# Patient Record
Sex: Female | Born: 1975 | Race: White | Hispanic: No | Marital: Married | Smoking: Never smoker
Health system: Southern US, Community
[De-identification: ages and names within clinical notes are randomized; demographics above are authoritative.]

## PROBLEM LIST (undated history)

## (undated) ENCOUNTER — Inpatient Hospital Stay (HOSPITAL_COMMUNITY): Payer: Self-pay

## (undated) DIAGNOSIS — N189 Chronic kidney disease, unspecified: Secondary | ICD-10-CM

## (undated) DIAGNOSIS — D649 Anemia, unspecified: Secondary | ICD-10-CM

## (undated) DIAGNOSIS — R87619 Unspecified abnormal cytological findings in specimens from cervix uteri: Secondary | ICD-10-CM

## (undated) DIAGNOSIS — T7840XA Allergy, unspecified, initial encounter: Secondary | ICD-10-CM

## (undated) DIAGNOSIS — N390 Urinary tract infection, site not specified: Secondary | ICD-10-CM

## (undated) DIAGNOSIS — R51 Headache: Secondary | ICD-10-CM

## (undated) DIAGNOSIS — M1990 Unspecified osteoarthritis, unspecified site: Secondary | ICD-10-CM

## (undated) DIAGNOSIS — R0902 Hypoxemia: Secondary | ICD-10-CM

## (undated) DIAGNOSIS — R569 Unspecified convulsions: Secondary | ICD-10-CM

## (undated) DIAGNOSIS — J45909 Unspecified asthma, uncomplicated: Secondary | ICD-10-CM

## (undated) DIAGNOSIS — R112 Nausea with vomiting, unspecified: Secondary | ICD-10-CM

## (undated) DIAGNOSIS — IMO0002 Reserved for concepts with insufficient information to code with codable children: Secondary | ICD-10-CM

## (undated) DIAGNOSIS — F419 Anxiety disorder, unspecified: Secondary | ICD-10-CM

## (undated) DIAGNOSIS — F32A Depression, unspecified: Secondary | ICD-10-CM

## (undated) DIAGNOSIS — K219 Gastro-esophageal reflux disease without esophagitis: Secondary | ICD-10-CM

## (undated) DIAGNOSIS — B2790 Infectious mononucleosis, unspecified without complication: Secondary | ICD-10-CM

## (undated) DIAGNOSIS — F329 Major depressive disorder, single episode, unspecified: Secondary | ICD-10-CM

## (undated) DIAGNOSIS — Z9889 Other specified postprocedural states: Secondary | ICD-10-CM

## (undated) HISTORY — DX: Anxiety disorder, unspecified: F41.9

## (undated) HISTORY — DX: Urinary tract infection, site not specified: N39.0

## (undated) HISTORY — DX: Unspecified convulsions: R56.9

## (undated) HISTORY — DX: Anemia, unspecified: D64.9

## (undated) HISTORY — DX: Allergy, unspecified, initial encounter: T78.40XA

## (undated) HISTORY — DX: Chronic kidney disease, unspecified: N18.9

## (undated) HISTORY — DX: Infectious mononucleosis, unspecified without complication: B27.90

## (undated) HISTORY — PX: COLPOSCOPY W/ BIOPSY / CURETTAGE: SUR283

## (undated) HISTORY — PX: DILATION AND CURETTAGE OF UTERUS: SHX78

## (undated) HISTORY — DX: Depression, unspecified: F32.A

## (undated) HISTORY — PX: FOOT FRACTURE SURGERY: SHX645

## (undated) HISTORY — PX: HAND SURGERY: SHX662

## (undated) HISTORY — DX: Hypoxemia: R09.02

## (undated) HISTORY — DX: Major depressive disorder, single episode, unspecified: F32.9

## (undated) HISTORY — PX: OTHER SURGICAL HISTORY: SHX169

---

## 1996-11-11 DIAGNOSIS — B2790 Infectious mononucleosis, unspecified without complication: Secondary | ICD-10-CM

## 1996-11-11 HISTORY — DX: Infectious mononucleosis, unspecified without complication: B27.90

## 2005-11-12 ENCOUNTER — Ambulatory Visit: Payer: Self-pay | Admitting: Orthopedic Surgery

## 2008-09-14 ENCOUNTER — Ambulatory Visit: Payer: Self-pay | Admitting: Family Medicine

## 2009-05-31 ENCOUNTER — Ambulatory Visit (HOSPITAL_COMMUNITY): Admission: RE | Admit: 2009-05-31 | Discharge: 2009-05-31 | Payer: Self-pay | Admitting: Obstetrics and Gynecology

## 2009-11-02 IMAGING — RF DG HYSTEROGRAM
4 series · 6 of 6 positions shown · IV contrast (omnipaque)
Comparison: none

CLINICAL DATA: Evaluate tubal patency

HYSTEROSALPINGOGRAM
TECHNIQUE: Following cleansing of the cervix and vagina with
Betadine solution, a hysterosalpingogram was performed using a 5-
French hysterosalpingogram catheter and Omnipaque 300 contrast. The
patient tolerated the examination without difficulty. The procedure
and imaging were performed by Dr. Romer without a radiologist
present.
Fluoroscopy time: 0.6 minutes.

[Series 1: run · 3 of 3 slices shown (1 of 4)]
[im 1/3]
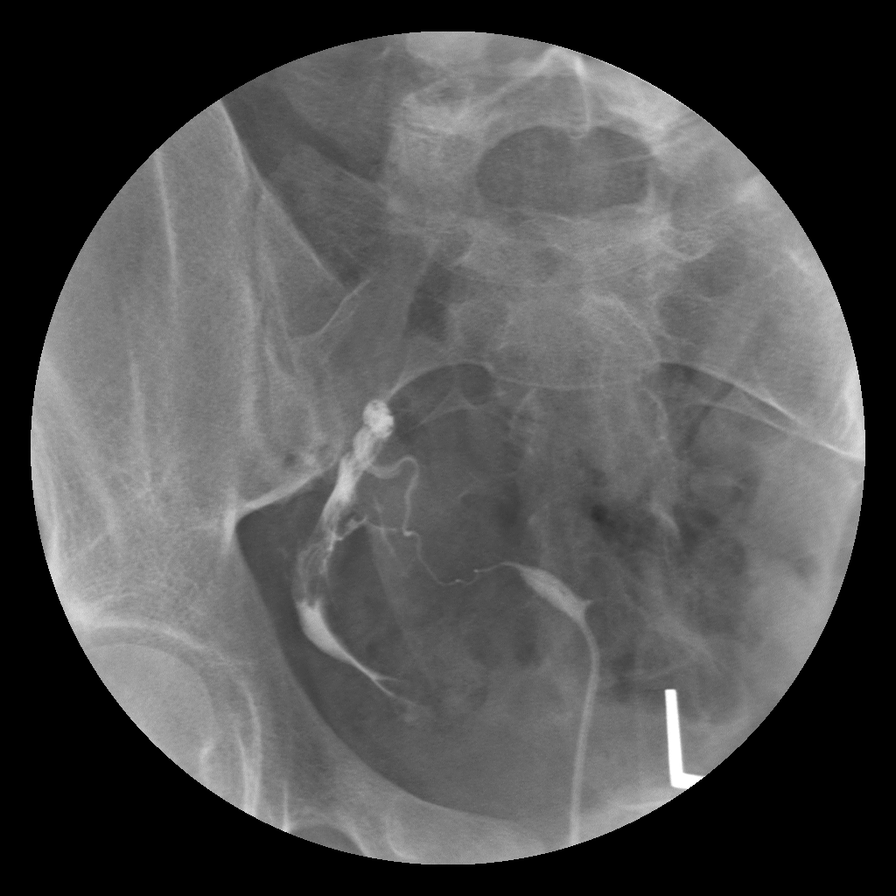
[im 2/3]
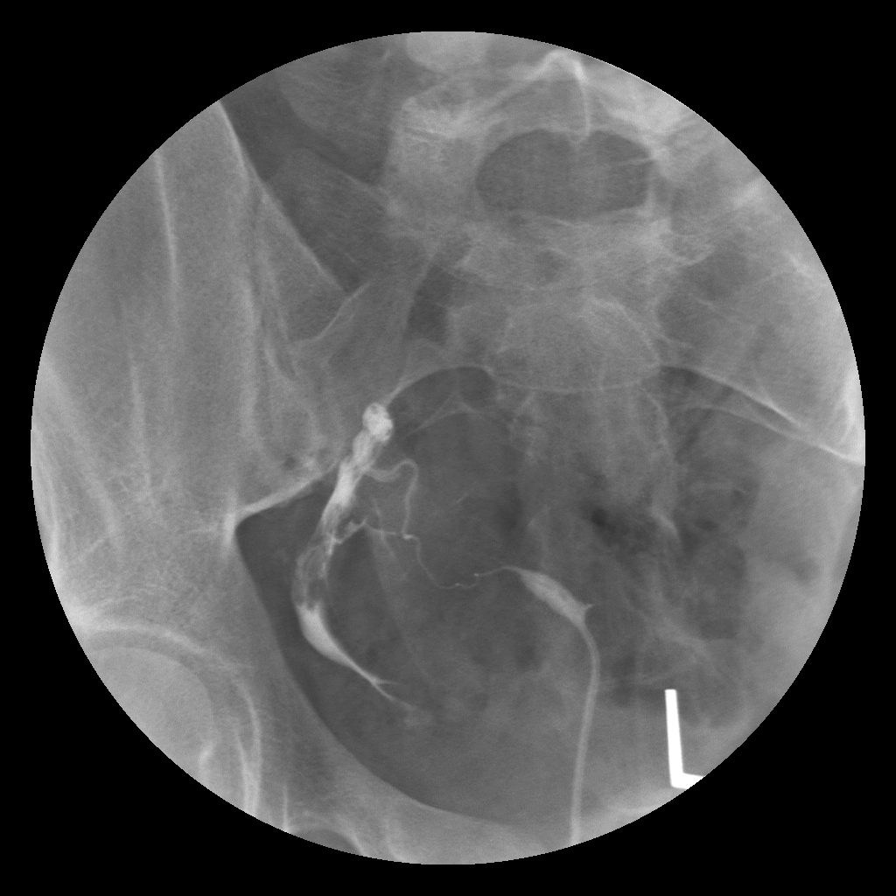
[im 3/3]
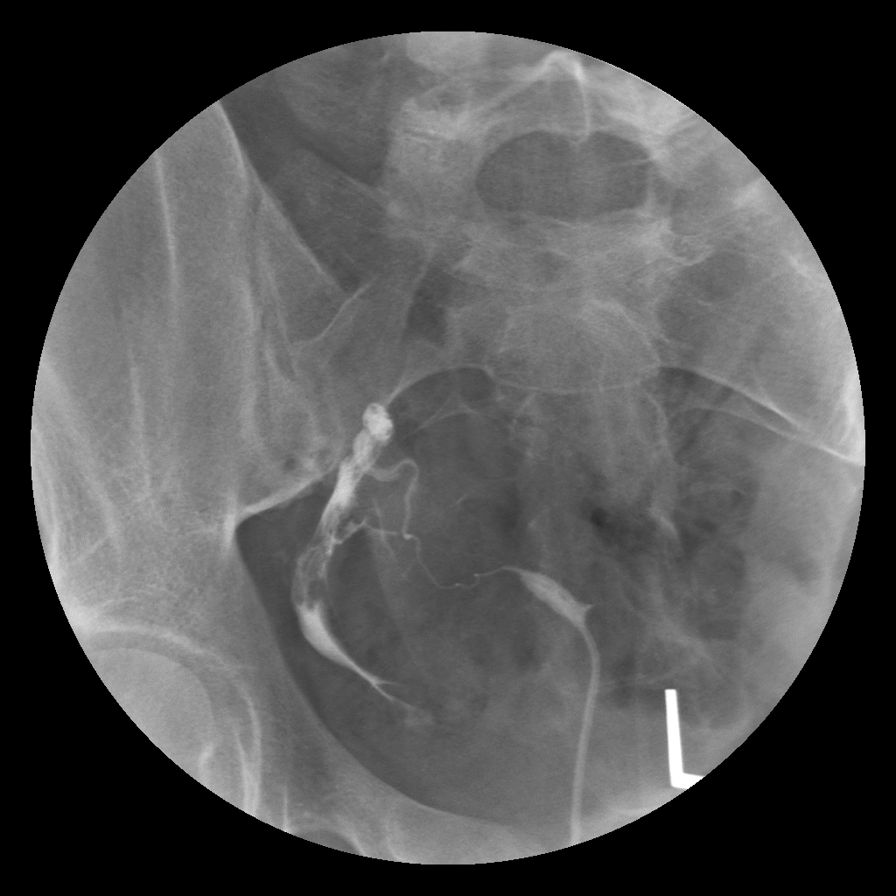

[Series 2: run · 1 of 1 slices shown (2 of 4)]
[im 1/1]
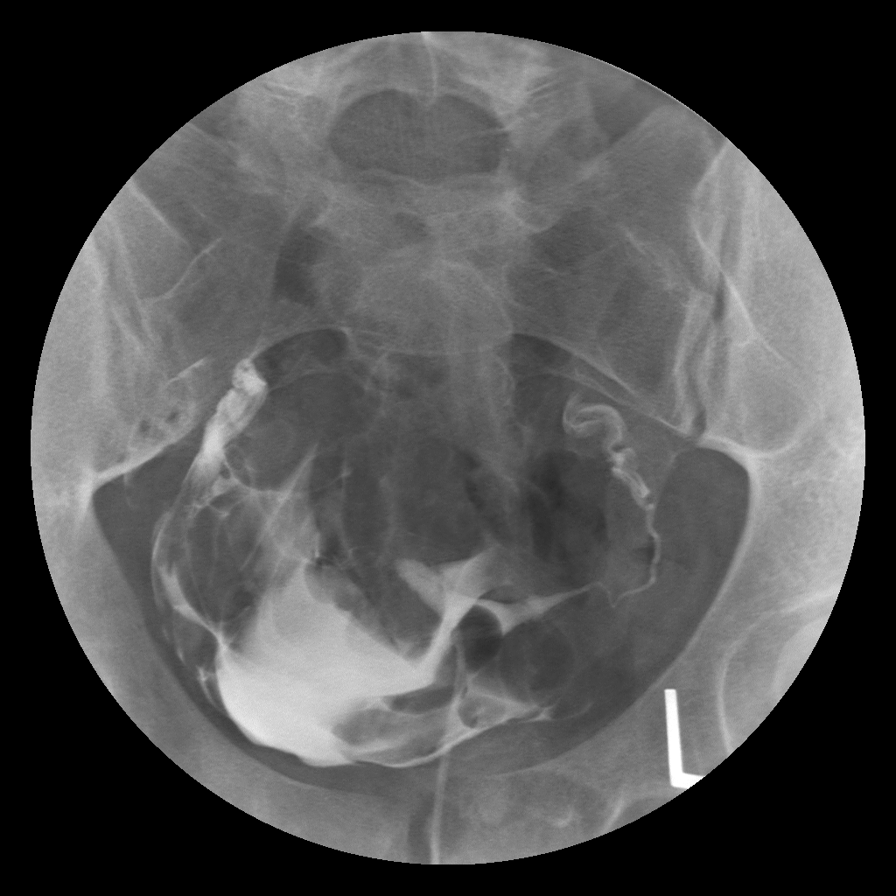

[Series 3: run · 1 of 1 slices shown (3 of 4)]
[im 1/1]
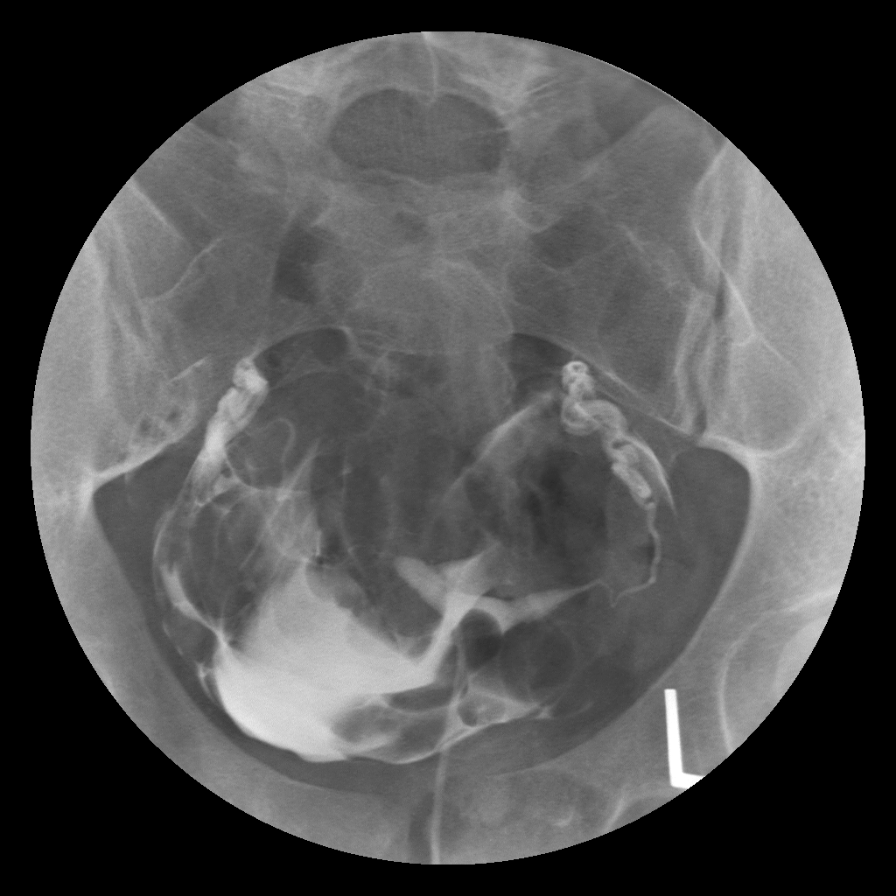

[Series 4: run · 1 of 1 slices shown (4 of 4)]
[im 1/1]
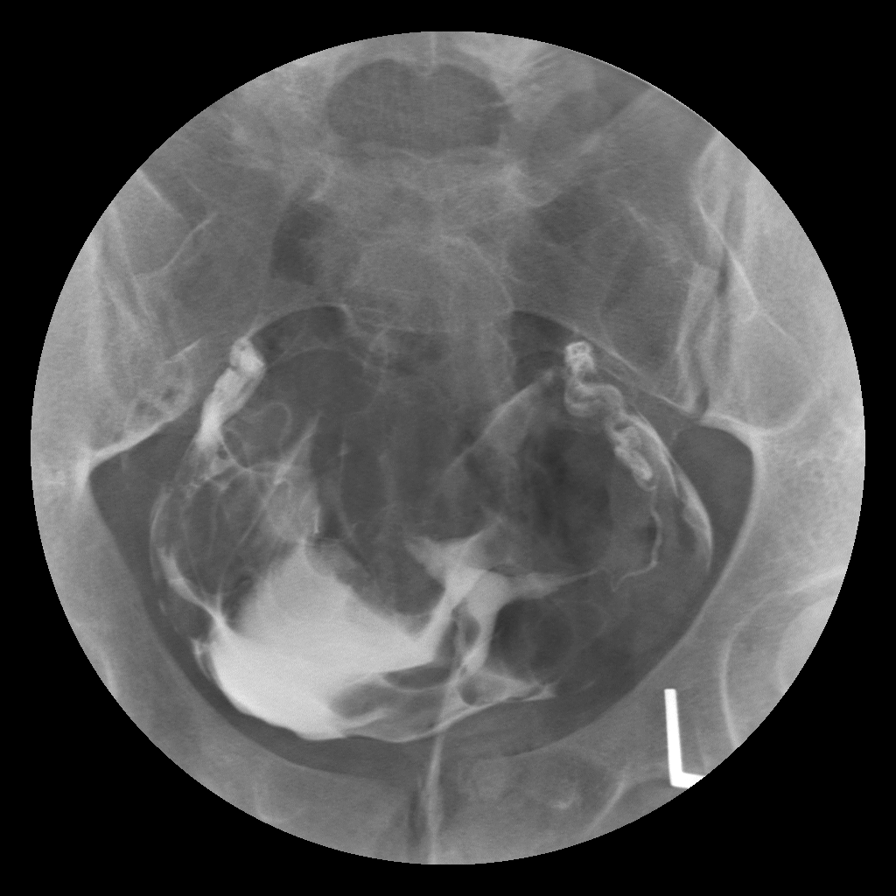

[6 of 6 positions shown; findings below may reference images not displayed]

FINDINGS: The right fallopian tube is normal in course and caliber
with free intraperitoneal spill noted on the right.  On subsequent
images, there is apparent free intraperitoneal spill of contrast
adjacent to the normal caliber left fallopian tube.  The
endometrial contour is normal.
IMPRESSION: Apparent bilateral tubal patency.

## 2009-12-05 ENCOUNTER — Ambulatory Visit: Payer: Self-pay | Admitting: Family Medicine

## 2009-12-30 ENCOUNTER — Ambulatory Visit: Payer: Self-pay | Admitting: Unknown Physician Specialty

## 2010-09-20 ENCOUNTER — Ambulatory Visit: Payer: Self-pay | Admitting: Family Medicine

## 2013-03-14 ENCOUNTER — Emergency Department (INDEPENDENT_AMBULATORY_CARE_PROVIDER_SITE_OTHER): Payer: BC Managed Care – PPO

## 2013-03-14 ENCOUNTER — Emergency Department (HOSPITAL_COMMUNITY)
Admission: EM | Admit: 2013-03-14 | Discharge: 2013-03-14 | Disposition: A | Discharge status: Home / Self Care | Payer: BC Managed Care – PPO | Source: Home / Self Care | Attending: Family Medicine | Admitting: Family Medicine

## 2013-03-14 ENCOUNTER — Encounter (HOSPITAL_COMMUNITY): Payer: Self-pay | Admitting: *Deleted

## 2013-03-14 DIAGNOSIS — S60222A Contusion of left hand, initial encounter: Secondary | ICD-10-CM

## 2013-03-14 DIAGNOSIS — S60229A Contusion of unspecified hand, initial encounter: Secondary | ICD-10-CM

## 2013-03-14 MED ORDER — DICLOFENAC POTASSIUM 50 MG PO TABS: 50.0000 mg | ORAL_TABLET | Freq: Three times a day (TID) | ORAL | Status: DC

## 2013-03-14 NOTE — ED Notes (Signed)
Patient states she hurt her left hand at [employer] last night while putting on a sweater.

## 2013-03-14 NOTE — ED Provider Notes (Signed)
History     CSN: [id number]  Arrival date & time 03/14/13  1407   First MD Initiated Contact with Patient 03/14/13 1446      Chief Complaint  Patient presents with  . Hand Injury    (Consider location/radiation/quality/duration/timing/severity/associated sxs/prior treatment) Patient is a 37 y.o. female presenting with hand injury. The history is provided by the patient.  Hand Injury Location:  Hand Time since incident:  1 day Injury: yes   Mechanism of injury comment:  Struck hand against bench while putting on sweater  Hand location:  L hand Pain details:    Quality:  Burning   Severity:  Mild   Progression:  Unchanged Dislocation: no   Relieved by:  Nothing   History reviewed. No pertinent past medical history.  History reviewed. No pertinent past surgical history.  No family history on file.  History  Substance Use Topics  . Smoking status: Never Smoker   . Smokeless tobacco: Not on file  . Alcohol Use: No    OB History   Grav Para Term Preterm Abortions TAB SAB Ect Mult Living   1               Review of Systems  Constitutional: Negative.   Musculoskeletal: Negative.  Negative for joint swelling.  Skin: Negative.     Allergies  Review of patient's allergies indicates no known allergies.  Home Medications   Current Outpatient Rx  Name  Route  Sig  Dispense  Refill  . cephALEXin (KEFLEX) 250 MG capsule   Oral   Take 250 mg by mouth 4 (four) times daily.         Marland Kitchen estradiol (ESTRACE) 2 MG tablet   Oral   Take 2 mg by mouth daily.         Marland Kitchen estradiol (VIVELLE-DOT) 0.1 MG/24HR   Transdermal   Place 1 patch onto the skin 2 (two) times a week.         . lansoprazole (PREVACID) 15 MG capsule   Oral   Take 15 mg by mouth daily.         . metFORMIN (GLUCOPHAGE) 500 MG tablet   Oral   Take 500 mg by mouth 3 (three) times daily.         . diclofenac (CATAFLAM) 50 MG tablet   Oral   Take 1 tablet (50 mg total) by mouth 3 (three)  times daily. Prn pain   21 tablet   0     BP 134/83  Pulse 94  Temp(Src) 98.5 F (36.9 C) (Oral)  Resp 19  SpO2 100%  Physical Exam  Nursing note and vitals reviewed. Constitutional: She is oriented to person, place, and time. She appears well-developed and well-nourished.  Musculoskeletal: She exhibits tenderness.       Hands: Neurological: She is alert and oriented to person, place, and time.  Skin: Skin is warm and dry.    ED Course  Procedures (including critical care time)  Labs Reviewed - No data to display Dg Hand Complete Left  03/14/2013  *RADIOLOGY REPORT*  Clinical Data: Left hand pain after trauma  LEFT HAND - COMPLETE 3+ VIEW  Comparison: None.  Findings: No fracture or dislocation is noted.  Joint spaces are intact. No soft tissue abnormality is noted.  IMPRESSION: Normal left hand.   Original Report Authenticated By: Lupita Raider.,  M.D.      1. Hand contusion, left, initial encounter       MDM  Linna Hoff, MD 03/14/13 [phone]

## 2013-04-19 LAB — OB RESULTS CONSOLE ABO/RH: RH Type: POSITIVE

## 2013-04-19 LAB — OB RESULTS CONSOLE HIV ANTIBODY (ROUTINE TESTING): HIV: NONREACTIVE

## 2013-06-01 ENCOUNTER — Encounter (HOSPITAL_COMMUNITY): Payer: Self-pay | Admitting: Obstetrics and Gynecology

## 2013-06-07 ENCOUNTER — Other Ambulatory Visit (HOSPITAL_COMMUNITY): Payer: Self-pay | Admitting: Obstetrics and Gynecology

## 2013-06-07 DIAGNOSIS — O289 Unspecified abnormal findings on antenatal screening of mother: Secondary | ICD-10-CM

## 2013-06-09 ENCOUNTER — Ambulatory Visit (HOSPITAL_COMMUNITY)
Admission: RE | Admit: 2013-06-09 | Discharge: 2013-06-09 | Disposition: A | Discharge status: Home / Self Care | Payer: BC Managed Care – PPO | Source: Ambulatory Visit | Attending: Obstetrics and Gynecology | Admitting: Obstetrics and Gynecology

## 2013-06-09 ENCOUNTER — Ambulatory Visit (HOSPITAL_COMMUNITY): Admission: RE | Admit: 2013-06-09 | Payer: BC Managed Care – PPO | Source: Ambulatory Visit

## 2013-06-09 ENCOUNTER — Other Ambulatory Visit: Payer: Self-pay

## 2013-06-09 ENCOUNTER — Encounter (HOSPITAL_COMMUNITY): Payer: Self-pay

## 2013-06-09 ENCOUNTER — Other Ambulatory Visit (HOSPITAL_COMMUNITY): Payer: Self-pay | Admitting: Obstetrics and Gynecology

## 2013-06-09 DIAGNOSIS — O280 Abnormal hematological finding on antenatal screening of mother: Secondary | ICD-10-CM

## 2013-06-09 DIAGNOSIS — O09529 Supervision of elderly multigravida, unspecified trimester: Secondary | ICD-10-CM | POA: Insufficient documentation

## 2013-06-09 DIAGNOSIS — IMO0002 Reserved for concepts with insufficient information to code with codable children: Secondary | ICD-10-CM | POA: Insufficient documentation

## 2013-06-09 DIAGNOSIS — Z1389 Encounter for screening for other disorder: Secondary | ICD-10-CM | POA: Insufficient documentation

## 2013-06-09 DIAGNOSIS — O3500X0 Maternal care for (suspected) central nervous system malformation or damage in fetus, unspecified, not applicable or unspecified: Secondary | ICD-10-CM | POA: Insufficient documentation

## 2013-06-09 DIAGNOSIS — O289 Unspecified abnormal findings on antenatal screening of mother: Secondary | ICD-10-CM

## 2013-06-09 DIAGNOSIS — O99210 Obesity complicating pregnancy, unspecified trimester: Secondary | ICD-10-CM | POA: Insufficient documentation

## 2013-06-09 DIAGNOSIS — E669 Obesity, unspecified: Secondary | ICD-10-CM | POA: Insufficient documentation

## 2013-06-09 DIAGNOSIS — O358XX0 Maternal care for other (suspected) fetal abnormality and damage, not applicable or unspecified: Secondary | ICD-10-CM | POA: Insufficient documentation

## 2013-06-09 DIAGNOSIS — O350XX0 Maternal care for (suspected) central nervous system malformation in fetus, not applicable or unspecified: Secondary | ICD-10-CM | POA: Insufficient documentation

## 2013-06-09 DIAGNOSIS — Z363 Encounter for antenatal screening for malformations: Secondary | ICD-10-CM | POA: Insufficient documentation

## 2013-06-10 ENCOUNTER — Other Ambulatory Visit: Payer: Self-pay

## 2013-06-18 ENCOUNTER — Telehealth (HOSPITAL_COMMUNITY): Payer: Self-pay | Admitting: MS"

## 2013-06-18 NOTE — Telephone Encounter (Signed)
Called Carrie [name] to discuss her cell free fetal DNA test results.  Mrs. Carrie [name] had Panorama testing through [facility].  Testing was offered because of advanced maternal age and previous discrepant NIPS result (from a different laboratory) with ultrasound findings.   The patient was identified by name and DOB.  We reviewed that these are within normal limits, showing a less than 1 in 10,000 risk for trisomies 21, 18 and 13, and monosomy X (Turner syndrome).  In addition, the risk for triploidy/vanishing twin and sex chromosome trisomies (47,XXX and 47,XXY) was also low risk.  We reviewed that this testing identifies > 99% of pregnancies with trisomy 86, trisomy 74, trisomy 25, sex chromosome trisomies (47,XXX and 47,XXY), and triploidy.  The detection rate for monosomy X is ~92%.  The false positive rate is <0.1% for all conditions. Testing was also consistent with female gender, which was consistent with the patient's ultrasound findings at the time of her appointment last week.  The patient did wish to know gender.  She understands that this testing does not identify all genetic conditions.  All questions were answered to her satisfaction, she was encouraged to call with additional questions or concerns.  Quinn Plowman, MS Certified Genetic Counselor 06/18/2013 4:42 PM

## 2013-06-21 ENCOUNTER — Other Ambulatory Visit (HOSPITAL_COMMUNITY): Payer: Self-pay | Admitting: Obstetrics and Gynecology

## 2013-06-21 DIAGNOSIS — O09529 Supervision of elderly multigravida, unspecified trimester: Secondary | ICD-10-CM

## 2013-06-21 DIAGNOSIS — O280 Abnormal hematological finding on antenatal screening of mother: Secondary | ICD-10-CM

## 2013-06-22 ENCOUNTER — Ambulatory Visit (HOSPITAL_COMMUNITY)
Admission: RE | Admit: 2013-06-22 | Discharge: 2013-06-22 | Disposition: A | Discharge status: Home / Self Care | Payer: BC Managed Care – PPO | Source: Ambulatory Visit | Attending: Obstetrics and Gynecology | Admitting: Obstetrics and Gynecology

## 2013-06-22 ENCOUNTER — Encounter (HOSPITAL_COMMUNITY): Payer: Self-pay

## 2013-06-22 ENCOUNTER — Ambulatory Visit (HOSPITAL_COMMUNITY): Payer: BC Managed Care – PPO

## 2013-06-22 DIAGNOSIS — IMO0002 Reserved for concepts with insufficient information to code with codable children: Secondary | ICD-10-CM | POA: Insufficient documentation

## 2013-06-22 DIAGNOSIS — O280 Abnormal hematological finding on antenatal screening of mother: Secondary | ICD-10-CM

## 2013-06-22 DIAGNOSIS — O99210 Obesity complicating pregnancy, unspecified trimester: Secondary | ICD-10-CM | POA: Insufficient documentation

## 2013-06-22 DIAGNOSIS — Z3689 Encounter for other specified antenatal screening: Secondary | ICD-10-CM | POA: Insufficient documentation

## 2013-06-22 DIAGNOSIS — O350XX0 Maternal care for (suspected) central nervous system malformation in fetus, not applicable or unspecified: Secondary | ICD-10-CM | POA: Insufficient documentation

## 2013-06-22 DIAGNOSIS — E669 Obesity, unspecified: Secondary | ICD-10-CM | POA: Insufficient documentation

## 2013-06-22 DIAGNOSIS — O09529 Supervision of elderly multigravida, unspecified trimester: Secondary | ICD-10-CM | POA: Insufficient documentation

## 2013-06-22 DIAGNOSIS — O3500X0 Maternal care for (suspected) central nervous system malformation or damage in fetus, unspecified, not applicable or unspecified: Secondary | ICD-10-CM | POA: Insufficient documentation

## 2013-09-07 ENCOUNTER — Inpatient Hospital Stay (HOSPITAL_COMMUNITY)
Admission: AD | Admit: 2013-09-07 | Discharge: 2013-09-07 | Disposition: A | Discharge status: Home / Self Care | Payer: BC Managed Care – PPO | Source: Ambulatory Visit | Attending: Obstetrics and Gynecology | Admitting: Obstetrics and Gynecology

## 2013-09-07 DIAGNOSIS — O4700 False labor before 37 completed weeks of gestation, unspecified trimester: Secondary | ICD-10-CM | POA: Insufficient documentation

## 2013-09-07 MED ORDER — BETAMETHASONE SOD PHOS & ACET 6 (3-3) MG/ML IJ SUSP
12.0000 mg | Freq: Once | INTRAMUSCULAR | Status: AC
  Administered 2013-09-07: 12 mg via INTRAMUSCULAR
  Filled 2013-09-07: qty 2

## 2013-09-08 ENCOUNTER — Inpatient Hospital Stay (HOSPITAL_COMMUNITY)
Admission: AD | Admit: 2013-09-08 | Discharge: 2013-09-08 | Disposition: A | Discharge status: Home / Self Care | Payer: BC Managed Care – PPO | Source: Ambulatory Visit | Attending: Obstetrics and Gynecology | Admitting: Obstetrics and Gynecology

## 2013-09-08 ENCOUNTER — Encounter (HOSPITAL_COMMUNITY): Payer: Self-pay | Admitting: *Deleted

## 2013-09-08 DIAGNOSIS — O4700 False labor before 37 completed weeks of gestation, unspecified trimester: Secondary | ICD-10-CM | POA: Insufficient documentation

## 2013-09-08 MED ORDER — BETAMETHASONE SOD PHOS & ACET 6 (3-3) MG/ML IJ SUSP
12.0000 mg | Freq: Once | INTRAMUSCULAR | Status: AC
  Administered 2013-09-08: 12 mg via INTRAMUSCULAR
  Filled 2013-09-08: qty 2

## 2013-09-13 ENCOUNTER — Encounter (HOSPITAL_COMMUNITY): Payer: Self-pay | Admitting: *Deleted

## 2013-09-13 ENCOUNTER — Other Ambulatory Visit (HOSPITAL_COMMUNITY): Payer: Self-pay | Admitting: Obstetrics and Gynecology

## 2013-09-13 ENCOUNTER — Inpatient Hospital Stay (HOSPITAL_COMMUNITY)
Admission: AD | Admit: 2013-09-13 | Discharge: 2013-09-13 | Disposition: A | Discharge status: Home / Self Care | Payer: BC Managed Care – PPO | Source: Ambulatory Visit | Attending: Obstetrics and Gynecology | Admitting: Obstetrics and Gynecology

## 2013-09-13 DIAGNOSIS — N949 Unspecified condition associated with female genital organs and menstrual cycle: Secondary | ICD-10-CM | POA: Insufficient documentation

## 2013-09-13 DIAGNOSIS — R102 Pelvic and perineal pain: Secondary | ICD-10-CM

## 2013-09-13 DIAGNOSIS — R109 Unspecified abdominal pain: Secondary | ICD-10-CM | POA: Insufficient documentation

## 2013-09-13 DIAGNOSIS — O09529 Supervision of elderly multigravida, unspecified trimester: Secondary | ICD-10-CM

## 2013-09-13 DIAGNOSIS — O26879 Cervical shortening, unspecified trimester: Secondary | ICD-10-CM | POA: Insufficient documentation

## 2013-09-13 DIAGNOSIS — O99891 Other specified diseases and conditions complicating pregnancy: Secondary | ICD-10-CM | POA: Insufficient documentation

## 2013-09-13 HISTORY — DX: Unspecified asthma, uncomplicated: J45.909

## 2013-09-13 HISTORY — DX: Unspecified osteoarthritis, unspecified site: M19.90

## 2013-09-13 HISTORY — DX: Reserved for concepts with insufficient information to code with codable children: IMO0002

## 2013-09-13 HISTORY — DX: Headache: R51

## 2013-09-13 HISTORY — DX: Unspecified abnormal cytological findings in specimens from cervix uteri: R87.619

## 2013-09-13 LAB — URINALYSIS, ROUTINE W REFLEX MICROSCOPIC
Bilirubin Urine: NEGATIVE
Glucose, UA: NEGATIVE mg/dL
Ketones, ur: 15 mg/dL — AB
Leukocytes, UA: NEGATIVE
Nitrite: NEGATIVE
Protein, ur: NEGATIVE mg/dL
Specific Gravity, Urine: 1.015 (ref 1.005–1.030)
Urobilinogen, UA: 0.2 mg/dL (ref 0.0–1.0)
pH: 6 (ref 5.0–8.0)

## 2013-09-13 LAB — URINE MICROSCOPIC-ADD ON

## 2013-09-13 LAB — WET PREP, GENITAL
Trich, Wet Prep: NONE SEEN
Yeast Wet Prep HPF POC: NONE SEEN

## 2013-09-13 NOTE — MAU Provider Note (Signed)
History     CSN: [id number]  Arrival date and time: 09/13/13 1733   First Provider Initiated Contact with Patient 09/13/13 1915      Chief Complaint  Patient presents with  . Abdominal Pain  . Vaginal Pain   HPI  Carrie [name] is 37 y.o. G2P0010 [redacted]w[redacted]d weeks presenting with sharp shooting vaginal pain on and off since 2 pm today.  Yesterday had upper right abdominal pain and continues today.  Denies nausea, vomiting, vaginal discharge or bleeding.  Last intercourse a while ago.   IVF cycle.  Patient of Dr. Marcelle Overlie, last Tuesday.  Saw Dr. Langston Masker last week and had FFN last week that was positive and negative AFFIRM.  Had U/S that showed shortened cervix.  Has appt in office tomorrow and one at MFM to evaluate head circumference.  U/S in office showed abdominal lag compared to head growth, normal AFI per patient report.    Past Medical History  Diagnosis Date  . Medical history non-contributory   . Asthma   . Headache(784.0)   . Abnormal Pap smear   . Arthritis     t-12    Past Surgical History  Procedure Laterality Date  . Colposcopy w/ biopsy / curettage    . Ivf      Family History  Problem Relation Age of Onset  . Arthritis Mother   . Diverticulitis Mother   . Arthritis Father   . Hypertension Father   . Diabetes Maternal Grandmother   . Heart disease Paternal Grandfather     History  Substance Use Topics  . Smoking status: Never Smoker   . Smokeless tobacco: Not on file  . Alcohol Use: No     Comment: not while pregnant    Allergies: No Known Allergies  Prescriptions prior to admission  Medication Sig Dispense Refill  . acetaminophen (TYLENOL) 500 MG tablet Take 1,000 mg by mouth every 6 (six) hours as needed for pain.      Marland Kitchen lansoprazole (PREVACID) 15 MG capsule Take 15 mg by mouth daily.      Marland Kitchen loratadine (CLARITIN) 10 MG tablet Take 10 mg by mouth daily.      . Prenatal Vit-Fe Fumarate-FA (PRENATAL MULTIVITAMIN) TABS tablet Take 1 tablet by mouth at  bedtime.         Review of Systems  Constitutional: Negative for fever and chills.  Gastrointestinal: Positive for abdominal pain (upper right abdominal pain). Negative for nausea and vomiting.  Genitourinary:       Vaginal pain  Neurological: Positive for headaches.   Physical Exam   Blood pressure 133/74, pulse 118, temperature 98.7 F (37.1 C), temperature source Oral, resp. rate 18, last menstrual period 02/07/2013.  Physical Exam  Constitutional: She is oriented to person, place, and time. She appears well-developed and well-nourished. No distress.  Genitourinary:  Cervical exam by Baxter Hire, RN  External os is 1 cm and the internal os is closed.    Neurological: She is alert and oriented to person, place, and time.  Psychiatric: She has a normal mood and affect. Her behavior is normal.   Results for orders placed during the hospital encounter of 09/13/13 (from the past 24 hour(s))  URINALYSIS, ROUTINE W REFLEX MICROSCOPIC     Status: Abnormal   Collection Time    09/13/13  5:55 PM      Result Value Range   Color, Urine YELLOW  YELLOW   APPearance CLEAR  CLEAR   Specific Gravity, Urine 1.015  1.005 -  1.030   pH 6.0  5.0 - 8.0   Glucose, UA NEGATIVE  NEGATIVE mg/dL   Hgb urine dipstick SMALL (*) NEGATIVE   Bilirubin Urine NEGATIVE  NEGATIVE   Ketones, ur 15 (*) NEGATIVE mg/dL   Protein, ur NEGATIVE  NEGATIVE mg/dL   Urobilinogen, UA 0.2  0.0 - 1.0 mg/dL   Nitrite NEGATIVE  NEGATIVE   Leukocytes, UA NEGATIVE  NEGATIVE  URINE MICROSCOPIC-ADD ON     Status: Abnormal   Collection Time    09/13/13  5:55 PM      Result Value Range   Squamous Epithelial / LPF FEW (*) RARE   WBC, UA 0-2  <3 WBC/hpf   RBC / HPF 3-6  <3 RBC/hpf   Bacteria, UA FEW (*) RARE  WET PREP, GENITAL     Status: Abnormal   Collection Time    09/13/13  7:46 PM      Result Value Range   Yeast Wet Prep HPF POC NONE SEEN  NONE SEEN   Trich, Wet Prep NONE SEEN  NONE SEEN   Clue Cells Wet Prep HPF POC  NONE SEEN  NONE SEEN   WBC, Wet Prep HPF POC FEW (*) NONE SEEN   MAU Course  Procedures  FMS-baseline 140, moderate variability, reactive  Neg for contractions  MDM Reported MSE, FMS to Dr. Rana Snare.  Order given to check her cervix and if needed a wet prep.   20:00 Care turned over to J. Ethier, PA Discussed cervical exam and wet prep results with Dr. Rana Snare. Ok for discharge. Increase PO hydration and follow-up as scheduled.  Assessment and Plan  A:  Vaginal pain at [redacted]w[redacted]d gestation       P: Discharge home Patient advised to increase PO hydration Patient encouraged to follow-up with [facility] as scheduled tomorrow Patient may return to MAU as needed or if her condition were to change or worsen    ETHIER, JULIE N. 09/13/2013, 8:15 PM   Freddi Starr, PA-C 09/13/2013 8:15 PM

## 2013-09-13 NOTE — MAU Note (Signed)
Pressure in RUQ, started yesterday. Today had sharp shooting pain in the vagina, last about , go away and then come back.

## 2013-09-14 ENCOUNTER — Other Ambulatory Visit (HOSPITAL_COMMUNITY): Payer: Self-pay | Admitting: Obstetrics and Gynecology

## 2013-09-14 ENCOUNTER — Ambulatory Visit (HOSPITAL_COMMUNITY)
Admission: RE | Admit: 2013-09-14 | Discharge: 2013-09-14 | Disposition: A | Discharge status: Home / Self Care | Payer: BC Managed Care – PPO | Source: Ambulatory Visit | Attending: Obstetrics and Gynecology | Admitting: Obstetrics and Gynecology

## 2013-09-14 VITALS — BP 146/89 | HR 119 | Wt 225.0 lb

## 2013-09-14 DIAGNOSIS — O3500X0 Maternal care for (suspected) central nervous system malformation or damage in fetus, unspecified, not applicable or unspecified: Secondary | ICD-10-CM | POA: Insufficient documentation

## 2013-09-14 DIAGNOSIS — O350XX0 Maternal care for (suspected) central nervous system malformation in fetus, not applicable or unspecified: Secondary | ICD-10-CM | POA: Insufficient documentation

## 2013-09-14 DIAGNOSIS — IMO0002 Reserved for concepts with insufficient information to code with codable children: Secondary | ICD-10-CM | POA: Insufficient documentation

## 2013-09-14 DIAGNOSIS — E669 Obesity, unspecified: Secondary | ICD-10-CM | POA: Insufficient documentation

## 2013-09-14 DIAGNOSIS — O09529 Supervision of elderly multigravida, unspecified trimester: Secondary | ICD-10-CM | POA: Insufficient documentation

## 2013-09-14 DIAGNOSIS — Z3689 Encounter for other specified antenatal screening: Secondary | ICD-10-CM | POA: Insufficient documentation

## 2013-09-24 ENCOUNTER — Ambulatory Visit (HOSPITAL_COMMUNITY)
Admission: RE | Admit: 2013-09-24 | Discharge: 2013-09-24 | Disposition: A | Discharge status: Home / Self Care | Payer: BC Managed Care – PPO | Source: Ambulatory Visit | Attending: Obstetrics and Gynecology | Admitting: Obstetrics and Gynecology

## 2013-09-24 ENCOUNTER — Other Ambulatory Visit (HOSPITAL_COMMUNITY): Payer: Self-pay | Admitting: Maternal and Fetal Medicine

## 2013-09-24 DIAGNOSIS — O3500X0 Maternal care for (suspected) central nervous system malformation or damage in fetus, unspecified, not applicable or unspecified: Secondary | ICD-10-CM | POA: Insufficient documentation

## 2013-09-24 DIAGNOSIS — O350XX0 Maternal care for (suspected) central nervous system malformation in fetus, not applicable or unspecified: Secondary | ICD-10-CM | POA: Insufficient documentation

## 2013-09-24 DIAGNOSIS — E669 Obesity, unspecified: Secondary | ICD-10-CM | POA: Insufficient documentation

## 2013-09-24 DIAGNOSIS — O09529 Supervision of elderly multigravida, unspecified trimester: Secondary | ICD-10-CM

## 2013-09-24 DIAGNOSIS — O365990 Maternal care for other known or suspected poor fetal growth, unspecified trimester, not applicable or unspecified: Secondary | ICD-10-CM | POA: Insufficient documentation

## 2013-09-24 DIAGNOSIS — IMO0002 Reserved for concepts with insufficient information to code with codable children: Secondary | ICD-10-CM | POA: Insufficient documentation

## 2013-09-24 NOTE — Progress Notes (Signed)
Toco/US applied for NST. 

## 2013-10-01 ENCOUNTER — Ambulatory Visit (HOSPITAL_COMMUNITY)
Admission: RE | Admit: 2013-10-01 | Discharge: 2013-10-01 | Disposition: A | Discharge status: Home / Self Care | Payer: BC Managed Care – PPO | Source: Ambulatory Visit | Attending: Obstetrics and Gynecology | Admitting: Obstetrics and Gynecology

## 2013-10-01 ENCOUNTER — Other Ambulatory Visit (HOSPITAL_COMMUNITY): Payer: Self-pay | Admitting: Maternal and Fetal Medicine

## 2013-10-01 DIAGNOSIS — O09529 Supervision of elderly multigravida, unspecified trimester: Secondary | ICD-10-CM | POA: Insufficient documentation

## 2013-10-01 DIAGNOSIS — O3500X0 Maternal care for (suspected) central nervous system malformation or damage in fetus, unspecified, not applicable or unspecified: Secondary | ICD-10-CM | POA: Insufficient documentation

## 2013-10-01 DIAGNOSIS — IMO0002 Reserved for concepts with insufficient information to code with codable children: Secondary | ICD-10-CM | POA: Insufficient documentation

## 2013-10-01 DIAGNOSIS — O365990 Maternal care for other known or suspected poor fetal growth, unspecified trimester, not applicable or unspecified: Secondary | ICD-10-CM | POA: Insufficient documentation

## 2013-10-01 DIAGNOSIS — E669 Obesity, unspecified: Secondary | ICD-10-CM | POA: Insufficient documentation

## 2013-10-01 DIAGNOSIS — O350XX0 Maternal care for (suspected) central nervous system malformation in fetus, not applicable or unspecified: Secondary | ICD-10-CM | POA: Insufficient documentation

## 2013-10-04 ENCOUNTER — Other Ambulatory Visit (HOSPITAL_COMMUNITY): Payer: Self-pay | Admitting: Maternal and Fetal Medicine

## 2013-10-04 DIAGNOSIS — O09529 Supervision of elderly multigravida, unspecified trimester: Secondary | ICD-10-CM

## 2013-10-05 ENCOUNTER — Ambulatory Visit (HOSPITAL_COMMUNITY)
Admission: RE | Admit: 2013-10-05 | Discharge: 2013-10-05 | Disposition: A | Discharge status: Home / Self Care | Payer: BC Managed Care – PPO | Source: Ambulatory Visit | Attending: Obstetrics & Gynecology | Admitting: Obstetrics & Gynecology

## 2013-10-05 DIAGNOSIS — O09529 Supervision of elderly multigravida, unspecified trimester: Secondary | ICD-10-CM | POA: Insufficient documentation

## 2013-10-05 DIAGNOSIS — O350XX0 Maternal care for (suspected) central nervous system malformation in fetus, not applicable or unspecified: Secondary | ICD-10-CM | POA: Insufficient documentation

## 2013-10-05 DIAGNOSIS — O3500X0 Maternal care for (suspected) central nervous system malformation or damage in fetus, unspecified, not applicable or unspecified: Secondary | ICD-10-CM | POA: Insufficient documentation

## 2013-10-05 DIAGNOSIS — IMO0002 Reserved for concepts with insufficient information to code with codable children: Secondary | ICD-10-CM | POA: Insufficient documentation

## 2013-10-05 DIAGNOSIS — E669 Obesity, unspecified: Secondary | ICD-10-CM | POA: Insufficient documentation

## 2013-10-05 DIAGNOSIS — O365990 Maternal care for other known or suspected poor fetal growth, unspecified trimester, not applicable or unspecified: Secondary | ICD-10-CM | POA: Insufficient documentation

## 2013-10-15 ENCOUNTER — Ambulatory Visit (HOSPITAL_COMMUNITY): Payer: BC Managed Care – PPO

## 2013-10-15 ENCOUNTER — Ambulatory Visit (HOSPITAL_COMMUNITY)
Admission: RE | Admit: 2013-10-15 | Discharge: 2013-10-15 | Disposition: A | Discharge status: Home / Self Care | Payer: BC Managed Care – PPO | Source: Ambulatory Visit | Attending: Maternal and Fetal Medicine | Admitting: Maternal and Fetal Medicine

## 2013-10-15 ENCOUNTER — Other Ambulatory Visit (HOSPITAL_COMMUNITY): Payer: Self-pay | Admitting: Maternal and Fetal Medicine

## 2013-10-15 DIAGNOSIS — O09529 Supervision of elderly multigravida, unspecified trimester: Secondary | ICD-10-CM | POA: Insufficient documentation

## 2013-10-15 DIAGNOSIS — IMO0002 Reserved for concepts with insufficient information to code with codable children: Secondary | ICD-10-CM | POA: Insufficient documentation

## 2013-10-15 DIAGNOSIS — O365990 Maternal care for other known or suspected poor fetal growth, unspecified trimester, not applicable or unspecified: Secondary | ICD-10-CM | POA: Insufficient documentation

## 2013-10-15 DIAGNOSIS — E669 Obesity, unspecified: Secondary | ICD-10-CM | POA: Insufficient documentation

## 2013-10-15 DIAGNOSIS — O3500X0 Maternal care for (suspected) central nervous system malformation or damage in fetus, unspecified, not applicable or unspecified: Secondary | ICD-10-CM | POA: Insufficient documentation

## 2013-10-15 DIAGNOSIS — O350XX0 Maternal care for (suspected) central nervous system malformation in fetus, not applicable or unspecified: Secondary | ICD-10-CM | POA: Insufficient documentation

## 2013-10-21 ENCOUNTER — Other Ambulatory Visit (HOSPITAL_COMMUNITY): Payer: Self-pay | Admitting: Maternal and Fetal Medicine

## 2013-10-21 DIAGNOSIS — O09529 Supervision of elderly multigravida, unspecified trimester: Secondary | ICD-10-CM

## 2013-10-22 ENCOUNTER — Ambulatory Visit (HOSPITAL_COMMUNITY)
Admission: RE | Admit: 2013-10-22 | Discharge: 2013-10-22 | Disposition: A | Discharge status: Home / Self Care | Payer: BC Managed Care – PPO | Source: Ambulatory Visit | Attending: Obstetrics and Gynecology | Admitting: Obstetrics and Gynecology

## 2013-10-22 VITALS — BP 129/88 | HR 123 | Wt 241.0 lb

## 2013-10-22 DIAGNOSIS — O350XX0 Maternal care for (suspected) central nervous system malformation in fetus, not applicable or unspecified: Secondary | ICD-10-CM | POA: Insufficient documentation

## 2013-10-22 DIAGNOSIS — IMO0002 Reserved for concepts with insufficient information to code with codable children: Secondary | ICD-10-CM | POA: Insufficient documentation

## 2013-10-22 DIAGNOSIS — O09819 Supervision of pregnancy resulting from assisted reproductive technology, unspecified trimester: Secondary | ICD-10-CM | POA: Insufficient documentation

## 2013-10-22 DIAGNOSIS — E669 Obesity, unspecified: Secondary | ICD-10-CM | POA: Insufficient documentation

## 2013-10-22 DIAGNOSIS — O09529 Supervision of elderly multigravida, unspecified trimester: Secondary | ICD-10-CM | POA: Insufficient documentation

## 2013-10-22 DIAGNOSIS — O365990 Maternal care for other known or suspected poor fetal growth, unspecified trimester, not applicable or unspecified: Secondary | ICD-10-CM | POA: Insufficient documentation

## 2013-10-22 DIAGNOSIS — O3500X0 Maternal care for (suspected) central nervous system malformation or damage in fetus, unspecified, not applicable or unspecified: Secondary | ICD-10-CM | POA: Insufficient documentation

## 2013-10-22 NOTE — Progress Notes (Signed)
Carrie [name] was seen for ultrasound appointment today.  Please see [facility] report for details.

## 2013-10-26 ENCOUNTER — Ambulatory Visit (HOSPITAL_COMMUNITY)
Admission: RE | Admit: 2013-10-26 | Discharge: 2013-10-26 | Disposition: A | Discharge status: Home / Self Care | Payer: BC Managed Care – PPO | Source: Ambulatory Visit | Attending: Maternal and Fetal Medicine | Admitting: Maternal and Fetal Medicine

## 2013-10-26 DIAGNOSIS — O09529 Supervision of elderly multigravida, unspecified trimester: Secondary | ICD-10-CM | POA: Insufficient documentation

## 2013-10-26 DIAGNOSIS — O365990 Maternal care for other known or suspected poor fetal growth, unspecified trimester, not applicable or unspecified: Secondary | ICD-10-CM | POA: Insufficient documentation

## 2013-10-26 DIAGNOSIS — O09819 Supervision of pregnancy resulting from assisted reproductive technology, unspecified trimester: Secondary | ICD-10-CM | POA: Insufficient documentation

## 2013-10-26 DIAGNOSIS — E669 Obesity, unspecified: Secondary | ICD-10-CM | POA: Insufficient documentation

## 2013-10-26 DIAGNOSIS — O3500X0 Maternal care for (suspected) central nervous system malformation or damage in fetus, unspecified, not applicable or unspecified: Secondary | ICD-10-CM | POA: Insufficient documentation

## 2013-10-26 DIAGNOSIS — O350XX0 Maternal care for (suspected) central nervous system malformation in fetus, not applicable or unspecified: Secondary | ICD-10-CM | POA: Insufficient documentation

## 2013-10-26 DIAGNOSIS — IMO0002 Reserved for concepts with insufficient information to code with codable children: Secondary | ICD-10-CM | POA: Insufficient documentation

## 2013-10-26 NOTE — Progress Notes (Signed)
Carrie [name]  was seen today for an ultrasound appointment.  See full report in [facility].  Impression: Single IUP at 37 2/7 weeks Follow up due to lagging growth, gestational hypertension Interval growth is appropriate (22nd %tile).  [facility] measures at the 29th %tile. Normal amniotic fluid volume.  BP: 149/97  Recommendations: Findings discussed with Dr. Marcelle Overlie.  He will see her in clinic this afternoon for further evaluation. If the patient does indeed meet criteria for gestational hypertension, would recommend induction of labor as soon as feasible (ACOG Task Force guidelines make little distinction between gestational hypertension and mild preeclampsia)  Alpha Gula, MD

## 2013-10-27 ENCOUNTER — Encounter (HOSPITAL_COMMUNITY): Payer: Self-pay | Admitting: Pharmacist

## 2013-10-27 ENCOUNTER — Other Ambulatory Visit: Payer: Self-pay | Admitting: Obstetrics and Gynecology

## 2013-10-27 NOTE — H&P (Signed)
Carrie [name]  DICTATION # [id number] CSN# [id number]   Meriel Pica, MD 10/27/2013 9:23 AM

## 2013-10-28 ENCOUNTER — Encounter (HOSPITAL_COMMUNITY)
Admission: RE | Admit: 2013-10-28 | Discharge: 2013-10-28 | Disposition: A | Discharge status: Home / Self Care | Payer: BC Managed Care – PPO | Source: Ambulatory Visit | Attending: Obstetrics and Gynecology | Admitting: Obstetrics and Gynecology

## 2013-10-28 ENCOUNTER — Encounter (HOSPITAL_COMMUNITY): Payer: Self-pay

## 2013-10-28 ENCOUNTER — Other Ambulatory Visit (HOSPITAL_COMMUNITY): Payer: Self-pay | Admitting: Obstetrics and Gynecology

## 2013-10-28 DIAGNOSIS — Z01812 Encounter for preprocedural laboratory examination: Secondary | ICD-10-CM | POA: Insufficient documentation

## 2013-10-28 DIAGNOSIS — O09529 Supervision of elderly multigravida, unspecified trimester: Secondary | ICD-10-CM

## 2013-10-28 DIAGNOSIS — Z01818 Encounter for other preprocedural examination: Secondary | ICD-10-CM | POA: Insufficient documentation

## 2013-10-28 HISTORY — DX: Nausea with vomiting, unspecified: R11.2

## 2013-10-28 HISTORY — DX: Other specified postprocedural states: Z98.890

## 2013-10-28 LAB — COMPREHENSIVE METABOLIC PANEL
AST: 22 U/L (ref 0–37)
Albumin: 2.4 g/dL — ABNORMAL LOW (ref 3.5–5.2)
Calcium: 9.3 mg/dL (ref 8.4–10.5)
Creatinine, Ser: 0.56 mg/dL (ref 0.50–1.10)
GFR calc non Af Amer: >90 mL/min (ref >90)
Total Protein: 5.8 g/dL — ABNORMAL LOW (ref 6.0–8.3)

## 2013-10-28 LAB — TYPE AND SCREEN
ABO/RH(D): A POS
Antibody Screen: NEGATIVE

## 2013-10-28 LAB — CBC
MCH: 29.2 pg (ref 26.0–34.0)
MCHC: 33.7 g/dL (ref 30.0–36.0)
MCV: 86.6 fL (ref 78.0–100.0)
Platelets: 285 10*3/uL (ref 150–400)
RDW: 14.4 % (ref 11.5–15.5)

## 2013-10-28 LAB — ABO/RH: ABO/RH(D): A POS

## 2013-10-28 NOTE — Patient Instructions (Signed)
20 Carrie [name]  10/28/2013   Your procedure is scheduled on:  10/30/13  Enter through the Main Entrance of [facility] at 730 AM.  Pick up the phone at the desk and dial [phone].   Call this number if you have problems the morning of surgery: [phone]   Remember:   Do not eat food:After Midnight.  Do not drink clear liquids: After Midnight.  Take these medicines the morning of surgery with A SIP OF WATER: Previcid   Do not wear jewelry, make-up or nail polish.  Do not wear lotions, powders, or perfumes. You may wear deodorant.  Do not shave 48 hours prior to surgery.  Do not bring valuables to the hospital.  [facility] is not   responsible for any belongings or valuables brought to the hospital.  Contacts, dentures or bridgework may not be worn into surgery.  Leave suitcase in the car. After surgery it may be brought to your room.  For patients admitted to the hospital, checkout time is 11:00 AM the day of              discharge.   Patients discharged the day of surgery will not be allowed to drive             home.  Name and phone number of your driver: NA  Special Instructions:   Shower using CHG 2 nights before surgery and the night before surgery.  If you shower the day of surgery use CHG.  Use special wash - you have one bottle of CHG for all showers.  You should use approximately 1/3 of the bottle for each shower.   Please read over the following fact sheets that you were given:   Surgical Site Infection Prevention

## 2013-10-29 ENCOUNTER — Ambulatory Visit (HOSPITAL_COMMUNITY): Payer: BC Managed Care – PPO

## 2013-10-29 MED ORDER — DEXTROSE 5 % IV SOLN
2.0000 g | INTRAVENOUS | Status: DC
  Filled 2013-10-29: qty 2

## 2013-10-29 NOTE — H&P (Signed)
[name], [name] NO.:  [id number]  MEDICAL RECORD NO.:  [id number]  LOCATION:                               FACILITY:  [facility]  PHYSICIAN:  Duke Salvia. Marcelle [name], ScottD.DATE OF BIRTH:  11-17-1975  DATE OF ADMISSION:  10/28/2013 DATE OF DISCHARGE:  10/28/2013                             HISTORY & PHYSICAL   CHIEF COMPLAINT:  Symptomatic preeclampsia at term, declines trial of labor, the patient preference for primary cesarean section, intrauterine growth restriction.  HISTORY OF PRESENT ILLNESS:  A 37 year old, G2, P0-0-1-0, IVF pregnancy with a disappearing twin, EDD is November 14, 2013, EGA is 38 weeks.  This patient, early in pregnancy, had a normal maternity 21 test with booking BP 110/70.  On her AFP only, she did have elevated AFP and had followup serial ultrasound by MFM.  There was a discrepancy in maternity 21 test, such that followup Panorama test was done that showed a 46 XX, negative screen for trisomy 34, 2, 45 and Turner syndrome.  One-hour GTT was 141, her 3-hour GTT was done that was normal.  She was evaluated at 30 weeks with a positive FFN.  She did receive outpatient betamethasone series when AFI was normal, but there was lagging growth by abdominal circumference.  Ultrasound early November showed asymmetrical IUGR with AC at the third percentile, normal AFI, BPP was normal.  She was being followed with twice weekly nonstress test, also being followed for serial growth ultrasound by MFM.  Late November or early December, she began to experience significant lower extremity edema.  PIH labs remained normal.  On October 26, 2013, followup MFM ultrasound showed improvement in the growth measurements, but she continued to experience headache, some visual disturbance, and 4+ lower extremity edema with elevations of her blood pressure, consistent with mild PIH.  Discussion with Dr. Harlon Flor recommended delivery after 37 weeks for mild preeclampsia  given her other symptoms. This patient was presented information on induction and stated she had a preference for delivery by cesarean section, understanding risks from both.  She now presents for primary cesarean section.  This procedure including specific risks related to bleeding, infection, transfusion, adjacent organ injury, wound infection, phlebitis along with her expected recovery time reviewed which she understands and accepts.  PAST MEDICAL HISTORY:  Please see the [facility] form for details.  PHYSICAL EXAMINATION:  VITAL SIGNS:  Temp 98.6, blood pressure 140/88. HEENT:  Unremarkable. NECK:  Supple without masses. LUNGS:  Clear. CARDIOVASCULAR:  Regular rate and rhythm without murmurs, rubs, or gallops. BREASTS:  Not examined. ABDOMEN:  Term fundal height.  Fetal heart rate 140, cervix was a fingertip, 50%, vertex, membranes intact. EXTREMITIES:  4+ pitting lower extremity edema, reflexes were 2+.  No clonus.  IMPRESSION: 1. A 38-week in vitro fertilization pregnancy. 2. Intrauterine growth restriction followed during this pregnancy by     MFM. 3. Mild preeclampsia. 4. The patient declines trial of labor.  Prefers primary cesarean     section for delivery.  PLAN:  Primary cesarean section, she understands she will need to be on magnesium sulfate for 24 hours after delivery.  Other risks discussed as above.  Carrie [name], ScottD.   ______________________________ Duke Salvia. Marcelle [name], ScottD.    Carrie  D:  10/27/2013  T:  10/28/2013  Job:  [name]

## 2013-10-30 ENCOUNTER — Encounter (HOSPITAL_COMMUNITY)
Admission: RE | Disposition: A | Discharge status: Home / Self Care | Payer: Self-pay | Source: Ambulatory Visit | Attending: Obstetrics and Gynecology

## 2013-10-30 ENCOUNTER — Encounter (HOSPITAL_COMMUNITY): Payer: BC Managed Care – PPO | Admitting: Anesthesiology

## 2013-10-30 ENCOUNTER — Inpatient Hospital Stay (HOSPITAL_COMMUNITY): Payer: BC Managed Care – PPO | Admitting: Anesthesiology

## 2013-10-30 ENCOUNTER — Encounter (HOSPITAL_COMMUNITY): Payer: Self-pay | Admitting: *Deleted

## 2013-10-30 ENCOUNTER — Inpatient Hospital Stay (HOSPITAL_COMMUNITY)
Admission: RE | Admit: 2013-10-30 | Discharge: 2013-11-02 | DRG: 765 | Disposition: A | Discharge status: Home / Self Care | Payer: BC Managed Care – PPO | Source: Ambulatory Visit | Attending: Obstetrics and Gynecology | Admitting: Obstetrics and Gynecology

## 2013-10-30 DIAGNOSIS — O365990 Maternal care for other known or suspected poor fetal growth, unspecified trimester, not applicable or unspecified: Secondary | ICD-10-CM | POA: Diagnosis present

## 2013-10-30 DIAGNOSIS — Z3490 Encounter for supervision of normal pregnancy, unspecified, unspecified trimester: Secondary | ICD-10-CM

## 2013-10-30 DIAGNOSIS — IMO0002 Reserved for concepts with insufficient information to code with codable children: Principal | ICD-10-CM | POA: Diagnosis present

## 2013-10-30 LAB — MRSA PCR SCREENING: MRSA by PCR: NEGATIVE

## 2013-10-30 SURGERY — Surgical Case
Anesthesia: Spinal | Site: Abdomen

## 2013-10-30 MED ORDER — ONDANSETRON HCL 4 MG/2ML IJ SOLN: 4.0000 mg | INTRAMUSCULAR | Status: DC | PRN

## 2013-10-30 MED ORDER — OXYTOCIN 40 UNITS IN LACTATED RINGERS INFUSION - SIMPLE MED: 62.5000 mL/h | INTRAVENOUS | Status: DC

## 2013-10-30 MED ORDER — PHENYLEPHRINE 8 MG IN D5W 100 ML (0.08MG/ML) PREMIX OPTIME
INJECTION | INTRAVENOUS | Status: DC | PRN
  Administered 2013-10-30: 60 ug/min via INTRAVENOUS

## 2013-10-30 MED ORDER — MORPHINE SULFATE 0.5 MG/ML IJ SOLN
INTRAMUSCULAR | Status: AC
  Filled 2013-10-30: qty 10

## 2013-10-30 MED ORDER — DEXTROSE 5 % IV SOLN
2.0000 g | INTRAVENOUS | Status: DC | PRN
  Administered 2013-10-30: 2 g via INTRAVENOUS

## 2013-10-30 MED ORDER — SODIUM CHLORIDE 0.9 % IJ SOLN: 3.0000 mL | Freq: Two times a day (BID) | INTRAMUSCULAR | Status: DC

## 2013-10-30 MED ORDER — DIPHENHYDRAMINE HCL 25 MG PO CAPS: 25.0000 mg | ORAL_CAPSULE | Freq: Four times a day (QID) | ORAL | Status: DC | PRN

## 2013-10-30 MED ORDER — LACTATED RINGERS IV SOLN: INTRAVENOUS | Status: DC

## 2013-10-30 MED ORDER — PROMETHAZINE HCL 25 MG/ML IJ SOLN
INTRAMUSCULAR | Status: AC
  Administered 2013-10-30: 12.5 mg via INTRAVENOUS
  Filled 2013-10-30: qty 1

## 2013-10-30 MED ORDER — KETOROLAC TROMETHAMINE 60 MG/2ML IM SOLN
INTRAMUSCULAR | Status: AC
  Administered 2013-10-30: 60 mg via INTRAMUSCULAR
  Filled 2013-10-30: qty 2

## 2013-10-30 MED ORDER — SCOPOLAMINE 1 MG/3DAYS TD PT72
1.0000 | MEDICATED_PATCH | Freq: Once | TRANSDERMAL | Status: DC
  Administered 2013-10-30: 1.5 mg via TRANSDERMAL

## 2013-10-30 MED ORDER — DIPHENHYDRAMINE HCL 50 MG/ML IJ SOLN: 12.5000 mg | INTRAMUSCULAR | Status: DC | PRN

## 2013-10-30 MED ORDER — OXYTOCIN 10 UNIT/ML IJ SOLN
40.0000 [IU] | INTRAVENOUS | Status: DC | PRN
  Administered 2013-10-30: 40 [IU] via INTRAVENOUS

## 2013-10-30 MED ORDER — ONDANSETRON HCL 4 MG/2ML IJ SOLN
INTRAMUSCULAR | Status: DC | PRN
  Administered 2013-10-30: 4 mg via INTRAVENOUS

## 2013-10-30 MED ORDER — SODIUM CHLORIDE 0.9 % IJ SOLN: 3.0000 mL | INTRAMUSCULAR | Status: DC | PRN

## 2013-10-30 MED ORDER — DEXTROSE 5 % IV SOLN
1.0000 ug/kg/h | INTRAVENOUS | Status: DC | PRN
  Filled 2013-10-30: qty 2

## 2013-10-30 MED ORDER — FLEET ENEMA 7-19 GM/118ML RE ENEM: 1.0000 | ENEMA | Freq: Every day | RECTAL | Status: DC | PRN

## 2013-10-30 MED ORDER — DEXAMETHASONE SODIUM PHOSPHATE 4 MG/ML IJ SOLN
8.0000 mg | Freq: Once | INTRAMUSCULAR | Status: AC | PRN
  Administered 2013-10-30: 8 mg via INTRAVENOUS

## 2013-10-30 MED ORDER — DIBUCAINE 1 % RE OINT: 1.0000 "application " | TOPICAL_OINTMENT | RECTAL | Status: DC | PRN

## 2013-10-30 MED ORDER — ONDANSETRON HCL 4 MG PO TABS: 4.0000 mg | ORAL_TABLET | ORAL | Status: DC | PRN

## 2013-10-30 MED ORDER — OXYCODONE-ACETAMINOPHEN 5-325 MG PO TABS
1.0000 | ORAL_TABLET | Freq: Four times a day (QID) | ORAL | Status: DC | PRN
Start: 2013-10-30 — End: 2013-10-31
  Administered 2013-10-31: 1 via ORAL
  Filled 2013-10-30: qty 1

## 2013-10-30 MED ORDER — IBUPROFEN 800 MG PO TABS
800.0000 mg | ORAL_TABLET | Freq: Three times a day (TID) | ORAL | Status: DC | PRN
  Administered 2013-10-31: 800 mg via ORAL
  Filled 2013-10-30: qty 1

## 2013-10-30 MED ORDER — LACTATED RINGERS IV SOLN
INTRAVENOUS | Status: DC
  Administered 2013-10-30 (×2): via INTRAVENOUS

## 2013-10-30 MED ORDER — SODIUM CHLORIDE 0.9 % IV SOLN: 250.0000 mL | INTRAVENOUS | Status: DC

## 2013-10-30 MED ORDER — PHENYLEPHRINE HCL 10 MG/ML IJ SOLN
INTRAMUSCULAR | Status: DC | PRN
  Administered 2013-10-30 (×2): 40 ug via INTRAVENOUS

## 2013-10-30 MED ORDER — SIMETHICONE 80 MG PO CHEW: 80.0000 mg | CHEWABLE_TABLET | ORAL | Status: DC | PRN

## 2013-10-30 MED ORDER — BISACODYL 10 MG RE SUPP: 10.0000 mg | Freq: Every day | RECTAL | Status: DC | PRN

## 2013-10-30 MED ORDER — NALBUPHINE HCL 10 MG/ML IJ SOLN
5.0000 mg | INTRAMUSCULAR | Status: DC | PRN
  Filled 2013-10-30: qty 1

## 2013-10-30 MED ORDER — MIDAZOLAM HCL 2 MG/2ML IJ SOLN
INTRAMUSCULAR | Status: AC
  Filled 2013-10-30: qty 2

## 2013-10-30 MED ORDER — ONDANSETRON HCL 4 MG/2ML IJ SOLN
INTRAMUSCULAR | Status: AC
  Filled 2013-10-30: qty 2

## 2013-10-30 MED ORDER — MAGNESIUM SULFATE 40 G IN LACTATED RINGERS - SIMPLE
2.0000 g/h | INTRAVENOUS | Status: DC
  Administered 2013-10-30: 2 g/h via INTRAVENOUS
  Filled 2013-10-30: qty 500

## 2013-10-30 MED ORDER — FENTANYL CITRATE 0.05 MG/ML IJ SOLN: 25.0000 ug | INTRAMUSCULAR | Status: DC | PRN

## 2013-10-30 MED ORDER — WITCH HAZEL-GLYCERIN EX PADS: 1.0000 "application " | MEDICATED_PAD | CUTANEOUS | Status: DC | PRN

## 2013-10-30 MED ORDER — DIPHENHYDRAMINE HCL 25 MG PO CAPS: 25.0000 mg | ORAL_CAPSULE | ORAL | Status: DC | PRN

## 2013-10-30 MED ORDER — PRENATAL MULTIVITAMIN CH
1.0000 | ORAL_TABLET | Freq: Every day | ORAL | Status: DC
  Administered 2013-10-30: 1 via ORAL
  Filled 2013-10-30: qty 1

## 2013-10-30 MED ORDER — DEXAMETHASONE SODIUM PHOSPHATE 4 MG/ML IJ SOLN
INTRAMUSCULAR | Status: AC
  Filled 2013-10-30: qty 2

## 2013-10-30 MED ORDER — KETOROLAC TROMETHAMINE 60 MG/2ML IM SOLN
60.0000 mg | Freq: Once | INTRAMUSCULAR | Status: AC | PRN
  Administered 2013-10-30: 60 mg via INTRAMUSCULAR

## 2013-10-30 MED ORDER — DIPHENHYDRAMINE HCL 50 MG/ML IJ SOLN: 25.0000 mg | INTRAMUSCULAR | Status: DC | PRN

## 2013-10-30 MED ORDER — METOCLOPRAMIDE HCL 5 MG/ML IJ SOLN
10.0000 mg | Freq: Three times a day (TID) | INTRAMUSCULAR | Status: DC | PRN
  Administered 2013-10-30: 10 mg via INTRAVENOUS

## 2013-10-30 MED ORDER — SCOPOLAMINE 1 MG/3DAYS TD PT72
1.0000 | MEDICATED_PATCH | Freq: Once | TRANSDERMAL | Status: DC
  Filled 2013-10-30: qty 1

## 2013-10-30 MED ORDER — MAGNESIUM SULFATE BOLUS VIA INFUSION
4.0000 g | Freq: Once | INTRAVENOUS | Status: AC
  Administered 2013-10-30: 4 g via INTRAVENOUS
  Filled 2013-10-30: qty 500

## 2013-10-30 MED ORDER — SIMETHICONE 80 MG PO CHEW: 80.0000 mg | CHEWABLE_TABLET | ORAL | Status: DC

## 2013-10-30 MED ORDER — LACTATED RINGERS IV SOLN: Freq: Once | INTRAVENOUS | Status: DC

## 2013-10-30 MED ORDER — KETOROLAC TROMETHAMINE 30 MG/ML IJ SOLN: 30.0000 mg | Freq: Four times a day (QID) | INTRAMUSCULAR | Status: DC | PRN

## 2013-10-30 MED ORDER — ZOLPIDEM TARTRATE 5 MG PO TABS: 5.0000 mg | ORAL_TABLET | Freq: Every evening | ORAL | Status: DC | PRN

## 2013-10-30 MED ORDER — METOCLOPRAMIDE HCL 5 MG/ML IJ SOLN
INTRAMUSCULAR | Status: AC
  Administered 2013-10-30: 10 mg via INTRAVENOUS
  Filled 2013-10-30: qty 2

## 2013-10-30 MED ORDER — FENTANYL CITRATE 0.05 MG/ML IJ SOLN
INTRAMUSCULAR | Status: DC | PRN
  Administered 2013-10-30: 50 ug via INTRAVENOUS
  Administered 2013-10-30: 12.5 ug via INTRATHECAL

## 2013-10-30 MED ORDER — SCOPOLAMINE 1 MG/3DAYS TD PT72
MEDICATED_PATCH | TRANSDERMAL | Status: AC
  Filled 2013-10-30: qty 1

## 2013-10-30 MED ORDER — OXYTOCIN 10 UNIT/ML IJ SOLN
INTRAMUSCULAR | Status: AC
  Filled 2013-10-30: qty 4

## 2013-10-30 MED ORDER — BUPIVACAINE IN DEXTROSE 0.75-8.25 % IT SOLN
INTRATHECAL | Status: DC | PRN
  Administered 2013-10-30: 1.5 mL via INTRATHECAL

## 2013-10-30 MED ORDER — FENTANYL CITRATE 0.05 MG/ML IJ SOLN
INTRAMUSCULAR | Status: AC
  Filled 2013-10-30: qty 2

## 2013-10-30 MED ORDER — NALOXONE HCL 0.4 MG/ML IJ SOLN: 0.4000 mg | INTRAMUSCULAR | Status: DC | PRN

## 2013-10-30 MED ORDER — MORPHINE SULFATE (PF) 0.5 MG/ML IJ SOLN
INTRAMUSCULAR | Status: DC | PRN
  Administered 2013-10-30: .1 mg via INTRATHECAL

## 2013-10-30 MED ORDER — PROMETHAZINE HCL 25 MG/ML IJ SOLN
6.2500 mg | INTRAMUSCULAR | Status: DC | PRN
  Administered 2013-10-30: 12.5 mg via INTRAVENOUS

## 2013-10-30 MED ORDER — LANOLIN HYDROUS EX OINT: 1.0000 "application " | TOPICAL_OINTMENT | CUTANEOUS | Status: DC | PRN

## 2013-10-30 MED ORDER — TETANUS-DIPHTH-ACELL PERTUSSIS 5-2.5-18.5 LF-MCG/0.5 IM SUSP
0.5000 mL | Freq: Once | INTRAMUSCULAR | Status: DC
  Filled 2013-10-30: qty 0.5

## 2013-10-30 MED ORDER — DEXTROSE IN LACTATED RINGERS 5 % IV SOLN
INTRAVENOUS | Status: DC
  Administered 2013-10-30: 23:00:00 via INTRAVENOUS

## 2013-10-30 MED ORDER — SIMETHICONE 80 MG PO CHEW
80.0000 mg | CHEWABLE_TABLET | Freq: Three times a day (TID) | ORAL | Status: DC
  Administered 2013-10-30 – 2013-10-31 (×2): 80 mg via ORAL
  Filled 2013-10-30 (×2): qty 1

## 2013-10-30 MED ORDER — ONDANSETRON HCL 4 MG/2ML IJ SOLN
4.0000 mg | Freq: Three times a day (TID) | INTRAMUSCULAR | Status: DC | PRN
  Administered 2013-10-30: 4 mg via INTRAVENOUS
  Filled 2013-10-30: qty 2

## 2013-10-30 MED ORDER — MEASLES, MUMPS & RUBELLA VAC ~~LOC~~ INJ: 0.5000 mL | INJECTION | Freq: Once | SUBCUTANEOUS | Status: DC

## 2013-10-30 MED ORDER — MEPERIDINE HCL 25 MG/ML IJ SOLN: 6.2500 mg | INTRAMUSCULAR | Status: DC | PRN

## 2013-10-30 MED ORDER — MENTHOL 3 MG MT LOZG: 1.0000 | LOZENGE | OROMUCOSAL | Status: DC | PRN

## 2013-10-30 MED ORDER — SENNOSIDES-DOCUSATE SODIUM 8.6-50 MG PO TABS
2.0000 | ORAL_TABLET | ORAL | Status: DC
  Administered 2013-10-30: 2 via ORAL
  Filled 2013-10-30: qty 2

## 2013-10-30 MED ORDER — PHENYLEPHRINE 8 MG IN D5W 100 ML (0.08MG/ML) PREMIX OPTIME
INJECTION | INTRAVENOUS | Status: AC
  Filled 2013-10-30: qty 100

## 2013-10-30 SURGICAL SUPPLY — 27 items
BENZOIN TINCTURE PRP APPL 2/3 (GAUZE/BANDAGES/DRESSINGS) ×2 IMPLANT
CLAMP CORD UMBIL (MISCELLANEOUS) IMPLANT
CLOTH BEACON ORANGE TIMEOUT ST (SAFETY) ×2 IMPLANT
DRAPE LG THREE QUARTER DISP (DRAPES) IMPLANT
DRSG OPSITE POSTOP 4X10 (GAUZE/BANDAGES/DRESSINGS) ×2 IMPLANT
DURAPREP 26ML APPLICATOR (WOUND CARE) ×2 IMPLANT
ELECT REM PT RETURN 9FT ADLT (ELECTROSURGICAL) ×2
ELECTRODE REM PT RTRN 9FT ADLT (ELECTROSURGICAL) ×1 IMPLANT
EXTRACTOR VACUUM M CUP 4 TUBE (SUCTIONS) IMPLANT
GLOVE BIO SURGEON STRL SZ7 (GLOVE) ×2 IMPLANT
GOWN PREVENTION PLUS XLARGE (GOWN DISPOSABLE) ×4 IMPLANT
GOWN STRL REIN XL XLG (GOWN DISPOSABLE) ×4 IMPLANT
KIT ABG SYR 3ML LUER SLIP (SYRINGE) IMPLANT
NEEDLE HYPO 25X5/8 SAFETYGLIDE (NEEDLE) ×2 IMPLANT
NS IRRIG 1000ML POUR BTL (IV SOLUTION) ×2 IMPLANT
PACK C SECTION WH (CUSTOM PROCEDURE TRAY) ×2 IMPLANT
PAD OB MATERNITY 4.3X12.25 (PERSONAL CARE ITEMS) ×2 IMPLANT
STRIP CLOSURE SKIN 1/2X4 (GAUZE/BANDAGES/DRESSINGS) ×2 IMPLANT
STRIP CLOSURE SKIN 1/4X4 (GAUZE/BANDAGES/DRESSINGS) ×2 IMPLANT
SUT CHROMIC 0 CTX 36 (SUTURE) ×6 IMPLANT
SUT MON AB 4-0 PS1 27 (SUTURE) ×2 IMPLANT
SUT PDS AB 0 CT1 27 (SUTURE) ×4 IMPLANT
SUT VIC AB 3-0 CT1 27 (SUTURE) ×2
SUT VIC AB 3-0 CT1 TAPERPNT 27 (SUTURE) ×2 IMPLANT
TOWEL OR 17X24 6PK STRL BLUE (TOWEL DISPOSABLE) ×2 IMPLANT
TRAY FOLEY CATH 14FR (SET/KITS/TRAYS/PACK) ×2 IMPLANT
WATER STERILE IRR 1000ML POUR (IV SOLUTION) ×2 IMPLANT

## 2013-10-30 NOTE — Progress Notes (Signed)
The patient was re-examined with no change in status 

## 2013-10-30 NOTE — Anesthesia Preprocedure Evaluation (Addendum)
Anesthesia Evaluation  Patient identified by MRN, date of birth, ID band Patient awake    Reviewed: Allergy & Precautions, H&P , NPO status , Patient's Chart, lab work & pertinent test results  History of Anesthesia Complications (+) PONV and history of anesthetic complications  Airway Mallampati: II TM Distance: >3 FB Neck ROM: Full    Dental no notable dental hx.    Pulmonary asthma ,  breath sounds clear to auscultation  Pulmonary exam normal       Cardiovascular negative cardio ROS  Rhythm:Regular Rate:Normal     Neuro/Psych negative neurological ROS  negative psych ROS   GI/Hepatic negative GI ROS, Neg liver ROS,   Endo/Other  Morbid obesity  Renal/GU negative Renal ROS  negative genitourinary   Musculoskeletal negative musculoskeletal ROS (+)   Abdominal   Peds negative pediatric ROS (+)  Hematology negative hematology ROS (+)   Anesthesia Other Findings   Reproductive/Obstetrics (+) Pregnancy Preeclampsia                           Anesthesia Physical Anesthesia Plan  ASA: III  Anesthesia Plan: Spinal   Post-op Pain Management:    Induction:   Airway Management Planned: Natural Airway  Additional Equipment:   Intra-op Plan:   Post-operative Plan:   Informed Consent: I have reviewed the patients History and Physical, chart, labs and discussed the procedure including the risks, benefits and alternatives for the proposed anesthesia with the patient or authorized representative who has indicated his/her understanding and acceptance.     Plan Discussed with:   Anesthesia Plan Comments:         Anesthesia Quick Evaluation

## 2013-10-30 NOTE — Anesthesia Postprocedure Evaluation (Signed)
  Anesthesia Post-op Note  Patient: Carrie [name]  Procedure(s) Performed: Procedure(s) (LRB): CESAREAN SECTION (N/A)  Patient Location: PACU  Anesthesia Type: Spinal  Level of Consciousness: awake and alert   Airway and Oxygen Therapy: Patient Spontanous Breathing  Post-op Pain: mild  Post-op Assessment: Post-op Vital signs reviewed, Patient's Cardiovascular Status Stable, Respiratory Function Stable, Patent Airway and No signs of Nausea or vomiting  Last Vitals:  Filed Vitals:   10/30/13 1011  BP: 143/84  Pulse: 101  Temp: 36.8 C  Resp: 20    Post-op Vital Signs: stable   Complications: No apparent anesthesia complications

## 2013-10-30 NOTE — Transfer of Care (Signed)
Immediate Anesthesia Transfer of Care Note  Patient: Carrie [name]  Procedure(s) Performed: Procedure(s) with comments: CESAREAN SECTION (N/A) - PRIMARY  Patient Location: PACU  Anesthesia Type:Spinal  Level of Consciousness: awake and alert   Airway & Oxygen Therapy: Patient Spontanous Breathing and Patient connected to nasal cannula oxygen  Post-op Assessment: Report given to PACU RN and Post -op Vital signs reviewed and stable  Post vital signs: Reviewed and stable  Complications: No apparent anesthesia complications

## 2013-10-30 NOTE — Op Note (Signed)
Preoperative diagnosis:[redacted]w[redacted]d  IUP, preeclampsia, IVF pregnancy, history of IUGR, patient preference for delivered by cesarean section  Postoperative diagnosis: Same  Procedure: Primary low transverse cesarean section  Surgeon: Marcelle Overlie  Anesthesia: Spinal  EBL: 700 cc  Complications: None  Drains: Foley catheter  Procedure and findings:  The patient was taken the operating room after an adequate level of spinal anesthetic was obtained with the patient in left tilt position the abdomen prepped and draped in the usual fashion, Foley catheter positioned. Appropriate timeout taken at that point. After prepping and draping, transverse incision made 2 finger breaths above the symphysis carried down to the fascia which was incised and extended transversely. Rectus muscles divided in the midline, peritoneum entered superiorly without incident and extended in a vertical fashion. The vesicouterine serosa was incised and bluntly and sharply dissected below, bladder blade repositioned at that point. Transverse incision made in the lower segment extended with bandage scissors clear fluid noted the patient then delivered of a healthy infant Apgars 9 and 9 the in the was suctioned cord clamped and passed the pediatric team for further care. Placenta was removed manually intact, sent to pathology  Uterus exteriorized cavity wiped clean with laparotomy pack closure transversely of 0 chromic in a locked fashion followed by an imbricating layer of chromic. This is hemostatic the bladder flap area was intact and hemostatic. Bilateral tubes and ovaries were normal. Prior to closure sponge needle instrument counts reported as correct x2. Peritoneum was enclose with 3-0 Vicryl suture 3-0 Vicryl interrupted sutures used to reapproximate the rectus muscles in the midline. A doubly looped 0 PDS suture was then used to close the fascia transversely. Subcutaneous tissue was irrigated noted be hemostatic subcutaneous dead  space was closed with 3-0 Vicryl running suture. 4-0 Monocryl subcuticular closure with honeycomb dressing she tolerated this well went to recovery room in good condition.  Dictated with [facility]  Dann Ventress M. Milana Obey.D.

## 2013-10-30 NOTE — Anesthesia Procedure Notes (Signed)
Spinal  Patient location during procedure: OR Staffing Anesthesiologist: Germani Gavilanes Performed by: anesthesiologist  Preanesthetic Checklist Completed: patient identified, site marked, surgical consent, pre-op evaluation, timeout performed, IV checked, risks and benefits discussed and monitors and equipment checked Spinal Block Patient position: sitting Prep: ChloraPrep Patient monitoring: heart rate, continuous pulse ox and blood pressure Approach: right paramedian Location: L3-4 Injection technique: single-shot Needle Needle type: Pencan  Needle gauge: 24 G Needle length: 9 cm Additional Notes Expiration date of kit checked and confirmed. Patient tolerated procedure well, without complications.     

## 2013-10-31 LAB — CBC
HCT: 31.4 % — ABNORMAL LOW (ref 36.0–46.0)
Hemoglobin: 10.7 g/dL — ABNORMAL LOW (ref 12.0–15.0)
MCH: 29.3 pg (ref 26.0–34.0)
Platelets: 341 10*3/uL (ref 150–400)
RBC: 3.65 MIL/uL — ABNORMAL LOW (ref 3.87–5.11)
RDW: 14.4 % (ref 11.5–15.5)
WBC: 13.6 10*3/uL — ABNORMAL HIGH (ref 4.0–10.5)

## 2013-10-31 MED ORDER — TETANUS-DIPHTH-ACELL PERTUSSIS 5-2.5-18.5 LF-MCG/0.5 IM SUSP: 0.5000 mL | Freq: Once | INTRAMUSCULAR | Status: DC

## 2013-10-31 MED ORDER — MEASLES, MUMPS & RUBELLA VAC ~~LOC~~ INJ
0.5000 mL | INJECTION | Freq: Once | SUBCUTANEOUS | Status: DC
  Filled 2013-10-31: qty 0.5

## 2013-10-31 MED ORDER — ONDANSETRON HCL 4 MG PO TABS: 4.0000 mg | ORAL_TABLET | ORAL | Status: DC | PRN

## 2013-10-31 MED ORDER — SODIUM CHLORIDE 0.9 % IJ SOLN: 3.0000 mL | INTRAMUSCULAR | Status: DC | PRN

## 2013-10-31 MED ORDER — SODIUM CHLORIDE 0.9 % IV SOLN: 250.0000 mL | INTRAVENOUS | Status: DC

## 2013-10-31 MED ORDER — PSEUDOEPHEDRINE HCL 30 MG PO TABS
30.0000 mg | ORAL_TABLET | Freq: Four times a day (QID) | ORAL | Status: DC | PRN
  Administered 2013-10-31 (×2): 30 mg via ORAL
  Filled 2013-10-31 (×2): qty 1

## 2013-10-31 MED ORDER — SIMETHICONE 80 MG PO CHEW
80.0000 mg | CHEWABLE_TABLET | ORAL | Status: DC | PRN
  Administered 2013-11-02: 80 mg via ORAL

## 2013-10-31 MED ORDER — OXYCODONE-ACETAMINOPHEN 5-325 MG PO TABS
1.0000 | ORAL_TABLET | Freq: Four times a day (QID) | ORAL | Status: DC | PRN
Start: 2013-10-31 — End: 2013-11-02

## 2013-10-31 MED ORDER — WITCH HAZEL-GLYCERIN EX PADS: 1.0000 "application " | MEDICATED_PAD | CUTANEOUS | Status: DC | PRN

## 2013-10-31 MED ORDER — PRENATAL MULTIVITAMIN CH
1.0000 | ORAL_TABLET | Freq: Every day | ORAL | Status: DC
  Administered 2013-11-01: 1 via ORAL
  Filled 2013-10-31: qty 1

## 2013-10-31 MED ORDER — SODIUM CHLORIDE 0.9 % IJ SOLN: 3.0000 mL | Freq: Two times a day (BID) | INTRAMUSCULAR | Status: DC

## 2013-10-31 MED ORDER — DIPHENHYDRAMINE HCL 25 MG PO CAPS: 25.0000 mg | ORAL_CAPSULE | Freq: Four times a day (QID) | ORAL | Status: DC | PRN

## 2013-10-31 MED ORDER — SIMETHICONE 80 MG PO CHEW
80.0000 mg | CHEWABLE_TABLET | ORAL | Status: DC
  Administered 2013-11-01: 80 mg via ORAL
  Filled 2013-10-31 (×3): qty 1

## 2013-10-31 MED ORDER — MENTHOL 3 MG MT LOZG: 1.0000 | LOZENGE | OROMUCOSAL | Status: DC | PRN

## 2013-10-31 MED ORDER — IBUPROFEN 800 MG PO TABS
800.0000 mg | ORAL_TABLET | Freq: Three times a day (TID) | ORAL | Status: DC | PRN
  Administered 2013-10-31 – 2013-11-02 (×5): 800 mg via ORAL
  Filled 2013-10-31 (×5): qty 1

## 2013-10-31 MED ORDER — DIBUCAINE 1 % RE OINT: 1.0000 "application " | TOPICAL_OINTMENT | RECTAL | Status: DC | PRN

## 2013-10-31 MED ORDER — LANOLIN HYDROUS EX OINT: 1.0000 "application " | TOPICAL_OINTMENT | CUTANEOUS | Status: DC | PRN

## 2013-10-31 MED ORDER — OXYTOCIN 40 UNITS IN LACTATED RINGERS INFUSION - SIMPLE MED: 62.5000 mL/h | INTRAVENOUS | Status: AC

## 2013-10-31 MED ORDER — SENNOSIDES-DOCUSATE SODIUM 8.6-50 MG PO TABS
2.0000 | ORAL_TABLET | ORAL | Status: DC
  Administered 2013-11-01: 2 via ORAL
  Filled 2013-10-31 (×3): qty 2

## 2013-10-31 MED ORDER — SIMETHICONE 80 MG PO CHEW
80.0000 mg | CHEWABLE_TABLET | Freq: Three times a day (TID) | ORAL | Status: DC
  Administered 2013-10-31 – 2013-11-02 (×4): 80 mg via ORAL
  Filled 2013-10-31 (×3): qty 1

## 2013-10-31 MED ORDER — ONDANSETRON HCL 4 MG/2ML IJ SOLN: 4.0000 mg | INTRAMUSCULAR | Status: DC | PRN

## 2013-10-31 MED ORDER — BISACODYL 10 MG RE SUPP: 10.0000 mg | Freq: Every day | RECTAL | Status: DC | PRN

## 2013-10-31 MED ORDER — ZOLPIDEM TARTRATE 5 MG PO TABS: 5.0000 mg | ORAL_TABLET | Freq: Every evening | ORAL | Status: DC | PRN

## 2013-10-31 MED ORDER — FLEET ENEMA 7-19 GM/118ML RE ENEM: 1.0000 | ENEMA | Freq: Every day | RECTAL | Status: DC | PRN

## 2013-10-31 NOTE — Anesthesia Postprocedure Evaluation (Signed)
  Anesthesia Post-op Note  Patient: Carrie [name]  Procedure(s) Performed: Procedure(s) with comments: CESAREAN SECTION (N/A) - PRIMARY  Patient Location: A-ICU  Anesthesia Type:Spinal  Level of Consciousness: awake and alert   Airway and Oxygen Therapy: Patient Spontanous Breathing  Post-op Pain: mild  Post-op Assessment: Patient's Cardiovascular Status Stable, Respiratory Function Stable, No signs of Nausea or vomiting, Pain level controlled, No headache, No residual numbness and No residual motor weakness  Post-op Vital Signs: stable  Complications: No apparent anesthesia complications

## 2013-10-31 NOTE — Progress Notes (Signed)

## 2013-10-31 NOTE — Progress Notes (Signed)
Subjective: Postpartum Day 1: Cesarean Delivery Patient reports tolerating PO.    Objective: Vital signs in last 24 hours: Temp:  [95.8 F (35.4 C)-98.4 F (36.9 C)] 97.4 F (36.3 C) (12/21 0928) Pulse Rate:  [76-110] 102 (12/21 0928) Resp:  [13-22] 18 (12/21 0521) BP: (119-159)/(55-109) 151/94 mmHg (12/21 0928) SpO2:  [93 %-100 %] 99 % (12/21 0928)  Physical Exam:  General: alert Lochia: appropriate Uterine Fundus: firm Incision: healing well DVT Evaluation: No evidence of DVT seen on physical exam.   Recent Labs  10/31/13 0559  HGB 10.7*  HCT 31.4*    Assessment/Plan: Status post Cesarean section. Doing well postoperatively.  Continue current care, has had 24 hrs Mag, will transfer to floor.  Meriel Pica 10/31/2013, 10:13 AM

## 2013-10-31 NOTE — Lactation Note (Signed)
This note was copied from the chart of Girl Shaquela Weichert. Lactation Consultation Note  Patient Name: Girl Sofya Moustafa [id number] Date: 10/31/2013 Reason for consult: Follow-up assessment;Infant < 6lbs  Baby is now 32 hours of age and mom is stable and moved to regular pp unit.  Baby has breastfed exclusively since birth and for 11-32 minutes per feeding with copious voids and stools.  Mom states baby is latching well.  Parents are going to call their insurance to inquire about breast pump coverage.  LC encouraged continued cue feedings ad lib and reviewed other comfort measures if baby fussy between feedings.   Maternal Data    Feeding Feeding Type: Breast Fed Length of feed: 32 min  LATCH Score/Interventions Latch: Grasps breast easily, tongue down, lips flanged, rhythmical sucking.  Audible Swallowing: Spontaneous and intermittent Intervention(s): Skin to skin Intervention(s): Hand expression  Type of Nipple: Everted at rest and after stimulation  Comfort (Breast/Nipple): Soft / non-tender     Hold (Positioning): No assistance needed to correctly position infant at breast.  LATCH Score: 10  Lactation Tools Discussed/Used   Cue feedings comfort measures  Consult Status Consult Status: Follow-up Date: 11/01/13 Follow-up type: In-patient    Warrick Parisian [facility] 10/31/2013, 5:54 PM

## 2013-11-01 ENCOUNTER — Encounter (HOSPITAL_COMMUNITY): Payer: Self-pay | Admitting: Obstetrics and Gynecology

## 2013-11-01 NOTE — Lactation Note (Signed)
This note was copied from the chart of Carrie Jennaya Pogue. Lactation Consultation Note MBU RN requests comfort gels for patient. Mom reports pain with pumping, RN increased flange size to 27 with improved comfort.  RN reports good latch at this time.   Patient Name: Carrie [name] [id number] Date: 11/01/2013     Maternal Data    Feeding Feeding Type: Breast Fed Length of feed: 20 min  LATCH Score/Interventions                      Lactation Tools Discussed/Used     Consult Status      Shoptaw, Arvella Merles 11/01/2013, 6:41 PM

## 2013-11-01 NOTE — Progress Notes (Signed)
Subjective: Postpartum Day 2: Cesarean Delivery Patient reports tolerating PO, + flatus and no problems voiding.  Denies HA, blurred vision  Objective: Vital signs in last 24 hours: Temp:  [97.4 F (36.3 C)-98.9 F (37.2 C)] 98.8 F (37.1 C) (12/22 0611) Pulse Rate:  [98-107] 103 (12/22 0611) Resp:  [18] 18 (12/22 0611) BP: (127-151)/(75-94) 136/75 mmHg (12/22 0611) SpO2:  [96 %-99 %] 96 % (12/22 8657)  Physical Exam:  General: alert and cooperative Lochia: appropriate Uterine Fundus: firm Incision: honeycomb dressing CDI DVT Evaluation: No evidence of DVT seen on physical exam. Negative Homan's sign. No cords or calf tenderness. Calf/Ankle edema is present.   Recent Labs  10/31/13 0559  HGB 10.7*  HCT 31.4*    Assessment/Plan: Status post Cesarean section. Pregnancy complicated by preeclampsia and IUGR   Continue current care.  CURTIS,CAROL G 11/01/2013, 7:47 AM

## 2013-11-02 LAB — COMPREHENSIVE METABOLIC PANEL
BUN: 8 mg/dL (ref 6–23)
CO2: 22 mEq/L (ref 19–32)
Calcium: 8.9 mg/dL (ref 8.4–10.5)
Creatinine, Ser: 0.61 mg/dL (ref 0.50–1.10)
GFR calc Af Amer: >90 mL/min (ref >90)
GFR calc non Af Amer: >90 mL/min (ref >90)
Glucose, Bld: 105 mg/dL — ABNORMAL HIGH (ref 70–99)

## 2013-11-02 LAB — CBC
MCH: 29.9 pg (ref 26.0–34.0)
MCV: 87.9 fL (ref 78.0–100.0)
Platelets: 354 10*3/uL (ref 150–400)
RBC: 3.54 MIL/uL — ABNORMAL LOW (ref 3.87–5.11)
RDW: 14.6 % (ref 11.5–15.5)
WBC: 11.4 10*3/uL — ABNORMAL HIGH (ref 4.0–10.5)

## 2013-11-02 MED ORDER — IBUPROFEN 800 MG PO TABS: 800.0000 mg | ORAL_TABLET | Freq: Three times a day (TID) | ORAL | Status: DC | PRN

## 2013-11-02 NOTE — Discharge Summary (Signed)
Obstetric Discharge Summary Reason for Admission: cesarean section Prenatal Procedures: ultrasound Intrapartum Procedures: cesarean: low cervical, transverse Postpartum Procedures: none Complications-Operative and Postpartum: none Hemoglobin  Date Value Range Status  10/31/2013 10.7* 12.0 - 15.0 g/dL Final     HCT  Date Value Range Status  10/31/2013 31.4* 36.0 - 46.0 % Final    Physical Exam:  General: alert and cooperative Lochia: appropriate Uterine Fundus: firm Incision: honeycomb dressing CDI DVT Evaluation: No evidence of DVT seen on physical exam. Negative Homan's sign. No significant calf/ankle edema. Calf/Ankle edema is present.  Discharge Diagnoses: Term Pregnancy-delivered  Discharge Information: Date: 11/02/2013 Activity: pelvic rest Diet: routine Medications: PNV and Ibuprofen Condition: stable Instructions: refer to practice specific booklet Discharge to: home   Newborn Data: Live born female  Birth Weight: 6 lb 3.3 oz (2815 g) APGAR: 9, 9  Home with mother.  Fines Kimberlin G 11/02/2013, 8:16 AM

## 2013-11-02 NOTE — Progress Notes (Signed)
Subjective: Postpartum Day 3: Cesarean Delivery Patient reports tolerating PO, + flatus and no problems voiding.  Mild HA this am , no photophobia. Denies jitteriness or RUQ pain  Objective: Vital signs in last 24 hours: Temp:  [98.2 F (36.8 C)-98.4 F (36.9 C)] 98.4 F (36.9 C) (12/23 0700) Pulse Rate:  [94-100] 94 (12/23 0700) Resp:  [18] 18 (12/23 0700) BP: (133-158)/(86-90) 135/86 mmHg (12/23 0700)  Physical Exam:  General: alert and cooperative Lochia: appropriate Uterine Fundus: firm Incision: honeycomb dressing CDI DVT Evaluation: No evidence of DVT seen on physical exam. Negative Homan's sign. No cords or calf tenderness. Calf/Ankle edema is present. DTR's 3+   Recent Labs  10/31/13 0559  HGB 10.7*  HCT 31.4*    Assessment/Plan: Status post Cesarean section. Doing well postoperatively. Admitted for Pre-e, has not required antihypertensives CBC and CMP prior to discharge , if labs normal plan discharge with RTO in 1 week.  Janisse Ghan G 11/02/2013, 8:11 AM

## 2013-11-02 NOTE — Addendum Note (Signed)
Addendum created 11/02/13 1040 by Phillips Grout, MD   Modules edited: Anesthesia Responsible Staff

## 2014-09-12 ENCOUNTER — Encounter (HOSPITAL_COMMUNITY): Payer: Self-pay | Admitting: Obstetrics and Gynecology

## 2014-09-12 ENCOUNTER — Ambulatory Visit: Payer: Self-pay | Admitting: Physician Assistant

## 2014-11-11 HISTORY — PX: DE QUERVAIN'S RELEASE: SHX1439

## 2014-11-11 HISTORY — PX: CARPAL TUNNEL RELEASE: SHX101

## 2015-01-30 ENCOUNTER — Other Ambulatory Visit: Payer: Self-pay | Admitting: Obstetrics and Gynecology

## 2015-01-31 LAB — CYTOLOGY - PAP

## 2015-04-12 ENCOUNTER — Telehealth: Payer: Self-pay | Admitting: Family Medicine

## 2015-04-12 MED ORDER — MONTELUKAST SODIUM 10 MG PO TABS: 10.0000 mg | ORAL_TABLET | Freq: Every day | ORAL | Status: DC

## 2015-04-12 NOTE — Telephone Encounter (Signed)
Spoke with patient. Still on antibiotic, but was improving and now worse again. Will send in Singulair and call  If does not improve.

## 2015-04-12 NOTE — Telephone Encounter (Signed)
Pt states she is still having sinus congestion and having green/yellow phlegm.  Pt is asking if she need another Rx.  [facility]  [phone]/MJ

## 2015-06-13 ENCOUNTER — Ambulatory Visit (INDEPENDENT_AMBULATORY_CARE_PROVIDER_SITE_OTHER): Payer: BC Managed Care – PPO | Admitting: Family Medicine

## 2015-06-13 ENCOUNTER — Encounter: Payer: Self-pay | Admitting: Family Medicine

## 2015-06-13 VITALS — BP 118/68 | HR 76 | Temp 98.0°F | Resp 16 | Ht 63.0 in | Wt 199.0 lb

## 2015-06-13 DIAGNOSIS — J45909 Unspecified asthma, uncomplicated: Secondary | ICD-10-CM | POA: Insufficient documentation

## 2015-06-13 DIAGNOSIS — F4320 Adjustment disorder, unspecified: Secondary | ICD-10-CM | POA: Insufficient documentation

## 2015-06-13 DIAGNOSIS — J309 Allergic rhinitis, unspecified: Secondary | ICD-10-CM | POA: Insufficient documentation

## 2015-06-13 DIAGNOSIS — G43019 Migraine without aura, intractable, without status migrainosus: Secondary | ICD-10-CM | POA: Insufficient documentation

## 2015-06-13 DIAGNOSIS — G43109 Migraine with aura, not intractable, without status migrainosus: Secondary | ICD-10-CM | POA: Diagnosis not present

## 2015-06-13 DIAGNOSIS — Z8739 Personal history of other diseases of the musculoskeletal system and connective tissue: Secondary | ICD-10-CM | POA: Insufficient documentation

## 2015-06-13 DIAGNOSIS — E7800 Pure hypercholesterolemia, unspecified: Secondary | ICD-10-CM | POA: Insufficient documentation

## 2015-06-13 DIAGNOSIS — G43829 Menstrual migraine, not intractable, without status migrainosus: Secondary | ICD-10-CM | POA: Insufficient documentation

## 2015-06-13 DIAGNOSIS — K219 Gastro-esophageal reflux disease without esophagitis: Secondary | ICD-10-CM | POA: Insufficient documentation

## 2015-06-13 MED ORDER — TOPIRAMATE 25 MG PO TABS: 25.0000 mg | ORAL_TABLET | Freq: Two times a day (BID) | ORAL | Status: DC

## 2015-06-13 MED ORDER — SUMATRIPTAN SUCCINATE 50 MG PO TABS: 50.0000 mg | ORAL_TABLET | ORAL | Status: DC | PRN

## 2015-06-13 NOTE — Progress Notes (Signed)
Subjective:    Patient ID: Carrie [name], female    DOB: 21-Nov-1975, 39 y.o.   MRN: [id number]  Migraine  This is a chronic problem. The current episode started 1 to 4 weeks ago. The problem has been gradually worsening. The pain is located in the frontal region. The pain quality is similar to prior headaches. The quality of the pain is described as band-like, stabbing, throbbing, dull and sharp. Associated symptoms include nausea and photophobia. Pertinent negatives include no abdominal pain, coughing, dizziness, ear pain, fever, hearing loss, numbness, rhinorrhea, seizures, sinus pressure, sore throat, tinnitus, vomiting or weakness. Her past medical history is significant for migraine headaches and migraines in the family.   Has history of chronic daily headaches. Also, with history of migraines. Topamax helped in the past. Did not have headaches after pregnancy.   Patient Active Problem List   Diagnosis Date Noted  . 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 Allergic rhinitis 06/13/2015  . H/O arthritis 06/13/2015  . 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 Acid reflux 06/13/2015  . Common migraine with intractable migraine 06/13/2015  . Hypercholesteremia 06/13/2015  . Headache, menstrual migraine 06/13/2015  . Pregnancy 10/30/2013   Family History  Problem Relation Age of Onset  . Arthritis Mother   . Diverticulitis Mother   . Thyroid disease Mother   . GER disease Mother   . Migraines Mother   . Arthritis Father   . Hypertension Father   . Hyperlipidemia Father   . Thyroid disease Father   . Diabetes Maternal Grandmother   . Heart attack Maternal Grandmother   . Alzheimer's disease Maternal Grandmother   . Heart disease Paternal Grandfather   . Heart attack Paternal Grandfather   . Anemia Sister   . Thyroid disease Sister   . Healthy Brother   . Emphysema Maternal Grandfather   . Breast cancer Paternal Grandmother   . Thyroid disease Sister   . Bipolar disorder Sister   .  Arrhythmia Sister     Ventricular Tachycardia   History   Social History  . Marital Status: Married    Spouse Name: N/A  . Number of Children: 1  . Years of Education: Master's   Occupational History  . Part-time Insurance account manager   Social History Main Topics  . Smoking status: Never Smoker   . Smokeless tobacco: Never Used  . Alcohol Use: No  . Drug Use: No  . Sexual Activity: Not Currently     Comment: couple is afraid  to mess something up   Other Topics Concern  . Not on file   Social History Narrative   Past Surgical History  Procedure Laterality Date  . Colposcopy w/ biopsy / curettage    . Ivf    . Dilation and curettage of uterus    . Cesarean section N/A 10/30/2013    Procedure: CESAREAN SECTION;  Surgeon: Meriel Pica, MD;  Location: [facility];  Service: Obstetrics;  Laterality: N/A;  PRIMARY  . Foot fracture surgery Left   . Hand surgery Left     Fracture repair   No Known Allergies Previous Medications   ACETAMINOPHEN (TYLENOL) 500 MG TABLET    Take 1,000 mg by mouth every 6 (six) hours as needed for pain.   ALBUTEROL (PROAIR HFA) 108 (90 BASE) MCG/ACT INHALER    Inhale into the lungs.   ESCITALOPRAM (LEXAPRO) 10 MG TABLET    Take by mouth.   FLUTICASONE (FLONASE) 50 MCG/ACT NASAL SPRAY  Place into the nose.   IBUPROFEN (ADVIL,MOTRIN) 800 MG TABLET    Take 1 tablet (800 mg total) by mouth every 8 (eight) hours as needed for moderate pain.   LANSOPRAZOLE (PREVACID) 15 MG CAPSULE    Take by mouth.   MONTELUKAST (SINGULAIR) 10 MG TABLET    Take 1 tablet (10 mg total) by mouth at bedtime.   PYRIDOXINE (VITAMIN B-6) 100 MG TABLET    Take 100 mg by mouth daily.   SUMATRIPTAN (IMITREX) 25 MG TABLET    Take by mouth.   VITAMIN C (ASCORBIC ACID) 500 MG TABLET    Take 500 mg by mouth daily.    Review of Systems  Constitutional: Negative for fever.  HENT: Negative for congestion, ear discharge, ear pain, facial swelling, hearing loss,  nosebleeds, postnasal drip, rhinorrhea, sinus pressure, sneezing, sore throat, tinnitus, trouble swallowing and voice change.   Eyes: Positive for photophobia. Negative for visual disturbance.  Respiratory: Negative for apnea, cough, choking, chest tightness, shortness of breath, wheezing and stridor.   Cardiovascular: Negative for chest pain, palpitations and leg swelling.  Gastrointestinal: Positive for nausea. Negative for vomiting, abdominal pain, diarrhea, constipation, blood in stool, abdominal distention, anal bleeding and rectal pain.  Neurological: Positive for light-headedness and headaches. Negative for dizziness, tremors, seizures, syncope, facial asymmetry, speech difficulty, weakness and numbness.      Objective:   Physical Exam  Constitutional: She is oriented to person, place, and time. She appears well-developed and well-nourished.  Neurological: She is alert and oriented to person, place, and time.  Psychiatric: She has a normal mood and affect. Her behavior is normal. Judgment and thought content normal.   BP 118/68 mmHg  Pulse 76  Temp(Src) 98 F (36.7 C) (Oral)  Resp 16  Ht  (1.6 m)  Wt 199 lb (90.266 kg)  BMI 35.26 kg/m2     Assessment & Plan:  1. Migraine with aura and without status migrainosus, not intractable Will restart Topamax.  Also refilled Imitrex today. Call if worsens or does not improve.   - SUMAtriptan (IMITREX) 50 MG tablet; Take 1 tablet (50 mg total) by mouth every 2 (two) hours as needed for migraine. May repeat in 2 hours if headache persists or recurs.  Dispense: 10 tablet; Refill: 0  Lorie Phenix, MD

## 2015-08-17 ENCOUNTER — Other Ambulatory Visit: Payer: Self-pay | Admitting: Family Medicine

## 2015-08-17 DIAGNOSIS — G43019 Migraine without aura, intractable, without status migrainosus: Secondary | ICD-10-CM

## 2015-10-11 ENCOUNTER — Ambulatory Visit (INDEPENDENT_AMBULATORY_CARE_PROVIDER_SITE_OTHER): Payer: BC Managed Care – PPO | Admitting: Physician Assistant

## 2015-10-11 ENCOUNTER — Encounter: Payer: Self-pay | Admitting: Physician Assistant

## 2015-10-11 VITALS — BP 100/68 | HR 93 | Temp 98.7°F | Resp 18 | Wt 191.0 lb

## 2015-10-11 DIAGNOSIS — J0140 Acute pansinusitis, unspecified: Secondary | ICD-10-CM | POA: Diagnosis not present

## 2015-10-11 DIAGNOSIS — Z91048 Other nonmedicinal substance allergy status: Secondary | ICD-10-CM | POA: Diagnosis not present

## 2015-10-11 DIAGNOSIS — J40 Bronchitis, not specified as acute or chronic: Secondary | ICD-10-CM | POA: Diagnosis not present

## 2015-10-11 DIAGNOSIS — R05 Cough: Secondary | ICD-10-CM | POA: Diagnosis not present

## 2015-10-11 DIAGNOSIS — Z9109 Other allergy status, other than to drugs and biological substances: Secondary | ICD-10-CM

## 2015-10-11 DIAGNOSIS — R059 Cough, unspecified: Secondary | ICD-10-CM

## 2015-10-11 MED ORDER — MONTELUKAST SODIUM 10 MG PO TABS: 10.0000 mg | ORAL_TABLET | Freq: Every day | ORAL | Status: DC

## 2015-10-11 MED ORDER — BENZONATATE 200 MG PO CAPS: 200.0000 mg | ORAL_CAPSULE | Freq: Two times a day (BID) | ORAL | Status: DC | PRN

## 2015-10-11 MED ORDER — AZITHROMYCIN 250 MG PO TABS: ORAL_TABLET | ORAL | Status: DC

## 2015-10-11 MED ORDER — HYDROCODONE-HOMATROPINE 5-1.5 MG/5ML PO SYRP: 5.0000 mL | ORAL_SOLUTION | Freq: Three times a day (TID) | ORAL | Status: DC | PRN

## 2015-10-11 NOTE — Progress Notes (Signed)
Patient: Carrie KoyanagiLaura [name] Female    DOB: 06/25/1976   39 y.o.   MRN: [id number] Visit Date: 10/11/2015  Today's Provider: Margaretann LovelessJennifer M Burnette, PA-C   Chief Complaint  Patient presents with  . Cough   Subjective:    Cough This is a new problem. The current episode started in the past 7 days. The problem has been gradually worsening. The problem occurs constantly. The cough is productive of sputum and productive of blood-tinged sputum (yellowish/greenish. Per patient has noticed a little bit of blood in her cough for the past three days.). Associated symptoms include ear congestion, headaches, nasal congestion, postnasal drip, rhinorrhea, shortness of breath, sweats (very warm) and wheezing (more when coughing). Pertinent negatives include no chills, fever or sore throat. The symptoms are aggravated by lying down and other (by talking). She has tried OTC cough suppressant (Aleve-D) for the symptoms.       No Known Allergies Previous Medications   ACETAMINOPHEN (TYLENOL) 500 MG TABLET    Take 1,000 mg by mouth every 6 (six) hours as needed for pain.   ALBUTEROL (PROAIR HFA) 108 (90 BASE) MCG/ACT INHALER    Inhale into the lungs.   ESCITALOPRAM (LEXAPRO) 10 MG TABLET    Take by mouth.   FLUTICASONE (FLONASE) 50 MCG/ACT NASAL SPRAY    Place into the nose.   IBUPROFEN (ADVIL,MOTRIN) 800 MG TABLET    Take 1 tablet (800 mg total) by mouth every 8 (eight) hours as needed for moderate pain.   LANSOPRAZOLE (PREVACID) 15 MG CAPSULE    Take by mouth.   MONTELUKAST (SINGULAIR) 10 MG TABLET    Take 1 tablet (10 mg total) by mouth at bedtime.   PYRIDOXINE (VITAMIN B-6) 100 MG TABLET    Take 100 mg by mouth daily.   SUMATRIPTAN (IMITREX) 50 MG TABLET    Take 1 tablet (50 mg total) by mouth every 2 (two) hours as needed for migraine. May repeat in 2 hours if headache persists or recurs.   TOPIRAMATE (TOPAMAX) 25 MG TABLET    TAKE EVERY PM X 7 DAYS THEN TWICE DAILY X 7 DAY THEN 1 IN AM 2 IN PM X 7 DAYS  THEN 2 TWICE DAILY   VITAMIN C (ASCORBIC ACID) 500 MG TABLET    Take 500 mg by mouth daily.    Review of Systems  Constitutional: Positive for fatigue. Negative for fever and chills.  HENT: Positive for congestion, nosebleeds (just when patient cleans her nose), postnasal drip, rhinorrhea and sinus pressure. Negative for sneezing, sore throat and tinnitus.   Eyes: Negative.   Respiratory: Positive for cough, chest tightness, shortness of breath and wheezing (more when coughing). Negative for choking.   Cardiovascular: Negative.   Gastrointestinal: Negative.   Neurological: Positive for headaches.  All other systems reviewed and are negative.   Social History  Substance Use Topics  . Smoking status: Never Smoker   . Smokeless tobacco: Never Used  . Alcohol Use: No   Objective:   BP 100/68 mmHg  Pulse 93  Temp(Src) 98.7 F (37.1 C) (Oral)  Resp 18  Wt 191 lb (86.637 kg)  LMP 09/26/2015  Physical Exam  Constitutional: She appears well-developed and well-nourished. No distress.  HENT:  Head: Normocephalic and atraumatic.  Right Ear: Hearing, tympanic membrane, external ear and ear canal normal.  Left Ear: Hearing, tympanic membrane, external ear and ear canal normal.  Nose: Mucosal edema and rhinorrhea present. Right sinus exhibits maxillary sinus tenderness  and frontal sinus tenderness. Left sinus exhibits maxillary sinus tenderness and frontal sinus tenderness.  Mouth/Throat: Uvula is midline, oropharynx is clear and moist and mucous membranes are normal. No oropharyngeal exudate.  Eyes: Conjunctivae are normal. Pupils are equal, round, and reactive to light. Right eye exhibits no discharge. Left eye exhibits no discharge.  Neck: Normal range of motion. Neck supple. No tracheal deviation present. No thyromegaly present.  Cardiovascular: Normal rate, regular rhythm and normal heart sounds.  Exam reveals no gallop and no friction rub.   No murmur heard. Pulmonary/Chest: Effort  normal. No stridor. No respiratory distress. She has decreased breath sounds (throughout). She has no wheezes. She has no rales.  Lymphadenopathy:    She has no cervical adenopathy.  Skin: She is not diaphoretic.  Vitals reviewed.       Assessment & Plan:     1. Bronchitis Worsening 1 week. She does have history of reactive airway. She does have a rescue inhaler of albuterol. She has not used this since symptoms began. I will treat with azithromycin as below. I also advised her to begin using her albuterol inhaler every 4-6 hours as needed for shortness of breath. She is to call the office if symptoms fail to improve or worsen. May add a prednisone taper if needed or obtain chest x-ray if no improvement. - azithromycin (ZITHROMAX) 250 MG tablet; Take 2 tablets PO on day one, and one tablet PO daily thereafter until completed.  Dispense: 6 tablet; Refill: 0  2. Cough Will treat cough as below with Hycodan cough syrup for nighttime cough and Tessalon Perles for cough during the day. She is to call the office if symptoms fail to improve or worsen. - HYDROcodone-homatropine (HYCODAN) 5-1.5 MG/5ML syrup; Take 5 mLs by mouth every 8 (eight) hours as needed for cough.  Dispense: 180 mL; Refill: 0 - benzonatate (TESSALON) 200 MG capsule; Take 1 capsule (200 mg total) by mouth 2 (two) times daily as needed for cough.  Dispense: 20 capsule; Refill: 0  3. Environmental allergies Diagnoses pulled to refill Singulair as below. - montelukast (SINGULAIR) 10 MG tablet; Take 1 tablet (10 mg total) by mouth at bedtime.  Dispense: 30 tablet; Refill: 6  4. Acute pansinusitis, recurrence not specified Worsening 1 week. She has been using her Flonase and Singulair daily. She states that she get sinus infections from her allergies almost every March and October to November. I will treat with azithromycin as above for the sinus infection and bronchitis. She is to call the office if symptoms fail to improve or  worsen.       Margaretann Loveless, PA-C  [facility]

## 2015-10-11 NOTE — Patient Instructions (Signed)
Acute Bronchitis Bronchitis is inflammation of the airways that extend from the windpipe into the lungs (bronchi). The inflammation often causes mucus to develop. This leads to a cough, which is the most common symptom of bronchitis.  In acute bronchitis, the condition usually develops suddenly and goes away over time, usually in a couple weeks. Smoking, allergies, and asthma can make bronchitis worse. Repeated episodes of bronchitis may cause further lung problems.  CAUSES Acute bronchitis is most often caused by the same virus that causes a cold. The virus can spread from person to person (contagious) through coughing, sneezing, and touching contaminated objects. SIGNS AND SYMPTOMS   Cough.   Fever.   Coughing up mucus.   Body aches.   Chest congestion.   Chills.   Shortness of breath.   Sore throat.  DIAGNOSIS  Acute bronchitis is usually diagnosed through a physical exam. Your health care provider will also ask you questions about your medical history. Tests, such as chest X-rays, are sometimes done to rule out other conditions.  TREATMENT  Acute bronchitis usually goes away in a couple weeks. Oftentimes, no medical treatment is necessary. Medicines are sometimes given for relief of fever or cough. Antibiotic medicines are usually not needed but may be prescribed in certain situations. In some cases, an inhaler may be recommended to help reduce shortness of breath and control the cough. A cool mist vaporizer may also be used to help thin bronchial secretions and make it easier to clear the chest.  HOME CARE INSTRUCTIONS  Get plenty of rest.   Drink enough fluids to keep your urine clear or pale yellow (unless you have a medical condition that requires fluid restriction). Increasing fluids may help thin your respiratory secretions (sputum) and reduce chest congestion, and it will prevent dehydration.   Take medicines only as directed by your health care provider.  If  you were prescribed an antibiotic medicine, finish it all even if you start to feel better.  Avoid smoking and secondhand smoke. Exposure to cigarette smoke or irritating chemicals will make bronchitis worse. If you are a smoker, consider using nicotine gum or skin patches to help control withdrawal symptoms. Quitting smoking will help your lungs heal faster.   Reduce the chances of another bout of acute bronchitis by washing your hands frequently, avoiding people with cold symptoms, and trying not to touch your hands to your mouth, nose, or eyes.   Keep all follow-up visits as directed by your health care provider.  SEEK MEDICAL CARE IF: Your symptoms do not improve after 1 week of treatment.  SEEK IMMEDIATE MEDICAL CARE IF:  You develop an increased fever or chills.   You have chest pain.   You have severe shortness of breath.  You have bloody sputum.   You develop dehydration.  You faint or repeatedly feel like you are going to pass out.  You develop repeated vomiting.  You develop a severe headache. MAKE SURE YOU:   Understand these instructions.  Will watch your condition.  Will get help right away if you are not doing well or get worse.   This information is not intended to replace advice given to you by your health care provider. Make sure you discuss any questions you have with your health care provider.   Document Released: 12/05/2004 Document Revised: 11/18/2014 Document Reviewed: 04/20/2013 Elsevier Interactive Patient Education 2016 Elsevier Inc. Sinusitis, Adult Sinusitis is redness, soreness, and inflammation of the paranasal sinuses. Paranasal sinuses are air pockets within the   bones of your face. They are located beneath your eyes, in the middle of your forehead, and above your eyes. In healthy paranasal sinuses, mucus is able to drain out, and air is able to circulate through them by way of your nose. However, when your paranasal sinuses are inflamed,  mucus and air can become trapped. This can allow bacteria and other germs to grow and cause infection. Sinusitis can develop quickly and last only a short time (acute) or continue over a long period (chronic). Sinusitis that lasts for more than 12 weeks is considered chronic. CAUSES Causes of sinusitis include:  Allergies.  Structural abnormalities, such as displacement of the cartilage that separates your nostrils (deviated septum), which can decrease the air flow through your nose and sinuses and affect sinus drainage.  Functional abnormalities, such as when the small hairs (cilia) that line your sinuses and help remove mucus do not work properly or are not present. SIGNS AND SYMPTOMS Symptoms of acute and chronic sinusitis are the same. The primary symptoms are pain and pressure around the affected sinuses. Other symptoms include:  Upper toothache.  Earache.  Headache.  Bad breath.  Decreased sense of smell and taste.  A cough, which worsens when you are lying flat.  Fatigue.  Fever.  Thick drainage from your nose, which often is green and may contain pus (purulent).  Swelling and warmth over the affected sinuses. DIAGNOSIS Your health care provider will perform a physical exam. During your exam, your health care provider may perform any of the following to help determine if you have acute sinusitis or chronic sinusitis:  Look in your nose for signs of abnormal growths in your nostrils (nasal polyps).  Tap over the affected sinus to check for signs of infection.  View the inside of your sinuses using an imaging device that has a light attached (endoscope). If your health care provider suspects that you have chronic sinusitis, one or more of the following tests may be recommended:  Allergy tests.  Nasal culture. A sample of mucus is taken from your nose, sent to a lab, and screened for bacteria.  Nasal cytology. A sample of mucus is taken from your nose and examined by  your health care provider to determine if your sinusitis is related to an allergy. TREATMENT Most cases of acute sinusitis are related to a viral infection and will resolve on their own within 10 days. Sometimes, medicines are prescribed to help relieve symptoms of both acute and chronic sinusitis. These may include pain medicines, decongestants, nasal steroid sprays, or saline sprays. However, for sinusitis related to a bacterial infection, your health care provider will prescribe antibiotic medicines. These are medicines that will help kill the bacteria causing the infection. Rarely, sinusitis is caused by a fungal infection. In these cases, your health care provider will prescribe antifungal medicine. For some cases of chronic sinusitis, surgery is needed. Generally, these are cases in which sinusitis recurs more than 3 times per year, despite other treatments. HOME CARE INSTRUCTIONS  Drink plenty of water. Water helps thin the mucus so your sinuses can drain more easily.  Use a humidifier.  Inhale steam 3-4 times a day (for example, sit in the bathroom with the shower running).  Apply a warm, moist washcloth to your face 3-4 times a day, or as directed by your health care provider.  Use saline nasal sprays to help moisten and clean your sinuses.  Take medicines only as directed by your health care provider.  If   you were prescribed either an antibiotic or antifungal medicine, finish it all even if you start to feel better. SEEK IMMEDIATE MEDICAL CARE IF:  You have increasing pain or severe headaches.  You have nausea, vomiting, or drowsiness.  You have swelling around your face.  You have vision problems.  You have a stiff neck.  You have difficulty breathing.   This information is not intended to replace advice given to you by your health care provider. Make sure you discuss any questions you have with your health care provider.   Document Released: 10/28/2005 Document  Revised: 11/18/2014 Document Reviewed: 11/12/2011 Elsevier Interactive Patient Education 2016 Elsevier Inc.  

## 2015-10-18 ENCOUNTER — Other Ambulatory Visit: Payer: Self-pay | Admitting: Family Medicine

## 2015-10-18 DIAGNOSIS — G43019 Migraine without aura, intractable, without status migrainosus: Secondary | ICD-10-CM

## 2015-10-25 ENCOUNTER — Encounter: Payer: Self-pay | Admitting: Physician Assistant

## 2015-10-25 DIAGNOSIS — J0140 Acute pansinusitis, unspecified: Secondary | ICD-10-CM

## 2015-10-25 MED ORDER — AZITHROMYCIN 250 MG PO TABS: ORAL_TABLET | ORAL | Status: DC

## 2015-10-31 MED ORDER — FLUTICASONE PROPIONATE 50 MCG/ACT NA SUSP: 2.0000 | Freq: Every day | NASAL | Status: DC

## 2015-10-31 MED ORDER — PREDNISONE 10 MG (21) PO TBPK: ORAL_TABLET | ORAL | Status: DC

## 2015-10-31 MED ORDER — AMOXICILLIN-POT CLAVULANATE 875-125 MG PO TABS: 1.0000 | ORAL_TABLET | Freq: Two times a day (BID) | ORAL | Status: DC

## 2015-10-31 NOTE — Addendum Note (Signed)
Addended by: Margaretann LovelessBURNETTE, JENNIFER M on: 10/31/2015 09:36 AM   Modules accepted: Orders

## 2015-10-31 NOTE — Telephone Encounter (Signed)
I think we should try augmentin and prednisone for the sinus infection since it has been resistant to azithromycin.  I also advise to continue claritin and flonase as well as the netti pot to prevent secondary recurrence from uncontrolled allergy symptoms from the weather changes.

## 2015-11-10 ENCOUNTER — Ambulatory Visit (INDEPENDENT_AMBULATORY_CARE_PROVIDER_SITE_OTHER): Payer: BC Managed Care – PPO | Admitting: Family Medicine

## 2015-11-10 ENCOUNTER — Encounter: Payer: Self-pay | Admitting: Family Medicine

## 2015-11-10 VITALS — BP 110/70 | HR 88 | Temp 97.8°F | Resp 16 | Wt 195.0 lb

## 2015-11-10 DIAGNOSIS — J0141 Acute recurrent pansinusitis: Secondary | ICD-10-CM

## 2015-11-10 MED ORDER — LEVOFLOXACIN 500 MG PO TABS: 500.0000 mg | ORAL_TABLET | Freq: Every day | ORAL | Status: DC

## 2015-11-10 MED ORDER — PREDNISONE 10 MG PO TABS: ORAL_TABLET | ORAL | Status: DC

## 2015-11-10 NOTE — Progress Notes (Signed)
Subjective:    Patient ID: Carrie [name], female    DOB: 02-05-1976, 39 y.o.   MRN: [id number]  URI  This is a recurrent problem. The current episode started more than 1 month ago ("since Thanksgiving"). The problem has been unchanged. There has been no fever. Associated symptoms include congestion, coughing (productive with yellow/green sputum), ear pain (right ear), headaches, a plugged ear sensation, rhinorrhea, sinus pain and sneezing. Pertinent negatives include no abdominal pain, chest pain, diarrhea, dysuria, joint pain, joint swelling, nausea, neck pain, sore throat, swollen glands, vomiting or wheezing. Treatments tried: Antibiotics, including Augmentin and Z Pak, pt has tried [employer], [employer], and Prednisone  The treatment provided no relief.      Review of Systems  HENT: Positive for congestion, ear pain (right ear), rhinorrhea and sneezing. Negative for sore throat.   Respiratory: Positive for cough (productive with yellow/green sputum). Negative for wheezing.   Cardiovascular: Negative for chest pain.  Gastrointestinal: Negative for nausea, vomiting, abdominal pain and diarrhea.  Genitourinary: Negative for dysuria.  Musculoskeletal: Negative for joint pain and neck pain.  Neurological: Positive for headaches.   BP 110/70 mmHg  Pulse 88  Temp(Src) 97.8 F (36.6 C) (Oral)  Resp 16  Wt 195 lb (88.451 kg)  SpO2 98%  LMP 10/26/2015 (Within Days)   Patient Active Problem List   Diagnosis Date Noted  . 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 Allergic rhinitis 06/13/2015  . H/O arthritis 06/13/2015  . 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 Acid reflux 06/13/2015  . Common migraine with intractable migraine 06/13/2015  . Hypercholesteremia 06/13/2015  . Headache, menstrual migraine 06/13/2015  . Pregnancy 10/30/2013   Past Medical History  Diagnosis Date  . Headache(784.0)   . Abnormal Pap smear   . Arthritis     t-12  . PONV (postoperative nausea and vomiting)   .  Asthma     exercised induced   Current Outpatient Prescriptions on File Prior to Visit  Medication Sig  . acetaminophen (TYLENOL) 500 MG tablet Take 1,000 mg by mouth every 6 (six) hours as needed for pain.  Marland Kitchen albuterol (PROAIR HFA) 108 (90 BASE) MCG/ACT inhaler Inhale into the lungs.  Marland Kitchen escitalopram (LEXAPRO) 10 MG tablet Take by mouth.  . fluticasone (FLONASE) 50 MCG/ACT nasal spray Place 2 sprays into both nostrils daily.  Marland Kitchen HYDROcodone-homatropine (HYCODAN) 5-1.5 MG/5ML syrup Take 5 mLs by mouth every 8 (eight) hours as needed for cough.  Marland Kitchen ibuprofen (ADVIL,MOTRIN) 800 MG tablet Take 1 tablet (800 mg total) by mouth every 8 (eight) hours as needed for moderate pain.  Marland Kitchen lansoprazole (PREVACID) 15 MG capsule Take by mouth.  . montelukast (SINGULAIR) 10 MG tablet Take 1 tablet (10 mg total) by mouth at bedtime.  . pyridOXINE (VITAMIN B-6) 100 MG tablet Take 100 mg by mouth daily.  . SUMAtriptan (IMITREX) 50 MG tablet Take 1 tablet (50 mg total) by mouth every 2 (two) hours as needed for migraine. May repeat in 2 hours if headache persists or recurs.  . topiramate (TOPAMAX) 25 MG tablet TAKE 2 TABLETS BY MOUTH TWICE A DAY  . amoxicillin-clavulanate (AUGMENTIN) 875-125 MG tablet Take 1 tablet by mouth 2 (two) times daily. (Patient not taking: Reported on 11/10/2015)  . azithromycin (ZITHROMAX) 250 MG tablet Take 2 tablets PO on day one, and one tablet PO daily thereafter until completed. (Patient not taking: Reported on 11/10/2015)  . benzonatate (TESSALON) 200 MG capsule Take 1 capsule (200 mg total) by mouth 2 (two) times daily  as needed for cough. (Patient not taking: Reported on 11/10/2015)  . predniSONE (STERAPRED UNI-PAK 21 TAB) 10 MG (21) TBPK tablet Take as directed on package directions. (Patient not taking: Reported on 11/10/2015)   No current facility-administered medications on file prior to visit.   No Known Allergies Past Surgical History  Procedure Laterality Date  .  Colposcopy w/ biopsy / curettage    . Ivf    . Dilation and curettage of uterus    . Cesarean section N/A 10/30/2013    Procedure: CESAREAN SECTION;  Surgeon: Meriel Pica, MD;  Location: [facility];  Service: Obstetrics;  Laterality: N/A;  PRIMARY  . Foot fracture surgery Left   . Hand surgery Left     Fracture repair   Social History   Social History  . Marital Status: Married    Spouse Name: N/A  . Number of Children: 1  . Years of Education: Master's   Occupational History  . Part-time Insurance account manager   Social History Main Topics  . Smoking status: Never Smoker   . Smokeless tobacco: Never Used  . Alcohol Use: No  . Drug Use: No  . Sexual Activity: Not Currently     Comment: couple is afraid  to mess something up   Other Topics Concern  . Not on file   Social History Narrative   Family History  Problem Relation Age of Onset  . Arthritis Mother   . Diverticulitis Mother   . Thyroid disease Mother   . GER disease Mother   . Migraines Mother   . Arthritis Father   . Hypertension Father   . Hyperlipidemia Father   . Thyroid disease Father   . Diabetes Maternal Grandmother   . Heart attack Maternal Grandmother   . Alzheimer's disease Maternal Grandmother   . Heart disease Paternal Grandfather   . Heart attack Paternal Grandfather   . Anemia Sister   . Thyroid disease Sister   . Healthy Brother   . Emphysema Maternal Grandfather   . Breast cancer Paternal Grandmother   . Thyroid disease Sister   . Bipolar disorder Sister   . Arrhythmia Sister     Ventricular Tachycardia    Objective:   Physical Exam  Constitutional: She appears well-developed and well-nourished.  HENT:  Head: Normocephalic and atraumatic.  Right Ear: External ear normal.  Left Ear: External ear normal.  Mouth/Throat: Oropharynx is clear and moist.  Sinuses swollen and erythematous   Eyes: Conjunctivae are normal.  Cardiovascular: Normal rate and regular rhythm.     Pulmonary/Chest: Effort normal and breath sounds normal. No respiratory distress. She has no wheezes.  Psychiatric: She has a normal mood and affect. Her behavior is normal.   BP 110/70 mmHg  Pulse 88  Temp(Src) 97.8 F (36.6 C) (Oral)  Resp 16  Wt 195 lb (88.451 kg)  SpO2 98%  LMP 10/26/2015 (Within Days)     Assessment & Plan:  1. Acute recurrent pansinusitis Worsening. Treat aggressively as below. Obtain cx to R/O MRSA infection. FU pending results.  - levofloxacin (LEVAQUIN) 500 MG tablet; Take 1 tablet (500 mg total) by mouth daily.  Dispense: 10 tablet; Refill: 0 - predniSONE (DELTASONE) 10 MG tablet; 6 po for 2 days and then 5 po for 2 days and then 4 po for 2 days and 3 po for 2 days and then 2 po for 2 days and then 1 po for 2 days.  Dispense: 42 tablet; Refill:  0 - Aerobic Culture  Patient seen and examined by Leo GrosserNancy J. Breven Guidroz, MD, and note scribed by Allene DillonEmily Drozdowski, CMA.  I have reviewed the document for accuracy and completeness and I agree with above. Leo Grosser- Azelyn Batie J. Aniko Finnigan, MD ..Lorie PhenixNancy Isobella Ascher, MD

## 2015-11-13 LAB — AEROBIC CULTURE

## 2015-11-14 ENCOUNTER — Telehealth: Payer: Self-pay

## 2015-11-14 NOTE — Telephone Encounter (Signed)
Pt advised as directed below; she reports feeling some better.   Thanks,   -Carrie RiegerLaura

## 2015-11-14 NOTE — Telephone Encounter (Signed)
-----   Message from Lorie PhenixNancy Maloney, MD sent at 11/13/2015  7:18 PM EST ----- Does have heavy growth of bacteria that should be sensitive to Levaquin. Not MRSA. Please see how patient is doing. Thanks.

## 2015-12-18 ENCOUNTER — Other Ambulatory Visit: Payer: Self-pay | Admitting: Family Medicine

## 2015-12-18 DIAGNOSIS — G43019 Migraine without aura, intractable, without status migrainosus: Secondary | ICD-10-CM

## 2016-01-08 ENCOUNTER — Encounter: Payer: Self-pay | Admitting: Physician Assistant

## 2016-01-08 ENCOUNTER — Ambulatory Visit (INDEPENDENT_AMBULATORY_CARE_PROVIDER_SITE_OTHER): Payer: BC Managed Care – PPO | Admitting: Physician Assistant

## 2016-01-08 VITALS — BP 110/70 | HR 95 | Temp 98.8°F | Resp 16 | Wt 192.2 lb

## 2016-01-08 DIAGNOSIS — R6889 Other general symptoms and signs: Secondary | ICD-10-CM | POA: Diagnosis not present

## 2016-01-08 DIAGNOSIS — J101 Influenza due to other identified influenza virus with other respiratory manifestations: Secondary | ICD-10-CM | POA: Diagnosis not present

## 2016-01-08 LAB — POCT INFLUENZA A/B
Influenza A, POC: POSITIVE — AB
Influenza B, POC: NEGATIVE

## 2016-01-08 MED ORDER — OSELTAMIVIR PHOSPHATE 75 MG PO CAPS: 75.0000 mg | ORAL_CAPSULE | Freq: Two times a day (BID) | ORAL | Status: DC

## 2016-01-08 NOTE — Patient Instructions (Signed)

## 2016-01-08 NOTE — Progress Notes (Signed)
Patient: Carrie [name] Female    DOB: 09-09-1976   40 y.o.   MRN: [id number] Visit Date: 01/08/2016  Today's Provider: Margaretann Loveless, PA-C   Chief Complaint  Patient presents with  . Fever  . Cough   Subjective:    Fever  This is a new problem. The current episode started yesterday (yesterday afternoon like around 2 pm). The problem occurs constantly. The problem has been gradually worsening. The maximum temperature noted was 100 to 100.9 F (highest has been 101.2). The temperature was taken using a tympanic thermometer. Associated symptoms include congestion, coughing, diarrhea (today) and nausea. Pertinent negatives include no ear pain, headaches, muscle aches, rash, sore throat or wheezing. Associated symptoms comments: Patient checked her temperature before coming here and it was 100.5. She has tried fluids and acetaminophen for the symptoms. The treatment provided mild relief.  Cough This is a new problem. The current episode started 1 to 4 weeks ago (Last week). The problem has been gradually worsening. The problem occurs constantly. The cough is productive of sputum. Associated symptoms include chills, a fever, nasal congestion, rhinorrhea, shortness of breath and sweats. Pertinent negatives include no ear congestion, ear pain, headaches, rash, sore throat or wheezing. The symptoms are aggravated by lying down (with talking). Treatments tried: Hycodan that was prescribed last time. The treatment provided no relief.  Patient got her influenza vaccine 12/28/15 at the [facility] pharmacy. 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     No Known Allergies Previous Medications   ACETAMINOPHEN (TYLENOL) 500 MG TABLET    Take 1,000 mg by mouth every 6 (six) hours as needed for pain.   ALBUTEROL (PROAIR HFA) 108 (90 BASE) MCG/ACT INHALER    Inhale into the lungs.   AZITHROMYCIN (ZITHROMAX) 250 MG TABLET    Take 2 tablets PO on day one, and one  tablet PO daily thereafter until completed.   ESCITALOPRAM (LEXAPRO) 10 MG TABLET    Take by mouth.   FLUTICASONE (FLONASE) 50 MCG/ACT NASAL SPRAY    Place 2 sprays into both nostrils daily.   HYDROCODONE-HOMATROPINE (HYCODAN) 5-1.5 MG/5ML SYRUP    Take 5 mLs by mouth every 8 (eight) hours as needed for cough.   IBUPROFEN (ADVIL,MOTRIN) 800 MG TABLET    Take 1 tablet (800 mg total) by mouth every 8 (eight) hours as needed for moderate pain.   LANSOPRAZOLE (PREVACID) 15 MG CAPSULE    Take by mouth.   LEVOFLOXACIN (LEVAQUIN) 500 MG TABLET    Take 1 tablet (500 mg total) by mouth daily.   LORATADINE (CLARITIN) 10 MG TABLET    Take 10 mg by mouth daily.   MONTELUKAST (SINGULAIR) 10 MG TABLET    Take 1 tablet (10 mg total) by mouth at bedtime.   PREDNISONE (DELTASONE) 10 MG TABLET    6 po for 2 days and then 5 po for 2 days and then 4 po for 2 days and 3 po for 2 days and then 2 po for 2 days and then 1 po for 2 days.   PYRIDOXINE (VITAMIN B-6) 100 MG TABLET    Take 100 mg by mouth daily. Reported on 01/08/2016   SUMATRIPTAN (IMITREX) 50 MG TABLET    Take 1 tablet (50 mg total) by mouth every 2 (two) hours as needed for migraine. May repeat in 2 hours if headache persists or recurs.   TOPIRAMATE (TOPAMAX) 25 MG TABLET  TAKE 2 TABLETS BY MOUTH TWICE A DAY    Review of Systems  Constitutional: Positive for fever and chills.  HENT: Positive for congestion, rhinorrhea and sinus pressure. Negative for ear pain, sneezing and sore throat.   Respiratory: Positive for cough and shortness of breath. Negative for chest tightness and wheezing.   Cardiovascular: Negative.   Gastrointestinal: Positive for nausea and diarrhea (today).  Skin: Negative for rash.  Neurological: Negative for headaches.    Social History  Substance Use Topics  . Smoking status: Never Smoker   . Smokeless tobacco: Never Used  . Alcohol Use: No   Objective:   BP 110/70 mmHg  Pulse 95  Temp(Src) 98.8 F (37.1 C) (Oral)   Resp 16  Wt 192 lb 3.2 oz (87.181 kg)  SpO2 98%  LMP 12/17/2015  Physical Exam  Constitutional: She appears well-developed and well-nourished. No distress.  HENT:  Head: Normocephalic and atraumatic.  Right Ear: Hearing, tympanic membrane, external ear and ear canal normal. No middle ear effusion.  Left Ear: Hearing, external ear and ear canal normal. Tympanic membrane is not perforated, not erythematous and not bulging. A middle ear effusion is present.  Nose: Right sinus exhibits maxillary sinus tenderness. Right sinus exhibits no frontal sinus tenderness. Left sinus exhibits maxillary sinus tenderness. Left sinus exhibits no frontal sinus tenderness.  Mouth/Throat: Uvula is midline, oropharynx is clear and moist and mucous membranes are normal. No oropharyngeal exudate, posterior oropharyngeal edema or posterior oropharyngeal erythema.  Neck: Normal range of motion. Neck supple. No tracheal deviation present. No thyromegaly present.  Cardiovascular: Normal rate, regular rhythm and normal heart sounds.  Exam reveals no gallop and no friction rub.   No murmur heard. Pulmonary/Chest: Effort normal and breath sounds normal. No stridor. No respiratory distress. She has no wheezes. She has no rales.  Lymphadenopathy:    She has no cervical adenopathy.  Skin: She is not diaphoretic.  Vitals reviewed.       Assessment & Plan:     1. Influenza A Flu test was positive for influenza a. Will treat with tamiflu as below since she is still within treatment window.  Advised to take tylenol and IBU intermittently as needed for fevers. Continue to push fluids. Try to get as much rest as possible. She is to continue her medications for allergic rhinitis as well to prevent worsening sinus infection. She is to call the office if symptoms worsen or persist. - oseltamivir (TAMIFLU) 75 MG capsule; Take 1 capsule (75 mg total) by mouth 2 (two) times daily.  Dispense: 14 capsule; Refill: 0  2. Flu-like  symptoms Influenza A positive. - POCT Influenza A/B       Margaretann Loveless, PA-C  [facility]

## 2016-01-15 ENCOUNTER — Encounter: Payer: Self-pay | Admitting: Physician Assistant

## 2016-01-22 ENCOUNTER — Encounter: Payer: Self-pay | Admitting: Family Medicine

## 2016-01-22 ENCOUNTER — Ambulatory Visit (INDEPENDENT_AMBULATORY_CARE_PROVIDER_SITE_OTHER): Payer: BC Managed Care – PPO | Admitting: Family Medicine

## 2016-01-22 VITALS — BP 112/64 | HR 64 | Temp 98.5°F | Resp 16 | Wt 194.0 lb

## 2016-01-22 DIAGNOSIS — J0101 Acute recurrent maxillary sinusitis: Secondary | ICD-10-CM | POA: Diagnosis not present

## 2016-01-22 MED ORDER — CEFDINIR 300 MG PO CAPS: 600.0000 mg | ORAL_CAPSULE | Freq: Every day | ORAL | Status: DC

## 2016-01-22 NOTE — Progress Notes (Signed)
Patient: Carrie [name] Female    DOB: [birth year]   40 y.o.   MRN: [id number] Visit Date: 01/22/2016  Today's Provider: Lorie Phenix, MD   Chief Complaint  Patient presents with  . Sinusitis   Subjective:    Sinusitis This is a new problem. The current episode started 1 to 4 weeks ago. The problem has been gradually worsening since onset. There has been no fever. Associated symptoms include congestion, coughing, headaches, shortness of breath, sinus pressure, sneezing and a sore throat. Pertinent negatives include no chills, diaphoresis or ear pain. Past treatments include oral decongestants, spray decongestants and acetaminophen. The treatment provided no relief.  URI  This is a new problem. The current episode started 1 to 4 weeks ago. The problem has been gradually worsening. There has been no fever. Associated symptoms include congestion, coughing, headaches, a plugged ear sensation, rhinorrhea, sinus pain, sneezing, a sore throat and wheezing. Pertinent negatives include no abdominal pain, chest pain, diarrhea, ear pain, nausea or vomiting. She has tried antihistamine, decongestant and NSAIDs for the symptoms. The treatment provided no relief.       No Known Allergies Previous Medications   ACETAMINOPHEN (TYLENOL) 500 MG TABLET    Take 1,000 mg by mouth every 6 (six) hours as needed for pain.   ALBUTEROL (PROAIR HFA) 108 (90 BASE) MCG/ACT INHALER    Inhale into the lungs.   ESCITALOPRAM (LEXAPRO) 10 MG TABLET    Take by mouth.   FLUTICASONE (FLONASE) 50 MCG/ACT NASAL SPRAY    Place 2 sprays into both nostrils daily.   HYDROCODONE-HOMATROPINE (HYCODAN) 5-1.5 MG/5ML SYRUP    Take 5 mLs by mouth every 8 (eight) hours as needed for cough.   IBUPROFEN (ADVIL,MOTRIN) 800 MG TABLET    Take 1 tablet (800 mg total) by mouth every 8 (eight) hours as needed for moderate pain.   LANSOPRAZOLE (PREVACID) 15 MG CAPSULE    Take by mouth.   LORATADINE (CLARITIN) 10 MG TABLET    Take 10 mg  by mouth daily.   MONTELUKAST (SINGULAIR) 10 MG TABLET    Take 1 tablet (10 mg total) by mouth at bedtime.   OSELTAMIVIR (TAMIFLU) 75 MG CAPSULE    Take 1 capsule (75 mg total) by mouth 2 (two) times daily.   PYRIDOXINE (VITAMIN B-6) 100 MG TABLET    Take 100 mg by mouth daily. Reported on 01/08/2016   SUMATRIPTAN (IMITREX) 50 MG TABLET    Take 1 tablet (50 mg total) by mouth every 2 (two) hours as needed for migraine. May repeat in 2 hours if headache persists or recurs.   TOPIRAMATE (TOPAMAX) 25 MG TABLET    TAKE 2 TABLETS BY MOUTH TWICE A DAY    Review of Systems  Constitutional: Positive for fatigue. Negative for fever, chills, diaphoresis, activity change, appetite change and unexpected weight change.  HENT: Positive for congestion, nosebleeds, postnasal drip, rhinorrhea, sinus pressure, sneezing and sore throat. Negative for ear discharge, ear pain, tinnitus and voice change.   Eyes: Negative for photophobia, pain, discharge, redness, itching and visual disturbance.  Respiratory: Positive for cough, chest tightness, shortness of breath and wheezing. Negative for apnea, choking and stridor.   Cardiovascular: Negative for chest pain, palpitations and leg swelling.  Gastrointestinal: Negative.  Negative for nausea, vomiting, abdominal pain and diarrhea.  Neurological: Positive for headaches. Negative for dizziness and light-headedness.    Social History  Substance Use Topics  . Smoking status: Never Smoker   .  Smokeless tobacco: Never Used  . Alcohol Use: No   Objective:   BP 112/64 mmHg  Pulse 64  Temp(Src) 98.5 F (36.9 C) (Oral)  Resp 16  Wt 194 lb (87.998 kg)  SpO2 99%  LMP 01/14/2016 (Approximate)  Physical Exam  Constitutional: She is oriented to person, place, and time. She appears well-developed and well-nourished.  HENT:  Head: Normocephalic and atraumatic.  Right Ear: Tympanic membrane, external ear and ear canal normal.  Left Ear: Tympanic membrane, external ear  and ear canal normal.  Nose: Mucosal edema and rhinorrhea present.  Mouth/Throat: Uvula is midline, oropharynx is clear and moist and mucous membranes are normal.  Cardiovascular: Normal rate, regular rhythm, normal heart sounds and intact distal pulses.   Pulmonary/Chest: Effort normal and breath sounds normal.  Neurological: She is alert and oriented to person, place, and time.  Psychiatric: She has a normal mood and affect. Her behavior is normal. Judgment and thought content normal.        Assessment & Plan:      1. Acute recurrent maxillary sinusitis Worsening, will start omnicef as below and refer to [facility] to evaluate and treat.  - cefdinir (OMNICEF) 300 MG capsule; Take 2 capsules (600 mg total) by mouth daily.  Dispense: 25 capsule; Refill: 0 - Ambulatory referral to ENT     Patient was seen and examined by Leo GrosserNancy J. Shaun Zuccaro, MD, and note scribed by Kavin LeechLaura Walsh, CMA.  I have reviewed the document for accuracy and completeness and I agree with above. - Leo GrosserNancy J. Aashrith Eves, MD  Lorie PhenixNancy Shonique Pelphrey, MD  [facility]

## 2016-04-16 ENCOUNTER — Encounter: Payer: Self-pay | Admitting: Physician Assistant

## 2016-04-16 ENCOUNTER — Ambulatory Visit (INDEPENDENT_AMBULATORY_CARE_PROVIDER_SITE_OTHER): Payer: BC Managed Care – PPO | Admitting: Physician Assistant

## 2016-04-16 VITALS — BP 120/80 | HR 71 | Temp 98.2°F | Resp 18 | Wt 191.6 lb

## 2016-04-16 DIAGNOSIS — R05 Cough: Secondary | ICD-10-CM | POA: Diagnosis not present

## 2016-04-16 DIAGNOSIS — J40 Bronchitis, not specified as acute or chronic: Secondary | ICD-10-CM

## 2016-04-16 DIAGNOSIS — R059 Cough, unspecified: Secondary | ICD-10-CM

## 2016-04-16 MED ORDER — AMOXICILLIN-POT CLAVULANATE 875-125 MG PO TABS: 1.0000 | ORAL_TABLET | Freq: Two times a day (BID) | ORAL | Status: DC

## 2016-04-16 MED ORDER — HYDROCODONE-HOMATROPINE 5-1.5 MG/5ML PO SYRP: 5.0000 mL | ORAL_SOLUTION | Freq: Three times a day (TID) | ORAL | Status: DC | PRN

## 2016-04-16 MED ORDER — PREDNISONE 10 MG PO TABS: ORAL_TABLET | ORAL | Status: DC

## 2016-04-16 NOTE — Patient Instructions (Signed)

## 2016-04-16 NOTE — Progress Notes (Signed)
Patient: Carrie [name] Female    DOB: 02/01/1976   40 y.o.   MRN: [id number] Visit Date: 04/16/2016  Today's Provider: Margaretann LovelessJennifer M Laquesha Holcomb, PA-C   Chief Complaint  Patient presents with  . URI   Subjective:    URI  This is a new problem. The current episode started in the past 7 days (She reports that she had this same symptoms afew weeks ago but it started on Thursday last week again.). The problem has been gradually worsening. There has been no fever. Associated symptoms include congestion, coughing, headaches, nausea (when she coughs too much), rhinorrhea (a little), sinus pain and wheezing. Pertinent negatives include no abdominal pain, chest pain, sneezing, sore throat or vomiting. She has tried decongestant, antihistamine and inhaler use (Aleve-D, Claritin, ProAir,Hycodan, and her husband gave her Breo low dose and started the high dose 3 days ago) for the symptoms. The treatment provided no relief.       No Known Allergies Previous Medications   ACETAMINOPHEN (TYLENOL) 500 MG TABLET    Take 1,000 mg by mouth every 6 (six) hours as needed for pain.   ALBUTEROL (PROAIR HFA) 108 (90 BASE) MCG/ACT INHALER    Inhale into the lungs.   BUPROPION (WELLBUTRIN XL) 150 MG 24 HR TABLET       CEFDINIR (OMNICEF) 300 MG CAPSULE    Take 2 capsules (600 mg total) by mouth daily.   FLUTICASONE (FLONASE) 50 MCG/ACT NASAL SPRAY    Place 2 sprays into both nostrils daily.   HYDROCODONE-HOMATROPINE (HYCODAN) 5-1.5 MG/5ML SYRUP    Take 5 mLs by mouth every 8 (eight) hours as needed for cough.   IBUPROFEN (ADVIL,MOTRIN) 800 MG TABLET    Take 1 tablet (800 mg total) by mouth every 8 (eight) hours as needed for moderate pain.   LANSOPRAZOLE (PREVACID) 15 MG CAPSULE    Take by mouth.   LORATADINE (CLARITIN) 10 MG TABLET    Take 10 mg by mouth daily.   MONTELUKAST (SINGULAIR) 10 MG TABLET    Take 1 tablet (10 mg total) by mouth at bedtime.   SUMATRIPTAN (IMITREX) 50 MG TABLET    Take 1 tablet (50 mg  total) by mouth every 2 (two) hours as needed for migraine. May repeat in 2 hours if headache persists or recurs.   TOPIRAMATE (TOPAMAX) 25 MG TABLET    TAKE 2 TABLETS BY MOUTH TWICE A DAY   VITAMIN D, ERGOCALCIFEROL, (DRISDOL) 50000 UNITS CAPS CAPSULE    TAKE 1 CAPSULE WEEKLY.    Review of Systems  Constitutional: Positive for fatigue. Negative for fever and chills.  HENT: Positive for congestion, postnasal drip, rhinorrhea (a little) and sinus pressure. Negative for sneezing, sore throat and trouble swallowing.   Eyes: Negative.   Respiratory: Positive for cough, chest tightness (reports that her chest feels warm since yesterday), shortness of breath and wheezing.   Cardiovascular: Negative for chest pain and leg swelling.  Gastrointestinal: Positive for nausea (when she coughs too much). Negative for vomiting and abdominal pain.  Neurological: Positive for headaches. Negative for dizziness.    Social History  Substance Use Topics  . Smoking status: Never Smoker   . Smokeless tobacco: Never Used  . Alcohol Use: No   Objective:   BP 120/80 mmHg  Pulse 71  Temp(Src) 98.2 F (36.8 C) (Oral)  Resp 18  Wt 191 lb 9.6 oz (86.909 kg)  SpO2 99%  LMP   Physical Exam  Constitutional: She appears  well-developed and well-nourished. No distress.  HENT:  Head: Normocephalic and atraumatic.  Right Ear: Hearing, tympanic membrane, external ear and ear canal normal.  Left Ear: Hearing, tympanic membrane, external ear and ear canal normal.  Nose: Mucosal edema and rhinorrhea present. Right sinus exhibits maxillary sinus tenderness. Right sinus exhibits no frontal sinus tenderness. Left sinus exhibits maxillary sinus tenderness. Left sinus exhibits no frontal sinus tenderness.  Mouth/Throat: Uvula is midline, oropharynx is clear and moist and mucous membranes are normal. No oropharyngeal exudate, posterior oropharyngeal edema or posterior oropharyngeal erythema.  Eyes: Conjunctivae are normal.  Pupils are equal, round, and reactive to light. Right eye exhibits no discharge. Left eye exhibits no discharge. No scleral icterus.  Neck: Normal range of motion. Neck supple. No tracheal deviation present. No thyromegaly present.  Cardiovascular: Normal rate, regular rhythm and normal heart sounds.  Exam reveals no gallop and no friction rub.   No murmur heard. Pulmonary/Chest: Effort normal. No stridor. No respiratory distress. She has wheezes (exp; throughout). She has no rales.  Lymphadenopathy:    She has no cervical adenopathy.  Skin: Skin is warm and dry. She is not diaphoretic.  Vitals reviewed.      Assessment & Plan:     1. Bronchitis Worsening symptoms. Will give prednisone and augmentin as below. Hycodan cough syrup given for nighttime cough. Continue allergy medications. Stay well hydrated and get plenty of rest. Call if no improvement. Patient will call once she is better to schedule a f/u and have PFTs to make sure she does not need a daily inhaler. - predniSONE (DELTASONE) 10 MG tablet; Take 6 tabs PO on day 1&2, 5 tabs PO on day 3&4, 4 tabs PO on day 5&6, 3 tabs PO on day 7&8, 2 tabs PO on day 9&10, 1 tab PO on day 11&12.  Dispense: 42 tablet; Refill: 0 - amoxicillin-clavulanate (AUGMENTIN) 875-125 MG tablet; Take 1 tablet by mouth 2 (two) times daily.  Dispense: 20 tablet; Refill: 0  2. Cough See above medical treatment plan. - HYDROcodone-homatropine (HYCODAN) 5-1.5 MG/5ML syrup; Take 5 mLs by mouth every 8 (eight) hours as needed for cough.  Dispense: 180 mL; Refill: 0 '      Margaretann Loveless, PA-C  [facility]

## 2016-04-18 ENCOUNTER — Encounter: Payer: Self-pay | Admitting: Physician Assistant

## 2016-04-18 DIAGNOSIS — R059 Cough, unspecified: Secondary | ICD-10-CM

## 2016-04-18 DIAGNOSIS — R05 Cough: Secondary | ICD-10-CM

## 2016-04-18 MED ORDER — BENZONATATE 200 MG PO CAPS: 200.0000 mg | ORAL_CAPSULE | Freq: Three times a day (TID) | ORAL | Status: DC | PRN

## 2016-04-18 NOTE — Addendum Note (Signed)
Addended by: Margaretann LovelessBURNETTE, Watt Geiler M on: 04/18/2016 03:57 PM   Modules accepted: Orders

## 2016-05-10 ENCOUNTER — Other Ambulatory Visit: Payer: Self-pay | Admitting: Physician Assistant

## 2016-05-10 DIAGNOSIS — J302 Other seasonal allergic rhinitis: Secondary | ICD-10-CM

## 2016-06-07 ENCOUNTER — Other Ambulatory Visit: Payer: Self-pay | Admitting: Family Medicine

## 2016-06-07 DIAGNOSIS — G43019 Migraine without aura, intractable, without status migrainosus: Secondary | ICD-10-CM

## 2016-06-17 NOTE — Progress Notes (Signed)
Carrie [name] D.O. [facility] [address] [place], [place] [address] Phone: [phone]) [phone] Subjective:    I'm seeing this patient by the request  of:  Margaretann LovelessJennifer M Burnette, PA-C  CC: toe pain left foot  BJY:NWGNFAOZHYHPI:Subjective  Carrie KoyanagiLaura [name] is a 40 y.o. female coming in with complaint of toe pain. Left foot. Patient describes pain as a dull, throbbing aching sensation. Patient states during that weeks ago and was a possibility of an injury with a step toe. Patient had some swelling and minimal bruising initially. Continues to have the pain. Has had difficulty with her feet before. Patient rates the severity of pain as 4 out of 10 and seems to worsen with increasing activity. Patient states that walking in certain shoes make the differences. Has not tried many over-the-counter medications.     Past Medical History:  Diagnosis Date  . Abnormal Pap smear   . 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 Asthma    exercised induced  . Headache(784.0)   . PONV (postoperative nausea and vomiting)    Past Surgical History:  Procedure Laterality Date  . CESAREAN SECTION N/A 10/30/2013   Procedure: CESAREAN SECTION;  Surgeon: Meriel Picaichard M Holland, MD;  Location: [facility];  Service: Obstetrics;  Laterality: N/A;  PRIMARY  . COLPOSCOPY W/ BIOPSY / CURETTAGE    . DILATION AND CURETTAGE OF UTERUS    . FOOT FRACTURE SURGERY Left   . HAND SURGERY Left    Fracture repair  . IVF     Social History   Social History  . Marital status: Married    Spouse name: N/A  . Number of children: 1  . Years of education: Master's   Occupational History  . Part-time Insurance account managerteacher     [employer]   Social History Main Topics  . Smoking status: Never Smoker  . Smokeless tobacco: Never Used  . Alcohol use No  . Drug use: No  . Sexual activity: Not Currently     Comment: couple is afraid  to mess something up   Other Topics Concern  . Not on file   Social History Narrative  . No narrative on file   No Known  Allergies Family History  Problem Relation Age of Onset  . Arthritis Mother   . Diverticulitis Mother   . Thyroid disease Mother   . GER disease Mother   . Migraines Mother   . Arthritis Father   . Hypertension Father   . Hyperlipidemia Father   . Thyroid disease Father   . Diabetes Maternal Grandmother   . Heart attack Maternal Grandmother   . Alzheimer's disease Maternal Grandmother   . Heart disease Paternal Grandfather   . Heart attack Paternal Grandfather   . Anemia Sister   . Thyroid disease Sister   . Healthy Brother   . Emphysema Maternal Grandfather   . Breast cancer Paternal Grandmother   . Thyroid disease Sister   . Bipolar disorder Sister   . Arrhythmia Sister     Ventricular Tachycardia    Past medical history, social, surgical and family history all reviewed in electronic medical record.  No pertanent information unless stated regarding to the chief complaint.   Review of Systems: No headache, visual changes, nausea, vomiting, diarrhea, constipation, dizziness, abdominal pain, skin rash, fevers, chills, night sweats, weight loss, swollen lymph nodes, body aches, joint swelling, muscle aches, chest pain, shortness of breath, mood changes.   Objective  unknown if currently breastfeeding.  General: No apparent  distress alert and oriented x3 mood and affect normal, dressed appropriately.  HEENT: Pupils equal, extraocular movements intact  Respiratory: Patient's speak in full sentences and does not appear short of breath  Cardiovascular: No lower extremity edema, non tender, no erythema  Skin: Warm dry intact with no signs of infection or rash on extremities or on axial skeleton.  Abdomen: Soft nontender  Neuro: Cranial nerves II through XII are intact, neurovascularly intact in all extremities with 2+ DTRs and 2+ pulses.  Lymph: No lymphadenopathy of posterior or anterior cervical chain or axillae bilaterally.  Gait normal with good balance and coordination.  MSK:   Non tender with full range of motion and good stability and symmetric strength and tone of shoulders, elbows, wrist, hip, knee and ankles bilaterally.  Left Foot exam shows patient does have some breakdown of the transverse arch bilaterally. Patient does have a wide foot overall. Patient is severely tender to palpation over the second metatarsal. Negative squeeze test. Neurovascularly intact distally.  Limited musculoskeletal ultrasound was performed and interpreted by Judi Saa  Limited ultrasound shows the patient does have a cortical defect proximally on the second metatarsal. Patient has a very minimally displaced. Small soft callus formation noted. Reactive synovitis noted as well. Impression: Delayed healing of the second metatarsal   Impression and Recommendations:     This case required medical decision making of moderate complexity.      Note: This dictation was prepared with Dragon dictation along with smaller phrase technology. Any transcriptional errors that result from this process are unintentional.

## 2016-06-18 ENCOUNTER — Ambulatory Visit (INDEPENDENT_AMBULATORY_CARE_PROVIDER_SITE_OTHER): Payer: BC Managed Care – PPO | Admitting: Family Medicine

## 2016-06-18 ENCOUNTER — Encounter: Payer: Self-pay | Admitting: Family Medicine

## 2016-06-18 ENCOUNTER — Other Ambulatory Visit: Payer: Self-pay

## 2016-06-18 DIAGNOSIS — S92322A Displaced fracture of second metatarsal bone, left foot, initial encounter for closed fracture: Secondary | ICD-10-CM | POA: Diagnosis not present

## 2016-06-18 DIAGNOSIS — M79675 Pain in left toe(s): Secondary | ICD-10-CM

## 2016-06-18 NOTE — Assessment & Plan Note (Signed)
Patient is more of the second metatarsal fracture. We discussed icing regimen, patient was put in a postop boot. We discussed topical anti-inflammatories and vitamin D supplementation. Patient will avoid being barefoot. We'll come back and see me again in 2-3 weeks. As long as we see good callus formation we'll advance patient accordingly.

## 2016-06-18 NOTE — Patient Instructions (Addendum)
Great to finally meet you ! Sorry for the fashion wear Wear boot daily and avoid being barefoot.  Ice 20 minutes 2 times daily. Usually after activity and before bed. Vitamin D 4000 IU daily  We will need better shoes, Good shoes with rigid bottom.  Dierdre HarnessKeen, Dansko, Merrell or New balance greater then 700 after you are healed See me again in 2-3 weeks and make sure you are healing and the joint does not need an injection.

## 2016-07-03 NOTE — Progress Notes (Signed)
Carrie [name] D.O. [facility] [address] [place], [place] [address] Phone: [phone]) [phone] Subjective:    I'm seeing this patient by the request  of:  Margaretann LovelessJennifer M Burnette, PA-C  CC: toe pain left foot f/u  BJY:NWGNFAOZHYHPI:Subjective  Carrie KoyanagiLaura [name] is a 40 y.o. female coming in with complaint of toe pain. Left foot. Patient was found to have a cortical defect on the second metatarsal. Patient states that in fortunate she continues to have pain. Has been diligent wearing the postop. Patient states that it feels like her toes seems to be going over a little bit more frequently. Denies any numbness, tingling. Has not notice a/swelling issues having previously is the only improvement.     Past Medical History:  Diagnosis Date  . Abnormal Pap smear   . 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 Asthma    exercised induced  . Headache(784.0)   . PONV (postoperative nausea and vomiting)    Past Surgical History:  Procedure Laterality Date  . CESAREAN SECTION N/A 10/30/2013   Procedure: CESAREAN SECTION;  Surgeon: Meriel Picaichard M Holland, MD;  Location: [facility];  Service: Obstetrics;  Laterality: N/A;  PRIMARY  . COLPOSCOPY W/ BIOPSY / CURETTAGE    . DILATION AND CURETTAGE OF UTERUS    . FOOT FRACTURE SURGERY Left   . HAND SURGERY Left    Fracture repair  . IVF     Social History   Social History  . Marital status: Married    Spouse name: N/A  . Number of children: 1  . Years of education: Master's   Occupational History  . Part-time Insurance account managerteacher     [employer]   Social History Main Topics  . Smoking status: Never Smoker  . Smokeless tobacco: Never Used  . Alcohol use No  . Drug use: No  . Sexual activity: Not Currently     Comment: couple is afraid  to mess something up   Other Topics Concern  . None   Social History Narrative  . None   No Known Allergies Family History  Problem Relation Age of Onset  . Arthritis Mother   . Diverticulitis Mother   . Thyroid disease Mother   . GER  disease Mother   . Migraines Mother   . Arthritis Father   . Hypertension Father   . Hyperlipidemia Father   . Thyroid disease Father   . Diabetes Maternal Grandmother   . Heart attack Maternal Grandmother   . Alzheimer's disease Maternal Grandmother   . Heart disease Paternal Grandfather   . Heart attack Paternal Grandfather   . Anemia Sister   . Thyroid disease Sister   . Healthy Brother   . Emphysema Maternal Grandfather   . Breast cancer Paternal Grandmother   . Thyroid disease Sister   . Bipolar disorder Sister   . Arrhythmia Sister     Ventricular Tachycardia    Past medical history, social, surgical and family history all reviewed in electronic medical record.  No pertanent information unless stated regarding to the chief complaint.   Review of Systems: No headache, visual changes, nausea, vomiting, diarrhea, constipation, dizziness, abdominal pain, skin rash, fevers, chills, night sweats, weight loss, swollen lymph nodes, body aches, joint swelling, muscle aches, chest pain, shortness of breath, mood changes.   Objective  unknown if currently breastfeeding.  General: No apparent distress alert and oriented x3 mood and affect normal, dressed appropriately.  HEENT: Pupils equal, extraocular movements intact  Respiratory: Patient's speak in  full sentences and does not appear short of breath  Cardiovascular: No lower extremity edema, non tender, no erythema  Skin: Warm dry intact with no signs of infection or rash on extremities or on axial skeleton.  Abdomen: Soft nontender  Neuro: Cranial nerves II through XII are intact, neurovascularly intact in all extremities with 2+ DTRs and 2+ pulses.  Lymph: No lymphadenopathy of posterior or anterior cervical chain or axillae bilaterally.  Gait normal with good balance and coordination.  MSK:  Non tender with full range of motion and good stability and symmetric strength and tone of shoulders, elbows, wrist, hip, knee and ankles  bilaterally.  Left Foot exam shows patient does have some breakdown of the transverse arch bilaterally. Patient does have a wide foot overall. Patient is severely tender to palpation over the second metatarsal. Negative squeeze test. Neurovascularly intact distally. No significant change from previous exam.  Limited musculoskeletal ultrasound was performed and interpreted by Judi SaaZachary M [name]  Limited ultrasound shows the patient does have a cortical defect proximally on the second metatarsal. Increased synovitis as well. Impression: Continued delayed healing of the second metatarsal   Impression and Recommendations:     This case required medical decision making of moderate complexity.      Note: This dictation was prepared with Dragon dictation along with smaller phrase technology. Any transcriptional errors that result from this process are unintentional.

## 2016-07-04 ENCOUNTER — Ambulatory Visit (INDEPENDENT_AMBULATORY_CARE_PROVIDER_SITE_OTHER): Payer: BC Managed Care – PPO | Admitting: Family Medicine

## 2016-07-04 ENCOUNTER — Encounter: Payer: Self-pay | Admitting: Family Medicine

## 2016-07-04 ENCOUNTER — Other Ambulatory Visit: Payer: Self-pay

## 2016-07-04 DIAGNOSIS — S92322D Displaced fracture of second metatarsal bone, left foot, subsequent encounter for fracture with routine healing: Secondary | ICD-10-CM

## 2016-07-04 MED ORDER — ALENDRONATE SODIUM 70 MG PO TABS: 70.0000 mg | ORAL_TABLET | ORAL | 1 refills | Status: DC

## 2016-07-04 NOTE — Patient Instructions (Signed)
Good to see you  I am sorry not better Have greg look up Freiberg's disease.  Continue the vitamin D Fosamax once weekly for next 4 weeks.  Wear boot daily  Spacer between toes.  Consider turmeric 500mg  twice daily  See me again in 3-4 weeks.

## 2016-07-04 NOTE — Assessment & Plan Note (Signed)
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 We discussed possible advance imaging the patient declined at this time. We discussed that there sometimes association with rheumatoid logical diseases. We do not feel that further workup is necessary at this time. Patient was put in a Cam Walker for now. Put on Fosamax for potential healing properties. Patient and will follow-up again in 3-4 weeks.

## 2016-07-31 ENCOUNTER — Ambulatory Visit (INDEPENDENT_AMBULATORY_CARE_PROVIDER_SITE_OTHER): Payer: BC Managed Care – PPO | Admitting: Internal Medicine

## 2016-07-31 ENCOUNTER — Encounter: Payer: Self-pay | Admitting: Internal Medicine

## 2016-07-31 VITALS — BP 118/68 | HR 76 | Temp 98.4°F | Ht 63.0 in | Wt 194.6 lb

## 2016-07-31 DIAGNOSIS — S92322D Displaced fracture of second metatarsal bone, left foot, subsequent encounter for fracture with routine healing: Secondary | ICD-10-CM

## 2016-07-31 DIAGNOSIS — K219 Gastro-esophageal reflux disease without esophagitis: Secondary | ICD-10-CM

## 2016-07-31 DIAGNOSIS — G43829 Menstrual migraine, not intractable, without status migrainosus: Secondary | ICD-10-CM | POA: Diagnosis not present

## 2016-07-31 DIAGNOSIS — Z8781 Personal history of (healed) traumatic fracture: Secondary | ICD-10-CM

## 2016-07-31 DIAGNOSIS — Z23 Encounter for immunization: Secondary | ICD-10-CM | POA: Diagnosis not present

## 2016-07-31 DIAGNOSIS — J4520 Mild intermittent asthma, uncomplicated: Secondary | ICD-10-CM

## 2016-07-31 DIAGNOSIS — J0140 Acute pansinusitis, unspecified: Secondary | ICD-10-CM | POA: Diagnosis not present

## 2016-07-31 DIAGNOSIS — E7800 Pure hypercholesterolemia, unspecified: Secondary | ICD-10-CM | POA: Diagnosis not present

## 2016-07-31 DIAGNOSIS — N943 Premenstrual tension syndrome: Secondary | ICD-10-CM

## 2016-07-31 MED ORDER — MAGNESIUM OXIDE 400 MG PO CAPS: 400.0000 mg | ORAL_CAPSULE | Freq: Every day | ORAL | 1 refills | Status: DC

## 2016-07-31 MED ORDER — SCOPOLAMINE 1 MG/3DAYS TD PT72: 1.0000 | MEDICATED_PATCH | TRANSDERMAL | 0 refills | Status: DC

## 2016-07-31 MED ORDER — FLUTICASONE PROPIONATE 50 MCG/ACT NA SUSP
2.0000 | Freq: Every day | NASAL | 6 refills | Status: DC
Start: 2016-07-31 — End: 2017-04-14

## 2016-07-31 NOTE — Progress Notes (Signed)
[name] [address] [place], [place] [address] Phone: [phone] Subjective:    I'm seeing this patient by the request  of:  Dale [place], MD  CC: toe pain left foot f/u  BJY:NWGNFAOZHY  Carrie [name] is a 40 y.o. female coming in with complaint of toe pain. Left foot. Patient was found to have a cortical defect on the second metatarsal. In Cam Walker. Patient was given once weekly vitamin D as well as the Fosamax. States that the pain has improved. Has not had any side effects of medication.     Past Medical History:  Diagnosis Date  . Abnormal Pap smear   . Allergy   . 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 Asthma    exercised induced  . Depression   . Headache(784.0)   . Mononucleosis 1998  . PONV (postoperative nausea and vomiting)    Past Surgical History:  Procedure Laterality Date  . CARPAL TUNNEL RELEASE Bilateral 2016  . CESAREAN SECTION N/A 10/30/2013   Procedure: CESAREAN SECTION;  Surgeon: Meriel Pica, MD;  Location: [facility];  Service: Obstetrics;  Laterality: N/A;  PRIMARY  . COLPOSCOPY W/ BIOPSY / CURETTAGE    . DE QUERVAIN'S RELEASE Left 2016  . DILATION AND CURETTAGE OF UTERUS    . FOOT FRACTURE SURGERY Left   . HAND SURGERY Left    Fracture repair  . IVF     Social History   Social History  . Marital status: Married    Spouse name: N/A  . Number of children: 1  . Years of education: Master's   Occupational History  . Part-time Insurance account manager   Social History Main Topics  . Smoking status: Never Smoker  . Smokeless tobacco: Never Used  . Alcohol use No  . Drug use: No  . Sexual activity: Not Currently     Comment: couple is afraid  to mess something up   Other Topics Concern  . Not on file   Social History Narrative  . No narrative on file   No Known Allergies Family History  Problem Relation Age of Onset  . Arthritis Mother   . Diverticulitis Mother   . Thyroid disease Mother   . GER  disease Mother   . Migraines Mother   . Arthritis Father   . Hypertension Father   . Hyperlipidemia Father   . Thyroid disease Father   . Diabetes Maternal Grandmother   . Heart attack Maternal Grandmother   . Alzheimer's disease Maternal Grandmother   . Heart disease Paternal Grandfather   . Heart attack Paternal Grandfather   . Anemia Sister   . Thyroid disease Sister   . Healthy Brother   . Emphysema Maternal Grandfather   . Breast cancer Paternal Grandmother   . Thyroid disease Sister   . Bipolar disorder Sister   . Arrhythmia Sister     Ventricular Tachycardia    Past medical history, social, surgical and family history all reviewed in electronic medical record.  No pertanent information unless stated regarding to the chief complaint.   Review of Systems: No headache, visual changes, nausea, vomiting, diarrhea, constipation, dizziness, abdominal pain, skin rash, fevers, chills, night sweats, weight loss, swollen lymph nodes, body aches, joint swelling, muscle aches, chest pain, shortness of breath, mood changes.   Objective  unknown if currently breastfeeding.  General: No apparent distress alert and oriented x3 mood and affect normal, dressed appropriately.  HEENT:  Pupils equal, extraocular movements intact  Respiratory: Patient's speak in full sentences and does not appear short of breath  Cardiovascular: No lower extremity edema, non tender, no erythema  Skin: Warm dry intact with no signs of infection or rash on extremities or on axial skeleton.  Abdomen: Soft nontender  Neuro: Cranial nerves II through XII are intact, neurovascularly intact in all extremities with 2+ DTRs and 2+ pulses.  Lymph: No lymphadenopathy of posterior or anterior cervical chain or axillae bilaterally.  Gait normal with good balance and coordination.  MSK:  Non tender with full range of motion and good stability and symmetric strength and tone of shoulders, elbows, wrist, hip, knee and ankles  bilaterally.  Left Foot exam shows patient does have some breakdown of the transverse arch bilaterally. Patient does have a wide foot overall. Minimal tenderness over the second metatarsal.  Limited musculoskeletal ultrasound was performed and interpreted by Judi SaaZachary M Mahati Vajda  Limited ultrasound shows significant healing from the callus formation is artery having reabsorption. Patient continues to have a mild synovitis. Impression: Significant reabsorption of the bone showing healing but continued synovitis   Impression and Recommendations:     This case required medical decision making of moderate complexity.      Note: This dictation was prepared with Dragon dictation along with smaller phrase technology. Any transcriptional errors that result from this process are unintentional.

## 2016-07-31 NOTE — Progress Notes (Signed)
Patient ID: Carrie [name], female   DOB: 11/02/1976, 40 y.o.   MRN: [id number]   Subjective:    Patient ID: Carrie KoyanagiLaura [name], female    DOB: 11/06/1976, 40 y.o.   MRN: [id number]  HPI  Patient here to establish care.  She has a history of migraine headaches.  Describes her headaches as frontal pressure with some stabbing pain.  Will see visual spots.  imitrex does not work.  Takes motrin 800mg .  The headaches are stronger during her period.  She started having headaches when she was 40 years old.  Evaluated and diagnosed with chronic daily headaches.  [facility] evaluated [facility].  Saw Dr Neale BurlyFreeman.  Discussed botox.  Did not want to proceed.  Goes to [facility].  Up to date.  On topamax.  Concerned causing short term memory loss.  Tries to stay active.  Has been having pain in her left foot.  Seeing Dr Katrinka BlazingSmith.  Wearing pot op shoe initially and then in a boot.  Has f/u tomorrow.  Also has a history of T12 compression fracture.   Breathing stable.  No nausea or vomiting.  Bowels stable.     Past Medical History:  Diagnosis Date  . Abnormal Pap smear   . Allergy   . 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 Asthma    exercised induced  . Depression   . Headache(784.0)   . Mononucleosis 1998  . PONV (postoperative nausea and vomiting)    Past Surgical History:  Procedure Laterality Date  . CARPAL TUNNEL RELEASE Bilateral 2016  . CESAREAN SECTION N/A 10/30/2013   Procedure: CESAREAN SECTION;  Surgeon: Meriel Picaichard M Holland, MD;  Location: [facility];  Service: Obstetrics;  Laterality: N/A;  PRIMARY  . COLPOSCOPY W/ BIOPSY / CURETTAGE    . DE QUERVAIN'S RELEASE Left 2016  . DILATION AND CURETTAGE OF UTERUS    . FOOT FRACTURE SURGERY Left   . HAND SURGERY Left    Fracture repair  . IVF     Family History  Problem Relation Age of Onset  . Arthritis Mother   . Diverticulitis Mother   . Thyroid disease Mother   . GER disease Mother   . Migraines Mother   . Arthritis Father   . Hypertension  Father   . Hyperlipidemia Father   . Thyroid disease Father   . Diabetes Maternal Grandmother   . Heart attack Maternal Grandmother   . Alzheimer's disease Maternal Grandmother   . Heart disease Paternal Grandfather   . Heart attack Paternal Grandfather   . Anemia Sister   . Thyroid disease Sister   . Healthy Brother   . Emphysema Maternal Grandfather   . Breast cancer Paternal Grandmother   . Thyroid disease Sister   . Bipolar disorder Sister   . Arrhythmia Sister     Ventricular Tachycardia   Social History   Social History  . Marital status: Married    Spouse name: N/A  . Number of children: 1  . Years of education: Master's   Occupational History  . Part-time Insurance account managerteacher     [employer]   Social History Main Topics  . Smoking status: Never Smoker  . Smokeless tobacco: Never Used  . Alcohol use No  . Drug use: No  . Sexual activity: Not Currently     Comment: couple is afraid  to mess something up   Other Topics Concern  . None   Social History Narrative  . None  Outpatient Encounter Prescriptions as of 07/31/2016  Medication Sig  . acetaminophen (TYLENOL) 500 MG tablet Take 1,000 mg by mouth every 6 (six) hours as needed for pain.  Marland Kitchen albuterol (PROAIR HFA) 108 (90 BASE) MCG/ACT inhaler Inhale into the lungs.  Marland Kitchen buPROPion (WELLBUTRIN XL) 150 MG 24 hr tablet   . fluticasone (FLONASE) 50 MCG/ACT nasal spray Place 2 sprays into both nostrils daily.  Marland Kitchen ibuprofen (ADVIL,MOTRIN) 800 MG tablet Take 1 tablet (800 mg total) by mouth every 8 (eight) hours as needed for moderate pain.  Marland Kitchen lansoprazole (PREVACID) 15 MG capsule Take by mouth.  . loratadine (CLARITIN) 10 MG tablet Take 10 mg by mouth daily.  . montelukast (SINGULAIR) 10 MG tablet TAKE 1 TABLET (10 MG TOTAL) BY MOUTH AT BEDTIME.  . SUMAtriptan (IMITREX) 50 MG tablet Take 1 tablet (50 mg total) by mouth every 2 (two) hours as needed for migraine. May repeat in 2 hours if headache persists or recurs.  .  topiramate (TOPAMAX) 25 MG tablet TAKE 2 TABLETS BY MOUTH TWICE A DAY  . Vitamin D, Ergocalciferol, (DRISDOL) 50000 units CAPS capsule TAKE 1 CAPSULE WEEKLY.  . [DISCONTINUED] alendronate (FOSAMAX) 70 MG tablet Take 1 tablet (70 mg total) by mouth every 7 (seven) days. Take with a full glass of water on an empty stomach.  . [DISCONTINUED] amoxicillin-clavulanate (AUGMENTIN) 875-125 MG tablet Take 1 tablet by mouth 2 (two) times daily.  . [DISCONTINUED] benzonatate (TESSALON) 200 MG capsule Take 1 capsule (200 mg total) by mouth 3 (three) times daily as needed for cough.  . [DISCONTINUED] fluticasone (FLONASE) 50 MCG/ACT nasal spray Place 2 sprays into both nostrils daily.  . [DISCONTINUED] HYDROcodone-homatropine (HYCODAN) 5-1.5 MG/5ML syrup Take 5 mLs by mouth every 8 (eight) hours as needed for cough.  . Magnesium Oxide 400 MG CAPS Take 1 capsule (400 mg total) by mouth daily.  Marland Kitchen scopolamine (TRANSDERM-SCOP, 1.5 MG,) 1 MG/3DAYS Place 1 patch (1.5 mg total) onto the skin every 3 (three) days.   No facility-administered encounter medications on file as of 07/31/2016.     Review of Systems  Constitutional: Negative for appetite change and unexpected weight change.  HENT: Negative for congestion and sinus pressure.   Respiratory: Negative for cough, chest tightness and shortness of breath.   Cardiovascular: Negative for chest pain, palpitations and leg swelling.  Gastrointestinal: Negative for abdominal pain, diarrhea, nausea and vomiting.  Genitourinary: Negative for difficulty urinating and dysuria.  Musculoskeletal:       H/o T12 compression fracture.  Persistent left foot pain.    Skin: Negative for color change and rash.  Neurological: Positive for headaches. Negative for dizziness.  Psychiatric/Behavioral: Negative for agitation and dysphoric mood.       Objective:    Physical Exam  Constitutional: She appears well-developed and well-nourished. No distress.  HENT:  Nose: Nose  normal.  Mouth/Throat: Oropharynx is clear and moist.  Neck: Neck supple. No thyromegaly present.  Cardiovascular: Normal rate and regular rhythm.   Pulmonary/Chest: Breath sounds normal. No respiratory distress. She has no wheezes.  Abdominal: Soft. Bowel sounds are normal. There is no tenderness.  Musculoskeletal: She exhibits no edema or tenderness.  Lymphadenopathy:    She has no cervical adenopathy.  Skin: No rash noted. No erythema.  Psychiatric: She has a normal mood and affect. Her behavior is normal.    BP 118/68   Pulse 76   Temp 98.4 F (36.9 C) (Oral)   Ht 5\' 3"  (1.6 m)   Wt  194 lb 9.6 oz (88.3 kg)   SpO2 98%   BMI 34.47 kg/m  Wt Readings from Last 3 Encounters:  07/31/16 194 lb 9.6 oz (88.3 kg)  04/16/16 191 lb 9.6 oz (86.9 kg)  01/22/16 194 lb (88 kg)     Lab Results  Component Value Date   WBC 11.4 (H) 11/02/2013   HGB 10.6 (L) 11/02/2013   HCT 31.1 (L) 11/02/2013   PLT 354 11/02/2013   GLUCOSE 105 (H) 11/02/2013   ALT 13 11/02/2013   AST 19 11/02/2013   NA 136 11/02/2013   K 3.9 11/02/2013   CL 103 11/02/2013   CREATININE 0.61 11/02/2013   BUN 8 11/02/2013   CO2 22 11/02/2013        Assessment & Plan:   Problem List Items Addressed This Visit    Acid reflux    On prevacid.  Follow.  Upper symptoms controlled.        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  Stable.  Follow.       Closed displaced fracture of second metatarsal bone of left foot    Seeing Dr Katrinka Blazing.  On fosamax.  Taking vitamin D.  Discussed obtaining bone density.        Relevant Orders   DG Bone Density   Headache, menstrual migraine    Taking ibuprofen.  Has been worked by neurology.        Hypercholesteremia    Low cholesterol diet and exercise.  Follow lipid panel.         Other Visit Diagnoses    History of compression fracture of  spine    -  Primary   Relevant Orders   DG Bone Density   Encounter for immunization       Relevant Medications   fluticasone (FLONASE) 50 MCG/ACT nasal spray   scopolamine (TRANSDERM-SCOP, 1.5 MG,) 1 MG/3DAYS   Magnesium Oxide 400 MG CAPS   Other Relevant Orders   Flu Vaccine QUAD 36+ mos IM (Completed)   Acute pansinusitis, recurrence not specified       Relevant Medications   fluticasone (FLONASE) 50 MCG/ACT nasal spray       Dale [place], MD

## 2016-07-31 NOTE — Progress Notes (Signed)
Pre visit review using our clinic review tool, if applicable. No additional management support is needed unless otherwise documented below in the visit note. 

## 2016-08-01 ENCOUNTER — Encounter: Payer: Self-pay | Admitting: Family Medicine

## 2016-08-01 ENCOUNTER — Ambulatory Visit (INDEPENDENT_AMBULATORY_CARE_PROVIDER_SITE_OTHER): Payer: BC Managed Care – PPO | Admitting: Family Medicine

## 2016-08-01 DIAGNOSIS — S92322D Displaced fracture of second metatarsal bone, left foot, subsequent encounter for fracture with routine healing: Secondary | ICD-10-CM

## 2016-08-01 MED ORDER — ALENDRONATE SODIUM 70 MG PO TABS: 70.0000 mg | ORAL_TABLET | ORAL | 1 refills | Status: DC

## 2016-08-01 NOTE — Patient Instructions (Signed)
Good to see you  You are doing great! Ice still is your friend Wear boot out of the house Switch to postop boot when on the cruise.  Rigid sole shoe in the house styarting this weekend.  See me after the fun cruise and if still pain we will inject the joint!

## 2016-08-01 NOTE — Assessment & Plan Note (Signed)
Healed at moment, mild synovitis no sign of AVN at this time.  Will have chronic subluxation. Patient will start transitioning out of the Cam Walker back to the postop boot. We discussed icing regimen. 4 more weeks of the Fosamax. Follow-up at that time. Patient has healed at that time we will do well. If worsening synovitis to injection

## 2016-08-02 ENCOUNTER — Telehealth: Payer: Self-pay | Admitting: *Deleted

## 2016-08-02 DIAGNOSIS — G43829 Menstrual migraine, not intractable, without status migrainosus: Secondary | ICD-10-CM

## 2016-08-02 DIAGNOSIS — E7800 Pure hypercholesterolemia, unspecified: Secondary | ICD-10-CM

## 2016-08-02 NOTE — Telephone Encounter (Signed)
Pt coming in for labs on Thursday. Need lab orders placed. 

## 2016-08-03 NOTE — Telephone Encounter (Signed)
Orders placed for labs

## 2016-08-04 ENCOUNTER — Encounter: Payer: Self-pay | Admitting: Internal Medicine

## 2016-08-04 NOTE — Assessment & Plan Note (Signed)
Low cholesterol diet and exercise.  Follow lipid panel.   

## 2016-08-04 NOTE — Assessment & Plan Note (Signed)
On prevacid.  Follow.  Upper symptoms controlled.

## 2016-08-04 NOTE — Assessment & Plan Note (Signed)
Seeing Dr Katrinka BlazingSmith.  On fosamax.  Taking vitamin D.  Discussed obtaining bone density.

## 2016-08-04 NOTE — Assessment & Plan Note (Signed)
Taking ibuprofen.  Has been worked by neurology.

## 2016-08-04 NOTE — Assessment & Plan Note (Signed)
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  Stable.  Follow.

## 2016-08-08 ENCOUNTER — Other Ambulatory Visit (INDEPENDENT_AMBULATORY_CARE_PROVIDER_SITE_OTHER): Payer: BC Managed Care – PPO

## 2016-08-08 ENCOUNTER — Telehealth: Payer: Self-pay | Admitting: Internal Medicine

## 2016-08-08 DIAGNOSIS — G43829 Menstrual migraine, not intractable, without status migrainosus: Secondary | ICD-10-CM

## 2016-08-08 DIAGNOSIS — N943 Premenstrual tension syndrome: Secondary | ICD-10-CM

## 2016-08-08 DIAGNOSIS — E7800 Pure hypercholesterolemia, unspecified: Secondary | ICD-10-CM

## 2016-08-08 LAB — LIPID PANEL
CHOL/HDL RATIO: 4
Cholesterol: 196 mg/dL (ref 0–200)
HDL: 44.6 mg/dL (ref >39.00)
LDL CALC: 122 mg/dL — AB (ref 0–99)
NonHDL: 150.93
TRIGLYCERIDES: 143 mg/dL (ref 0.0–149.0)
VLDL: 28.6 mg/dL (ref 0.0–40.0)

## 2016-08-08 LAB — CBC WITH DIFFERENTIAL/PLATELET
BASOS ABS: 0 10*3/uL (ref 0.0–0.1)
Basophils Relative: 0.4 % (ref 0.0–3.0)
Eosinophils Absolute: 0.1 10*3/uL (ref 0.0–0.7)
Eosinophils Relative: 1.7 % (ref 0.0–5.0)
HEMATOCRIT: 36.7 % (ref 36.0–46.0)
Hemoglobin: 12.5 g/dL (ref 12.0–15.0)
LYMPHS PCT: 39.1 % (ref 12.0–46.0)
Lymphs Abs: 2.2 10*3/uL (ref 0.7–4.0)
MCHC: 34 g/dL (ref 30.0–36.0)
MCV: 87.1 fl (ref 78.0–100.0)
MONOS PCT: 5.4 % (ref 3.0–12.0)
Monocytes Absolute: 0.3 10*3/uL (ref 0.1–1.0)
NEUTROS ABS: 3 10*3/uL (ref 1.4–7.7)
Neutrophils Relative %: 53.4 % (ref 43.0–77.0)
PLATELETS: 317 10*3/uL (ref 150.0–400.0)
RBC: 4.22 Mil/uL (ref 3.87–5.11)
RDW: 13.4 % (ref 11.5–15.5)
WBC: 5.6 10*3/uL (ref 4.0–10.5)

## 2016-08-08 LAB — HEPATIC FUNCTION PANEL
ALBUMIN: 4.2 g/dL (ref 3.5–5.2)
ALT: 12 U/L (ref 0–35)
AST: 15 U/L (ref 0–37)
Alkaline Phosphatase: 48 U/L (ref 39–117)
BILIRUBIN DIRECT: 0 mg/dL (ref 0.0–0.3)
TOTAL PROTEIN: 7.3 g/dL (ref 6.0–8.3)
Total Bilirubin: 0.4 mg/dL (ref 0.2–1.2)

## 2016-08-08 LAB — BASIC METABOLIC PANEL
BUN: 13 mg/dL (ref 6–23)
CHLORIDE: 110 meq/L (ref 96–112)
CO2: 21 mEq/L (ref 19–32)
Calcium: 8.7 mg/dL (ref 8.4–10.5)
Creatinine, Ser: 0.71 mg/dL (ref 0.40–1.20)
GFR: 97.12 mL/min (ref >60.00)
Glucose, Bld: 88 mg/dL (ref 70–99)
POTASSIUM: 3.6 meq/L (ref 3.5–5.1)
SODIUM: 139 meq/L (ref 135–145)

## 2016-08-08 LAB — TSH: TSH: 1.87 u[IU]/mL (ref 0.35–4.50)

## 2016-08-08 NOTE — Telephone Encounter (Signed)
LV stating to check mychart for message.

## 2016-08-08 NOTE — Telephone Encounter (Signed)
Pt is waiting to her from our office about getting a bone density test done. She has one concern. She is currently taking alendronate (FOSAMAX) 70 MG tablet. Will this affect the bone density test. She is suppose to take fosamax for another 4 weeks. Please advise pt.  Thanks

## 2016-08-08 NOTE — Telephone Encounter (Signed)
Would this affect her results? Talked with referral coordinator test has not been scheduled.

## 2016-08-08 NOTE — Telephone Encounter (Signed)
I want her to go ahead and get the bone density.  The fosamax will not interfere with the test.

## 2016-08-09 ENCOUNTER — Encounter: Payer: Self-pay | Admitting: Internal Medicine

## 2016-09-08 NOTE — Progress Notes (Signed)
[facility] Scale[facility] Jonnie Truxillo D.O. [facility] [address] [place], [place] [address] Phone: [phone] Subjective:     CC: toe pain left foot f/u  BJY:NWGNFAOZHYHPI:Subjective  Carrie [name] is a 40 y.o. female coming in with complaint of toe pain. Left foot. Patient was found to have a cortical defect on the second metatarsal. In Cam Walker. Patient was given once weekly vitamin D as well as the Fosamax. States that the pain has improved. Has not had any side effects of medication.Patient states Unfortunately she is not making any significant improvement. Continues to have swelling. Denies any new symptoms is worsening of previous symptoms. Feels that something toe continues to worsen with the overlapping to the large toe. Feels that she has not made any improvement since last exam.         Past Medical History:  Diagnosis Date  . Abnormal Pap smear   . Allergy   . 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 Asthma    exercised induced  . Depression   . Headache(784.0)   . Mononucleosis 1998  . PONV (postoperative nausea and vomiting)         Past Surgical History:  Procedure Laterality Date  . CARPAL TUNNEL RELEASE Bilateral 2016  . CESAREAN SECTION N/A 10/30/2013   Procedure: CESAREAN SECTION;  Surgeon: Meriel Picaichard M Holland, MD;  Location: [facility];  Service: Obstetrics;  Laterality: N/A;  PRIMARY  . COLPOSCOPY W/ BIOPSY / CURETTAGE    . DE QUERVAIN'S RELEASE Left 2016  . DILATION AND CURETTAGE OF UTERUS    . FOOT FRACTURE SURGERY Left   . HAND SURGERY Left    Fracture repair  . IVF     Social History        Social History  . Marital status: Married    Spouse name: N/A  . Number of children: 1  . Years of education: Master's        Occupational History  . Part-time Insurance account managerteacher     [employer]         Social History Main Topics  . Smoking status: Never Smoker  . Smokeless tobacco: Never Used  . Alcohol use No  . Drug use: No  . Sexual activity: Not Currently       Comment: couple is afraid  to mess something up       Other Topics Concern  . Not on file      Social History Narrative  . No narrative on file   No Known Allergies       Family History  Problem Relation Age of Onset  . Arthritis Mother   . Diverticulitis Mother   . Thyroid disease Mother   . GER disease Mother   . Migraines Mother   . Arthritis Father   . Hypertension Father   . Hyperlipidemia Father   . Thyroid disease Father   . Diabetes Maternal Grandmother   . Heart attack Maternal Grandmother   . Alzheimer's disease Maternal Grandmother   . Heart disease Paternal Grandfather   . Heart attack Paternal Grandfather   . Anemia Sister   . Thyroid disease Sister   . Healthy Brother   . Emphysema Maternal Grandfather   . Breast cancer Paternal Grandmother   . Thyroid disease Sister   . Bipolar disorder Sister   . Arrhythmia Sister     Ventricular Tachycardia    Past medical history, social, surgical and family history all reviewed in electronic medical record.  No pertanent  information unless stated regarding to the chief complaint.   Review of Systems: No headache, visual changes, nausea, vomiting, diarrhea, constipation, dizziness, abdominal pain, skin rash, fevers, chills, night sweats, weight loss, swollen lymph nodes, body aches, joint swelling, muscle aches, chest pain, shortness of breath, mood changes.   Objective  unknown if currently breastfeeding. Blood pressure 120/80, pulse 94, height 5\' 3"  (1.6 m), weight 194 lb (88 kg), last menstrual period 09/05/2016, SpO2 99 %, unknown if currently breastfeeding.  Patient's physical exam has been further evaluated and updated and is detailed for exam on 09/09/16   General: No apparent distress alert and oriented x3 mood and affect normal, dressed appropriately.  HEENT: Pupils equal, extraocular movements intact  Respiratory: Patient's speak in full sentences and does not appear  short of breath  Cardiovascular: No lower extremity edema, non tender, no erythema  Skin: Warm dry intact with no signs of infection or rash on extremities or on axial skeleton.  Abdomen: Soft nontender  Neuro: Cranial nerves II through XII are intact, neurovascularly intact in all extremities with 2+ DTRs and 2+ pulses.  Lymph: No lymphadenopathy of posterior or anterior cervical chain or axillae bilaterally.  Gait normal with good balance and coordination.  MSK:  Non tender with full range of motion and good stability and symmetric strength and tone of shoulders, elbows, wrist, hip, knee and ankles bilaterally.  Left Foot exam shows patient does have some breakdown of the transverse arch bilaterally. Patient does have a wide foot overall. Moderate tenderness over the second metatarsal. Significant overlapping of the second and first toes.  Limited musculoskeletal ultrasound was performed and interpreted by Judi SaaZachary M Aspen Deterding  Limited ultrasound shows patient continues to have a synovitis but otherwise no bony normality noted of the second metatarsal.

## 2016-09-09 ENCOUNTER — Encounter: Payer: Self-pay | Admitting: Family Medicine

## 2016-09-09 ENCOUNTER — Ambulatory Visit: Payer: Self-pay

## 2016-09-09 ENCOUNTER — Ambulatory Visit
Admission: RE | Admit: 2016-09-09 | Discharge: 2016-09-09 | Disposition: A | Discharge status: Home / Self Care | Payer: BC Managed Care – PPO | Source: Ambulatory Visit | Attending: Internal Medicine | Admitting: Internal Medicine

## 2016-09-09 ENCOUNTER — Ambulatory Visit (INDEPENDENT_AMBULATORY_CARE_PROVIDER_SITE_OTHER): Payer: BC Managed Care – PPO | Admitting: Family Medicine

## 2016-09-09 VITALS — BP 120/80 | HR 94 | Ht 63.0 in | Wt 194.0 lb

## 2016-09-09 DIAGNOSIS — S92322D Displaced fracture of second metatarsal bone, left foot, subsequent encounter for fracture with routine healing: Secondary | ICD-10-CM | POA: Insufficient documentation

## 2016-09-09 DIAGNOSIS — Z8781 Personal history of (healed) traumatic fracture: Secondary | ICD-10-CM

## 2016-09-09 DIAGNOSIS — S92322G Displaced fracture of second metatarsal bone, left foot, subsequent encounter for fracture with delayed healing: Secondary | ICD-10-CM | POA: Diagnosis not present

## 2016-09-09 DIAGNOSIS — X58XXXD Exposure to other specified factors, subsequent encounter: Secondary | ICD-10-CM | POA: Diagnosis not present

## 2016-09-09 NOTE — Assessment & Plan Note (Signed)
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 Patient has been on the vitamin D and Fosamax. Seems to be doing okay. Patient seems to have healed but unfortunately continues tenosynovitis. We did discuss possibly injecting which patient declined. At this point we will refer patient to podiatry for further evaluation. I do believe that patient will likely need pinning to keep from the overlapping. Just do not know how much that this will help the pain. May need custom orthotics in the long run.  Patient in follow-up with me as needed

## 2016-09-09 NOTE — Addendum Note (Signed)
Addended by: Edwena FeltyARSON, LINDSAY T on: 09/09/2016 02:33 PM   Modules accepted: Orders

## 2016-09-11 ENCOUNTER — Encounter: Payer: Self-pay | Admitting: Internal Medicine

## 2016-09-25 ENCOUNTER — Ambulatory Visit (INDEPENDENT_AMBULATORY_CARE_PROVIDER_SITE_OTHER): Payer: BC Managed Care – PPO

## 2016-09-25 ENCOUNTER — Ambulatory Visit (INDEPENDENT_AMBULATORY_CARE_PROVIDER_SITE_OTHER): Payer: BC Managed Care – PPO | Admitting: Podiatry

## 2016-09-25 DIAGNOSIS — M79675 Pain in left toe(s): Secondary | ICD-10-CM

## 2016-09-25 DIAGNOSIS — M216X9 Other acquired deformities of unspecified foot: Secondary | ICD-10-CM | POA: Diagnosis not present

## 2016-09-25 DIAGNOSIS — M79674 Pain in right toe(s): Secondary | ICD-10-CM | POA: Diagnosis not present

## 2016-09-25 DIAGNOSIS — M2042 Other hammer toe(s) (acquired), left foot: Secondary | ICD-10-CM | POA: Diagnosis not present

## 2016-09-25 NOTE — Progress Notes (Signed)
   Subjective:    Patient ID: Carrie [name], female    DOB: 08/26/1976, 40 y.o.   MRN: [id number]  HPI    Review of Systems  All other systems reviewed and are negative.      Objective:   Physical Exam        Assessment & Plan:

## 2016-09-25 NOTE — Patient Instructions (Signed)

## 2016-09-26 NOTE — Progress Notes (Addendum)
Subjective:     Patient ID: Carrie KoyanagiLaura Yambao, female   DOB: 06/20/1976, 40 y.o.   MRN: [id number]  HPI patient presents for surgical consult from family physician with a damaged second metatarsophalangeal joint left with an elevated second toe medially dislocated with quite a bit of discomfort in the metatarsal phalangeal joint and also structural changes around the interphalangeal joint of the left second toe. Patient states it's been present for around 5 months and she is worn cast immobilization she has tried different devices to lower the toe without any success   Review of Systems  All other systems reviewed and are negative.      Objective:   Physical Exam  Constitutional: She is oriented to person, place, and time.  Cardiovascular: Intact distal pulses.   Musculoskeletal: Normal range of motion.  Neurological: She is oriented to person, place, and time.  Skin: Skin is warm.  Nursing note and vitals reviewed.  neurovascular status intact muscle strength adequate range of motion within normal limits with patient found to have significant dorsal and medial dislocation of the second digit at the metatarsophalangeal joint and mild deformity on the right foot. 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 Due to long-standing nature problem and failure to respond to conservative care I do think this will require digital repositioning with osteotomy second metatarsal and possible repair of the flexor plate if possible. Patient understands no guarantee that the position of the second toe really turn 20 normal position but we should be able to get her out of pain and improve her condition. At  this point I did allow her to read consent form reviewing alternative treatments complications associated with this surgery and she wants procedure signing consent form. She is given instructions if any issues were to come up or if she should have any questions she is encouraged or to come in. Total recovery will take 6 months to one year and again there is absolutely no long-term guarantees as to the long-term position of the second toe  X-ray report indicates that there is significant movement of the second digit with dorsal medial dislocation left foot with no indications of stress fracture currently with possibility for bone pathology but most likely a rupture of the joint surface

## 2016-09-27 ENCOUNTER — Encounter: Payer: Self-pay | Admitting: Internal Medicine

## 2016-09-27 ENCOUNTER — Ambulatory Visit (INDEPENDENT_AMBULATORY_CARE_PROVIDER_SITE_OTHER): Payer: BC Managed Care – PPO | Admitting: Internal Medicine

## 2016-09-27 DIAGNOSIS — K219 Gastro-esophageal reflux disease without esophagitis: Secondary | ICD-10-CM

## 2016-09-27 DIAGNOSIS — J4520 Mild intermittent asthma, uncomplicated: Secondary | ICD-10-CM

## 2016-09-27 DIAGNOSIS — S92322G Displaced fracture of second metatarsal bone, left foot, subsequent encounter for fracture with delayed healing: Secondary | ICD-10-CM

## 2016-09-27 DIAGNOSIS — E7800 Pure hypercholesterolemia, unspecified: Secondary | ICD-10-CM | POA: Diagnosis not present

## 2016-09-27 DIAGNOSIS — R41840 Attention and concentration deficit: Secondary | ICD-10-CM

## 2016-09-27 MED ORDER — PREDNISONE 10 MG PO TABS: ORAL_TABLET | ORAL | 0 refills | Status: DC

## 2016-09-27 NOTE — Progress Notes (Signed)
Patient ID: Carrie KoyanagiLaura Crookshanks, female   DOB: 01/29/1976, 40 y.o.   MRN: [id number]   Subjective:    Patient ID: Carrie KoyanagiLaura Crissman, female    DOB: 06/23/1976, 40 y.o.   MRN: [id number]  HPI  Patient here for a scheduled follow up.  Starting two weeks ago, she started having some increased cough and congestion.  Some nasal congestion started in the last 1-2 days.  Some drainage.  Some wheezing.  Coughing fits.  Worse when going from warm to cold and also if walks fast.  Has taken delsym, alleve D and hydrocodone cough syrup.  Started symbicort.  No acid reflux.  No abdominal pain.  Bowels stable.  Does report trouble focusing and staying on task.  This has been an issue for years.  States will start something and then move to something else before complete.     Past Medical History:  Diagnosis Date  . Abnormal Pap smear   . Allergy   . 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 Asthma    exercised induced  . Depression   . Headache(784.0)   . Mononucleosis 1998  . PONV (postoperative nausea and vomiting)    Past Surgical History:  Procedure Laterality Date  . CARPAL TUNNEL RELEASE Bilateral 2016  . CESAREAN SECTION N/A 10/30/2013   Procedure: CESAREAN SECTION;  Surgeon: Meriel Picaichard M Holland, MD;  Location: [facility];  Service: Obstetrics;  Laterality: N/A;  PRIMARY  . COLPOSCOPY W/ BIOPSY / CURETTAGE    . DE QUERVAIN'S RELEASE Left 2016  . DILATION AND CURETTAGE OF UTERUS    . FOOT FRACTURE SURGERY Left   . HAND SURGERY Left    Fracture repair  . IVF     Family History  Problem Relation Age of Onset  . Arthritis Mother   . Diverticulitis Mother   . Thyroid disease Mother   . GER disease Mother   . Migraines Mother   . Arthritis Father   . Hypertension Father   . Hyperlipidemia Father   . Thyroid disease Father   . Diabetes Maternal Grandmother   . Heart attack Maternal Grandmother   . Alzheimer's disease Maternal Grandmother   . Heart disease Paternal Grandfather   . Heart attack Paternal Grandfather   .  Anemia Sister   . Thyroid disease Sister   . Healthy Brother   . Emphysema Maternal Grandfather   . Breast cancer Paternal Grandmother   . Thyroid disease Sister   . Bipolar disorder Sister   . Arrhythmia Sister     Ventricular Tachycardia   Social History   Social History  . Marital status: Married    Spouse name: N/A  . Number of children: 1  . Years of education: Master's   Occupational History  . Part-time Insurance account managerteacher     [employer]   Social History Main Topics  . Smoking status: Never Smoker  . Smokeless tobacco: Never Used  . Alcohol use No  . Drug use: No  . Sexual activity: Not Currently     Comment: couple is afraid  to mess something up   Other Topics Concern  . None   Social History Narrative  . None    Outpatient Encounter Prescriptions as of 09/27/2016  Medication Sig  . acetaminophen (TYLENOL) 500 MG tablet Take 1,000 mg by mouth every 6 (six) hours as needed for pain.  Marland Kitchen. albuterol (PROAIR HFA) 108 (90 BASE) MCG/ACT inhaler Inhale into the lungs.  Marland Kitchen. alendronate (FOSAMAX) 70 MG tablet Take 1  tablet (70 mg total) by mouth every 7 (seven) days. Take with a full glass of water on an empty stomach.  Marland Kitchen buPROPion (WELLBUTRIN XL) 150 MG 24 hr tablet   . fluticasone (FLONASE) 50 MCG/ACT nasal spray Place 2 sprays into both nostrils daily.  Marland Kitchen ibuprofen (ADVIL,MOTRIN) 800 MG tablet Take 1 tablet (800 mg total) by mouth every 8 (eight) hours as needed for moderate pain.  Marland Kitchen lansoprazole (PREVACID) 15 MG capsule Take by mouth.  . loratadine (CLARITIN) 10 MG tablet Take 10 mg by mouth daily.  . Magnesium Oxide 400 MG CAPS Take 1 capsule (400 mg total) by mouth daily.  . montelukast (SINGULAIR) 10 MG tablet TAKE 1 TABLET (10 MG TOTAL) BY MOUTH AT BEDTIME.  Marland Kitchen scopolamine (TRANSDERM-SCOP, 1.5 MG,) 1 MG/3DAYS Place 1 patch (1.5 mg total) onto the skin every 3 (three) days.  . SUMAtriptan (IMITREX) 50 MG tablet Take 1 tablet (50 mg total) by mouth every 2 (two) hours  as needed for migraine. May repeat in 2 hours if headache persists or recurs.  . topiramate (TOPAMAX) 25 MG tablet TAKE 2 TABLETS BY MOUTH TWICE A DAY  . Vitamin D, Ergocalciferol, (DRISDOL) 50000 units CAPS capsule TAKE 1 CAPSULE WEEKLY.  . predniSONE (DELTASONE) 10 MG tablet Take 6 tablets x 1 day and then decrease by 1/2 tablet per day until down to zero mg.   No facility-administered encounter medications on file as of 09/27/2016.     Review of Systems  Constitutional: Negative for appetite change and unexpected weight change.  HENT: Positive for congestion and postnasal drip.   Respiratory: Positive for cough and wheezing. Negative for chest tightness and shortness of breath.   Cardiovascular: Negative for chest pain, palpitations and leg swelling.  Gastrointestinal: Negative for abdominal pain, diarrhea, nausea and vomiting.  Genitourinary: Negative for difficulty urinating and dysuria.  Musculoskeletal: Negative for back pain and joint swelling.  Skin: Negative for color change and rash.  Neurological: Negative for dizziness, light-headedness and headaches.  Psychiatric/Behavioral: Negative for agitation and dysphoric mood.       Objective:    Physical Exam  Constitutional: She appears well-developed and well-nourished. No distress.  HENT:  Nose: Nose normal.  Mouth/Throat: Oropharynx is clear and moist.  Neck: Neck supple. No thyromegaly present.  Cardiovascular: Normal rate and regular rhythm.   Pulmonary/Chest: Breath sounds normal. No respiratory distress.  Increased cough with expiration and forced expiration.    Abdominal: Soft. Bowel sounds are normal. There is no tenderness.  Musculoskeletal: She exhibits no edema or tenderness.  Lymphadenopathy:    She has no cervical adenopathy.  Skin: No rash noted. No erythema.  Psychiatric: She has a normal mood and affect. Her behavior is normal.    BP 130/80   Pulse 90   Temp 98.4 F (36.9 C) (Oral)   Ht 5\' 3"  (1.6  m)   Wt 195 lb 3.2 oz (88.5 kg)   LMP 09/05/2016 (Exact Date)   SpO2 98%   BMI 34.58 kg/m  Wt Readings from Last 3 Encounters:  09/27/16 195 lb 3.2 oz (88.5 kg)  09/09/16 194 lb (88 kg)  07/31/16 194 lb 9.6 oz (88.3 kg)     Lab Results  Component Value Date   WBC 5.6 08/08/2016   HGB 12.5 08/08/2016   HCT 36.7 08/08/2016   PLT 317.0 08/08/2016   GLUCOSE 88 08/08/2016   CHOL 196 08/08/2016   TRIG 143.0 08/08/2016   HDL 44.60 08/08/2016   LDLCALC 122 (H)  08/08/2016   ALT 12 08/08/2016   AST 15 08/08/2016   NA 139 08/08/2016   K 3.6 08/08/2016   CL 110 08/08/2016   CREATININE 0.71 08/08/2016   BUN 13 08/08/2016   CO2 21 08/08/2016   TSH 1.87 08/08/2016    [place] Extrem Low Left Ltd  Result Date: 09/10/2016 Limited musculoskeletal ultrasound was performed and interpreted by Judi SaaZachary M Smith Limited ultrasound shows patient continues to have a synovitis but otherwise no bony normality noted of the second metatarsal Impression: Continued synovitis.   Dg Bone Density  Result Date: 09/09/2016 EXAM: DUAL X-RAY ABSORPTIOMETRY (DXA) FOR BONE MINERAL DENSITY IMPRESSION: Dear Dr. Lorin PicketScott, Your patient Carrie KoyanagiLaura Saperstein completed a BMD test on 09/09/2016 using the Lunar iDXA DXA System (analysis version: 14.10) manufactured by [employer]. The following summarizes the results of our evaluation. PATIENT BIOGRAPHICAL: Name: Carrie KoyanagiCalone, [place] Patient ID: [id number] Birth Date: [birth year] Height: 62.0 in. Gender: Female Exam Date: 09/09/2016 Weight: 193.0 lbs. Indications: asthma, Caucasian, History of Compression Fracture, History of Fracture (Adult), Premenopausal Fractures: Ankle, Foot, Spinal compression fx, thumb Treatments: fosamax, Vit D ASSESSMENT: The BMD measured at Femur Neck Left is 0.825 g/cm2 with a Z-score of -1.5. The Z-score is within the expected range for age. ISCD recommends using Z-scores for assesment of pre-menopausal women, men between 1720 and 1550 yoa, and children under 20 yoa. The  diagnosis of osteoprosis in these patients should not be made of densitometric critria alone. Site Region Measured Measured WHO Age Matched BMD Date       Age      Classification Z-Score AP Spine L1-L4 09/09/2016 39.6 N/A 0.1 1.205 g/cm2 DualFemur Neck Left 09/09/2016 39.6 N/A -1.1 0.825 g/cm2 World Health Organization [facility](WHO) criteria for post-menopausal, Caucasian Women: Normal:       T-score at or above -1 SD Osteopenia:   T-score between -1 and -2.5 SD Osteoporosis: T-score at or below -2.5 SD RECOMMENDATIONS: All patients should ensure an adequate intake of dietary calcium (1200 mg/d) and vitamin D (800 IU daily) unless contraindicated. FOLLOW-UP: People with diagnosed cases of osteoporosis or osteopenia should be regularly tested for bone mineral density. For patients eligible for Medicare, routine testing is allowed once every 2 years. The testing frequency can be increased to one year for patients who have rapidly progressing disease, or for those who are receiving medical therapy to restore bone mass. I have reviewed this report, and agree with the above findings. [facility] Electronically Signed   By: Natasha MeadLiviu  Pop M.D.   On: 09/09/2016 12:14       Assessment & Plan:   Problem List Items Addressed This Visit    Acid reflux    Controlled on prevacid.       Airway hyperreactivity    Previously reported exercise induced asthma.  With increased cough now.  Symptoms and exam as outlined.  Increased cough with expiration and with talking a lot.  Treat with prednisone taper as directed.  Saline nasal spray and nasacort as directed.  Robitussin DM as directed.  Follow.  Do not feel need for abx at this time.  Follow.        Relevant Medications   predniSONE (DELTASONE) 10 MG tablet   Closed displaced fracture of second metatarsal bone of left foot    Planning for surgery.  Follow.  Seeing ortho.       Hypercholesteremia    Low cholesterol diet and exercise.  Follow  Lipid panel.   Lab  Results  Component  Value Date   CHOL 196 08/08/2016   HDL 44.60 08/08/2016   LDLCALC 122 (H) 08/08/2016   TRIG 143.0 08/08/2016   CHOLHDL 4 08/08/2016        Lack of concentration    Trouble focusing and completing task.  Has been an issue for years.  Some increased stress.  Discussed with her today.  Refer to psychiatry for formal evaluation and testing.  Pt in agreement.        Relevant Orders   Ambulatory referral to Psychiatry       Dale [place], MD

## 2016-09-27 NOTE — Patient Instructions (Signed)
Saline nasal spray - flush nose at least 2-3x/day  nasacort nasal spray - 2 sprays each nostril one time per day.  Do this in the evening.    Robitussin DM twice a day as needed.  

## 2016-09-27 NOTE — Progress Notes (Signed)
Pre visit review using our clinic review tool, if applicable. No additional management support is needed unless otherwise documented below in the visit note. 

## 2016-09-29 ENCOUNTER — Encounter: Payer: Self-pay | Admitting: Internal Medicine

## 2016-09-29 DIAGNOSIS — R41840 Attention and concentration deficit: Secondary | ICD-10-CM | POA: Insufficient documentation

## 2016-09-29 NOTE — Assessment & Plan Note (Signed)
Previously reported exercise induced asthma.  With increased cough now.  Symptoms and exam as outlined.  Increased cough with expiration and with talking a lot.  Treat with prednisone taper as directed.  Saline nasal spray and nasacort as directed.  Robitussin DM as directed.  Follow.  Do not feel need for abx at this time.  Follow.

## 2016-09-29 NOTE — Assessment & Plan Note (Signed)
Trouble focusing and completing task.  Has been an issue for years.  Some increased stress.  Discussed with her today.  Refer to psychiatry for formal evaluation and testing.  Pt in agreement.

## 2016-09-29 NOTE — Assessment & Plan Note (Signed)
Controlled on prevacid.   

## 2016-09-29 NOTE — Assessment & Plan Note (Signed)
Planning for surgery.  Follow.  Seeing ortho.

## 2016-09-29 NOTE — Assessment & Plan Note (Signed)
Low cholesterol diet and exercise.  Follow  Lipid panel.   Lab Results  Component Value Date   CHOL 196 08/08/2016   HDL 44.60 08/08/2016   LDLCALC 122 (H) 08/08/2016   TRIG 143.0 08/08/2016   CHOLHDL 4 08/08/2016

## 2016-10-06 ENCOUNTER — Other Ambulatory Visit: Payer: Self-pay | Admitting: Internal Medicine

## 2016-10-17 ENCOUNTER — Encounter: Payer: Self-pay | Admitting: Internal Medicine

## 2016-10-18 NOTE — Telephone Encounter (Signed)
If she is continuing to have problems, she probably needs to be reevaluated.  I can work her in next week, but if feel needs evaluation earlier - can see if someone can see her today.    Dr Lorin PicketScott

## 2016-10-21 ENCOUNTER — Ambulatory Visit (INDEPENDENT_AMBULATORY_CARE_PROVIDER_SITE_OTHER): Payer: BC Managed Care – PPO | Admitting: Family

## 2016-10-21 ENCOUNTER — Encounter: Payer: Self-pay | Admitting: Family

## 2016-10-21 VITALS — BP 134/80 | HR 91 | Temp 98.5°F | Ht 63.0 in | Wt 194.4 lb

## 2016-10-21 DIAGNOSIS — J4520 Mild intermittent asthma, uncomplicated: Secondary | ICD-10-CM

## 2016-10-21 DIAGNOSIS — J0120 Acute ethmoidal sinusitis, unspecified: Secondary | ICD-10-CM | POA: Diagnosis not present

## 2016-10-21 DIAGNOSIS — J0101 Acute recurrent maxillary sinusitis: Secondary | ICD-10-CM | POA: Insufficient documentation

## 2016-10-21 MED ORDER — PREDNISONE 10 MG PO TABS: ORAL_TABLET | ORAL | 0 refills | Status: DC

## 2016-10-21 MED ORDER — AMOXICILLIN-POT CLAVULANATE ER 1000-62.5 MG PO TB12: 2.0000 | ORAL_TABLET | Freq: Two times a day (BID) | ORAL | 0 refills | Status: DC

## 2016-10-21 NOTE — Assessment & Plan Note (Signed)
Patient and I jointly agreed to start antibiotic due to duration of symptoms. Return precautions given.

## 2016-10-21 NOTE — Progress Notes (Signed)
Subjective:    Patient ID: Carrie [name], female    DOB: 03/28/1976, 40 y.o.   MRN: [id number]  CC: Carrie [name] is a 40 y.o. female who presents today for an acute visit.    HPI: CC: cough, nasal congestion x one week and half, worsening. Endorses sinus pressure, ear pressure, SOB with cough and cold air. No wheezing, fever.   Tried robitussin, delsym, inhaler with some relief. Recently started breo sample at home, unsure if helping. Allergy testing last year which was unreaveling per patient.   H/o allergic rhinitis, asthma Works asst principal      HISTORY:  Past Medical History:  Diagnosis Date  . Abnormal Pap smear   . Allergy   . 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 Asthma    exercised induced  . Depression   . Headache(784.0)   . Mononucleosis 1998  . PONV (postoperative nausea and vomiting)    Past Surgical History:  Procedure Laterality Date  . CARPAL TUNNEL RELEASE Bilateral 2016  . CESAREAN SECTION N/A 10/30/2013   Procedure: CESAREAN SECTION;  Surgeon: Meriel Picaichard M Holland, MD;  Location: [facility];  Service: Obstetrics;  Laterality: N/A;  PRIMARY  . COLPOSCOPY W/ BIOPSY / CURETTAGE    . DE QUERVAIN'S RELEASE Left 2016  . DILATION AND CURETTAGE OF UTERUS    . FOOT FRACTURE SURGERY Left   . HAND SURGERY Left    Fracture repair  . IVF     Family History  Problem Relation Age of Onset  . Arthritis Mother   . Diverticulitis Mother   . Thyroid disease Mother   . GER disease Mother   . Migraines Mother   . Arthritis Father   . Hypertension Father   . Hyperlipidemia Father   . Thyroid disease Father   . Diabetes Maternal Grandmother   . Heart attack Maternal Grandmother   . Alzheimer's disease Maternal Grandmother   . Heart disease Paternal Grandfather   . Heart attack Paternal Grandfather   . Anemia Sister   . Thyroid disease Sister   . Healthy Brother   . Emphysema Maternal Grandfather   . Breast cancer Paternal Grandmother   . Thyroid disease Sister   . Bipolar  disorder Sister   . Arrhythmia Sister     Ventricular Tachycardia    Allergies: Patient has no known allergies. Current Outpatient Prescriptions on File Prior to Visit  Medication Sig Dispense Refill  . acetaminophen (TYLENOL) 500 MG tablet Take 1,000 mg by mouth every 6 (six) hours as needed for pain.    Marland Kitchen. albuterol (PROAIR HFA) 108 (90 BASE) MCG/ACT inhaler Inhale into the lungs.    Marland Kitchen. alendronate (FOSAMAX) 70 MG tablet Take 1 tablet (70 mg total) by mouth every 7 (seven) days. Take with a full glass of water on an empty stomach. 4 tablet 1  . buPROPion (WELLBUTRIN XL) 150 MG 24 hr tablet     . fluticasone (FLONASE) 50 MCG/ACT nasal spray Place 2 sprays into both nostrils daily. 16 g 6  . ibuprofen (ADVIL,MOTRIN) 800 MG tablet Take 1 tablet (800 mg total) by mouth every 8 (eight) hours as needed for moderate pain. 30 tablet 1  . lansoprazole (PREVACID) 15 MG capsule Take by mouth.    . loratadine (CLARITIN) 10 MG tablet Take 10 mg by mouth daily.    . magnesium oxide (MAG-OX) 400 (241.3 Mg) MG tablet TAKE 1 TABLET EVERY DAY 30 tablet 1  . montelukast (SINGULAIR) 10 MG tablet TAKE 1  TABLET (10 MG TOTAL) BY MOUTH AT BEDTIME. 30 tablet 6  . scopolamine (TRANSDERM-SCOP, 1.5 MG,) 1 MG/3DAYS Place 1 patch (1.5 mg total) onto the skin every 3 (three) days. 10 patch 0  . SUMAtriptan (IMITREX) 50 MG tablet Take 1 tablet (50 mg total) by mouth every 2 (two) hours as needed for migraine. May repeat in 2 hours if headache persists or recurs. 10 tablet 0  . topiramate (TOPAMAX) 25 MG tablet TAKE 2 TABLETS BY MOUTH TWICE A DAY 120 tablet 5  . Vitamin D, Ergocalciferol, (DRISDOL) 50000 units CAPS capsule TAKE 1 CAPSULE WEEKLY.  0   No current facility-administered medications on file prior to visit.     Social History  Substance Use Topics  . Smoking status: Never Smoker  . Smokeless tobacco: Never Used  . Alcohol use No    Review of Systems  Constitutional: Negative for chills and fever.  HENT:  Positive for congestion and sinus pressure.   Respiratory: Positive for cough and shortness of breath. Negative for wheezing.   Cardiovascular: Negative for chest pain and palpitations.  Gastrointestinal: Negative for nausea and vomiting.      Objective:    BP 134/80   Pulse 91   Temp 98.5 F (36.9 C) (Oral)   Ht  (1.6 m)   Wt 194 lb 6.4 oz (88.2 kg)   LMP 09/05/2016 (Exact Date)   SpO2 98%   BMI 34.44 kg/m    Physical Exam  Constitutional: She appears well-developed and well-nourished.  HENT:  Head: Normocephalic and atraumatic.  Right Ear: Hearing, tympanic membrane, external ear and ear canal normal. No drainage, swelling or tenderness. No foreign bodies. Tympanic membrane is not erythematous and not bulging. No middle ear effusion. No decreased hearing is noted.  Left Ear: Hearing, tympanic membrane, external ear and ear canal normal. No drainage, swelling or tenderness. No foreign bodies. Tympanic membrane is not erythematous and not bulging.  No middle ear effusion. No decreased hearing is noted.  Nose: Nose normal. No rhinorrhea. Right sinus exhibits no maxillary sinus tenderness and no frontal sinus tenderness. Left sinus exhibits no maxillary sinus tenderness and no frontal sinus tenderness.  Mouth/Throat: Uvula is midline, oropharynx is clear and moist and mucous membranes are normal. No oropharyngeal exudate, posterior oropharyngeal edema, posterior oropharyngeal erythema or tonsillar abscesses.  Eyes: Conjunctivae are normal.  Cardiovascular: Regular rhythm, normal heart sounds and normal pulses.   Pulmonary/Chest: Effort normal and breath sounds normal. She has no wheezes. She has no rhonchi. She has no rales.  Lymphadenopathy:       Head (right side): No submental, no submandibular, no tonsillar, no preauricular, no posterior auricular and no occipital adenopathy present.       Head (left side): No submental, no submandibular, no tonsillar, no preauricular, no  posterior auricular and no occipital adenopathy present.    She has no cervical adenopathy.  Neurological: She is alert.  Skin: Skin is warm and dry.  Psychiatric: She has a normal mood and affect. Her speech is normal and behavior is normal. Thought content normal.  Vitals reviewed.      Assessment & Plan:   Problem List Items Addressed This Visit      Respiratory   Airway hyperreactivity    Afebrile. SaO2 98%. No adventitious lung sounds. Advised patient to follow-up PCP regarding staying on Breo to see if this helps decrease asthma exacerbation and PO prednisone use. We jointly agreed to trial short of prednisone taper today and see  if this helps. Return precautions given.      Relevant Medications   predniSONE (DELTASONE) 10 MG tablet   Acute non-recurrent ethmoidal sinusitis - Primary    Patient and I jointly agreed to start antibiotic due to duration of symptoms. Return precautions given.      Relevant Medications   predniSONE (DELTASONE) 10 MG tablet   amoxicillin-clavulanate (AUGMENTIN XR) 1000-62.5 MG 12 hr tablet     \   I have discontinued Ms. Golphin's predniSONE. I am also having her start on predniSONE and amoxicillin-clavulanate. Additionally, I am having her maintain her acetaminophen, ibuprofen, albuterol, lansoprazole, SUMAtriptan, loratadine, buPROPion, Vitamin D (Ergocalciferol), montelukast, topiramate, fluticasone, scopolamine, alendronate, and magnesium oxide.   Meds ordered this encounter  Medications  . predniSONE (DELTASONE) 10 MG tablet    Sig: Take 4 tablets ( total 40 mg) by mouth for 2 days; take 3 tablets ( total 30 mg) by mouth for 2 days; take 2 tablets ( total 20 mg) by mouth for 1 day; take 1 tablet ( total 10 mg) by mouth for 1 day.    Dispense:  17 tablet    Refill:  0    Order Specific Question:   Supervising Provider    Answer:   Duncan Dull L [[id number]]  . amoxicillin-clavulanate (AUGMENTIN XR) 1000-62.5 MG 12 hr tablet    Sig: Take 2  tablets by mouth 2 (two) times daily.    Dispense:  14 tablet    Refill:  0    Order Specific Question:   Supervising Provider    Answer:   Sherlene Shams [[id number]]    Return precautions given.   Risks, benefits, and alternatives of the medications and treatment plan prescribed today were discussed, and patient expressed understanding.   Education regarding symptom management and diagnosis given to patient on AVS.  Continue to follow with Dale [place], MD for routine health maintenance.   Carrie [name] and I agreed with plan.   Rennie Plowman, FNP

## 2016-10-21 NOTE — Progress Notes (Signed)
Pre visit review using our clinic review tool, if applicable. No additional management support is needed unless otherwise documented below in the visit note. 

## 2016-10-21 NOTE — Assessment & Plan Note (Signed)
Afebrile. SaO2 98%. No adventitious lung sounds. Advised patient to follow-up PCP regarding staying on Breo to see if this helps decrease asthma exacerbation and PO prednisone use. We jointly agreed to trial short of prednisone taper today and see if this helps. Return precautions given.

## 2016-10-21 NOTE — Patient Instructions (Signed)
Trial prednisone Augmentin Probiotic  If there is no improvement in your symptoms, or if there is any worsening of symptoms, or if you have any additional concerns, please return for re-evaluation; or, if we are closed, consider going to the Emergency Room for evaluation if symptoms urgent.

## 2016-11-06 ENCOUNTER — Encounter: Payer: Self-pay | Admitting: Podiatry

## 2016-11-06 DIAGNOSIS — M2042 Other hammer toe(s) (acquired), left foot: Secondary | ICD-10-CM | POA: Diagnosis not present

## 2016-11-06 DIAGNOSIS — M21542 Acquired clubfoot, left foot: Secondary | ICD-10-CM | POA: Diagnosis not present

## 2016-11-12 ENCOUNTER — Telehealth: Payer: Self-pay

## 2016-11-12 NOTE — Telephone Encounter (Signed)
Spoke with patient regarding post op status DOS 11/06/16. She stated that she is doing well and managing her pain effectivley with motrin. Advised on boot usage at all times, even at night. She denied any s/s of infection. She is to call with any other questions or concerns

## 2016-11-15 ENCOUNTER — Ambulatory Visit (INDEPENDENT_AMBULATORY_CARE_PROVIDER_SITE_OTHER): Payer: BC Managed Care – PPO

## 2016-11-15 ENCOUNTER — Encounter: Payer: Self-pay | Admitting: Podiatry

## 2016-11-15 ENCOUNTER — Ambulatory Visit (INDEPENDENT_AMBULATORY_CARE_PROVIDER_SITE_OTHER): Payer: BC Managed Care – PPO | Admitting: Podiatry

## 2016-11-15 VITALS — Temp 96.9°F

## 2016-11-15 DIAGNOSIS — M216X9 Other acquired deformities of unspecified foot: Secondary | ICD-10-CM

## 2016-11-15 DIAGNOSIS — M2042 Other hammer toe(s) (acquired), left foot: Secondary | ICD-10-CM

## 2016-11-15 NOTE — Progress Notes (Signed)
Subjective:     Patient ID: Carrie KoyanagiLaura Sherrard, female   DOB: 06/10/1976, 41 y.o.   MRN: [id number]  HPI patient presents stating the patient is doing very well with minimal discomfort or swelling left foot   Review of Systems     Objective:   Physical Exam Neurovascular status intact muscle strength adequate with good alignment of the second digit left with pin in place and pin through the metatarsal with good alignment there also with good digital perfusion noted and negative Homans sign    Assessment:     Doing well post digital fusion digit 2 left with good sagittal plane motion and slight medial transverse that we will work on postoperative    Plan:     H&P condition reviewed and reapplied sterile dressing. I then instructed on continued elevation immobilization and to reappoint 4 weeks for pin removal or earlier if any issues should occur  X-rays indicate good positional component with the digit pin through the metatarsal

## 2016-11-28 ENCOUNTER — Encounter: Payer: Self-pay | Admitting: Podiatry

## 2016-11-29 ENCOUNTER — Telehealth: Payer: Self-pay | Admitting: *Deleted

## 2016-11-29 NOTE — Telephone Encounter (Signed)
Pt states her toes are numb and dark. I asked pt if they were the same temperature as the non-surgery foot and she said she could not tell she was currently icing the foot. I told pt that at this time in her recovery she should be icing only for comfort and increased swelling. I asked pt what kind of dressing she had on and she said an ace and I asked if she had a compression sock and she said yes. I asked pt if she was sleeping in the compression sock and she said yes. I told pt to take off the compression sock and only wear it during the day and not to bed, this was slowing down the return of blood to her heart. Pt states understanding.

## 2016-12-02 ENCOUNTER — Other Ambulatory Visit: Payer: Self-pay | Admitting: Physician Assistant

## 2016-12-02 DIAGNOSIS — G43019 Migraine without aura, intractable, without status migrainosus: Secondary | ICD-10-CM

## 2016-12-09 ENCOUNTER — Other Ambulatory Visit: Payer: Self-pay | Admitting: Physician Assistant

## 2016-12-09 DIAGNOSIS — J302 Other seasonal allergic rhinitis: Secondary | ICD-10-CM

## 2016-12-13 ENCOUNTER — Ambulatory Visit (INDEPENDENT_AMBULATORY_CARE_PROVIDER_SITE_OTHER): Payer: BC Managed Care – PPO | Admitting: Podiatry

## 2016-12-13 ENCOUNTER — Ambulatory Visit (INDEPENDENT_AMBULATORY_CARE_PROVIDER_SITE_OTHER): Payer: BC Managed Care – PPO

## 2016-12-13 DIAGNOSIS — M2042 Other hammer toe(s) (acquired), left foot: Secondary | ICD-10-CM

## 2016-12-13 DIAGNOSIS — M216X9 Other acquired deformities of unspecified foot: Secondary | ICD-10-CM | POA: Diagnosis not present

## 2016-12-13 NOTE — Progress Notes (Signed)
Subjective:     Patient ID: Leandrew KoyanagiLaura Mcquaig, female   DOB: 03/12/1976, 41 y.o.   MRN: [id number]  HPI patient states she's doing well with her left second toe and is ready to get the pin out   Review of Systems     Objective:   Physical Exam Neurovascular status intact negative Homans sign noted with well coapted incision site left second metatarsal second digit with pin in place and toe in a plantarflexed and slightly medial direction secondary to pain control    Assessment:     Doing well with digital fusion and metatarsal osteotomy second left    Plan:     Pin removal accomplished x-ray reviewed and applied BioSkin type ankle compression stocking with distal padding to lower and plantarflexed the second digit with instructions given and application of device today. Patient will be seen back for us to recheck again in approximately 3 weeks or earlier if needed and patient tolerated well the removal of pin. Sterile dressing applied to the end of the toe  X-ray indicated excellent healing of the osteotomy screw in place with no signs of movement

## 2016-12-18 NOTE — Progress Notes (Signed)
DOS 12.27.2017 Trans positional osteotomy 2nd left with possible repair flexor plate. Fusion with pin 2nd toe left.

## 2017-01-02 ENCOUNTER — Ambulatory Visit: Payer: BC Managed Care – PPO | Admitting: Internal Medicine

## 2017-01-09 ENCOUNTER — Ambulatory Visit (INDEPENDENT_AMBULATORY_CARE_PROVIDER_SITE_OTHER): Payer: BC Managed Care – PPO

## 2017-01-09 ENCOUNTER — Ambulatory Visit (INDEPENDENT_AMBULATORY_CARE_PROVIDER_SITE_OTHER): Payer: BC Managed Care – PPO | Admitting: Podiatry

## 2017-01-09 DIAGNOSIS — M216X9 Other acquired deformities of unspecified foot: Secondary | ICD-10-CM

## 2017-01-09 DIAGNOSIS — M2042 Other hammer toe(s) (acquired), left foot: Secondary | ICD-10-CM

## 2017-01-09 NOTE — Progress Notes (Signed)
Subjective:     Patient ID: Carrie KoyanagiLaura [name], female   DOB: 11/08/1976, 41 y.o.   MRN: [id number]  HPI patient states she's doing really well with her left foot with minimal discomfort   Review of Systems     Objective:   Physical Exam Neurovascular status intact negative Homans sign noted and wound edges well coapted left good alignment of the second toe with good motion of the second MPJ with no plantar pain    Assessment:     Doing well post surgery of the left foot    Plan:     Reviewed x-rays and allow patient to gradually increase activity with continued range of motion exercises encouraged  X-ray report indicates osteotomy is healing well screw in place good alignment of the toe

## 2017-01-10 ENCOUNTER — Ambulatory Visit: Payer: BC Managed Care – PPO

## 2017-01-14 ENCOUNTER — Ambulatory Visit: Payer: BC Managed Care – PPO | Admitting: Internal Medicine

## 2017-01-14 ENCOUNTER — Telehealth: Payer: Self-pay | Admitting: Internal Medicine

## 2017-01-14 DIAGNOSIS — Z0289 Encounter for other administrative examinations: Secondary | ICD-10-CM

## 2017-01-14 NOTE — Telephone Encounter (Signed)
pls advise if you would like to charge

## 2017-01-14 NOTE — Telephone Encounter (Signed)
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 Pt did make another appt. Let me know if you want me to charge for the visit no show or cancel appt. Thank you!

## 2017-03-03 ENCOUNTER — Ambulatory Visit (INDEPENDENT_AMBULATORY_CARE_PROVIDER_SITE_OTHER): Payer: BC Managed Care – PPO | Admitting: Internal Medicine

## 2017-03-03 ENCOUNTER — Encounter: Payer: Self-pay | Admitting: Internal Medicine

## 2017-03-03 DIAGNOSIS — G43829 Menstrual migraine, not intractable, without status migrainosus: Secondary | ICD-10-CM

## 2017-03-03 DIAGNOSIS — R41840 Attention and concentration deficit: Secondary | ICD-10-CM | POA: Diagnosis not present

## 2017-03-03 DIAGNOSIS — K219 Gastro-esophageal reflux disease without esophagitis: Secondary | ICD-10-CM | POA: Diagnosis not present

## 2017-03-03 DIAGNOSIS — E7800 Pure hypercholesterolemia, unspecified: Secondary | ICD-10-CM | POA: Diagnosis not present

## 2017-03-03 DIAGNOSIS — S92322G Displaced fracture of second metatarsal bone, left foot, subsequent encounter for fracture with delayed healing: Secondary | ICD-10-CM

## 2017-03-03 NOTE — Progress Notes (Signed)
Patient ID: Carrie [name], female   DOB: 08-05-1976, 41 y.o.   MRN: [id number]   Subjective:    Patient ID: Carrie [name], female    DOB: 07/20/1976, 41 y.o.   MRN: [id number]  HPI  Patient here for a scheduled follow up.  She has a history of migraine headaches.  Was started on trokendi XR - worsened headaches.  Since stopping, headaches have improved.  Continues on magnesium supplement.  Has maxalt if needed.  Received second SPG block.  Recommended f/u in 3 months.  She also has postpartum depression and anxiety.  She is on wellbutrin and adderall.  adderall is helping.  Can focus better.  Waiting for test results.  Overall feels better.  Allergies controlled relatively well.  Plans to restart claritin.  Some ear fullness, but is better.  No fever.  No chest congestion.     Past Medical History:  Diagnosis Date  . Abnormal Pap smear   . Allergy   . 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 Asthma    exercised induced  . Depression   . Headache(784.0)   . Mononucleosis 1998  . PONV (postoperative nausea and vomiting)    Past Surgical History:  Procedure Laterality Date  . CARPAL TUNNEL RELEASE Bilateral 2016  . CESAREAN SECTION N/A 10/30/2013   Procedure: CESAREAN SECTION;  Surgeon: Meriel Pica, MD;  Location: [facility];  Service: Obstetrics;  Laterality: N/A;  PRIMARY  . COLPOSCOPY W/ BIOPSY / CURETTAGE    . DE QUERVAIN'S RELEASE Left 2016  . DILATION AND CURETTAGE OF UTERUS    . FOOT FRACTURE SURGERY Left   . HAND SURGERY Left    Fracture repair  . IVF     Family History  Problem Relation Age of Onset  . Arthritis Mother   . Diverticulitis Mother   . Thyroid disease Mother   . GER disease Mother   . Migraines Mother   . Arthritis Father   . Hypertension Father   . Hyperlipidemia Father   . Thyroid disease Father   . Diabetes Maternal Grandmother   . Heart attack Maternal Grandmother   . Alzheimer's disease Maternal Grandmother   . Heart disease Paternal Grandfather   . Heart attack  Paternal Grandfather   . Anemia Sister   . Thyroid disease Sister   . Healthy Brother   . Emphysema Maternal Grandfather   . Breast cancer Paternal Grandmother   . Thyroid disease Sister   . Bipolar disorder Sister   . Arrhythmia Sister     Ventricular Tachycardia   Social History   Social History  . Marital status: Married    Spouse name: N/A  . Number of children: 1  . Years of education: Master's   Occupational History  . Part-time Insurance account manager   Social History Main Topics  . Smoking status: Never Smoker  . Smokeless tobacco: Never Used  . Alcohol use No  . Drug use: No  . Sexual activity: Not Currently     Comment: couple is afraid  to mess something up   Other Topics Concern  . None   Social History Narrative  . None    Outpatient Encounter Prescriptions as of 03/03/2017  Medication Sig  . acetaminophen (TYLENOL) 500 MG tablet Take 1,000 mg by mouth every 6 (six) hours as needed for pain.  Marland Kitchen albuterol (PROAIR HFA) 108 (90 BASE) MCG/ACT inhaler Inhale into the lungs.  Marland Kitchen buPROPion (WELLBUTRIN XL) 150 MG 24  hr tablet   . fluticasone (FLONASE) 50 MCG/ACT nasal spray Place 2 sprays into both nostrils daily.  Marland Kitchen ibuprofen (ADVIL,MOTRIN) 800 MG tablet Take 1 tablet (800 mg total) by mouth every 8 (eight) hours as needed for moderate pain.  Marland Kitchen lansoprazole (PREVACID) 15 MG capsule Take by mouth.  . loratadine (CLARITIN) 10 MG tablet Take 10 mg by mouth daily.  . magnesium oxide (MAG-OX) 400 (241.3 Mg) MG tablet TAKE 1 TABLET EVERY DAY  . rizatriptan (MAXALT) 10 MG tablet Take 1 tablet by mouth daily as needed.  Marland Kitchen scopolamine (TRANSDERM-SCOP, 1.5 MG,) 1 MG/3DAYS Place 1 patch (1.5 mg total) onto the skin every 3 (three) days.  . [DISCONTINUED] alendronate (FOSAMAX) 70 MG tablet Take 1 tablet (70 mg total) by mouth every 7 (seven) days. Take with a full glass of water on an empty stomach.  . [DISCONTINUED] amoxicillin-clavulanate (AUGMENTIN XR) 1000-62.5  MG 12 hr tablet Take 2 tablets by mouth 2 (two) times daily.  . [DISCONTINUED] buPROPion (WELLBUTRIN XL) 150 MG 24 hr tablet Take 150 mg by mouth 2 (two) times daily.  . [DISCONTINUED] HYDROcodone-acetaminophen (NORCO) 10-325 MG tablet Take 1 tablet by mouth every 4 (four) hours as needed.  . [DISCONTINUED] montelukast (SINGULAIR) 10 MG tablet TAKE 1 TABLET (10 MG TOTAL) BY MOUTH AT BEDTIME.  . [DISCONTINUED] predniSONE (DELTASONE) 10 MG tablet Take 4 tablets ( total 40 mg) by mouth for 2 days; take 3 tablets ( total 30 mg) by mouth for 2 days; take 2 tablets ( total 20 mg) by mouth for 1 day; take 1 tablet ( total 10 mg) by mouth for 1 day.  . [DISCONTINUED] SUMAtriptan (IMITREX) 50 MG tablet Take 1 tablet (50 mg total) by mouth every 2 (two) hours as needed for migraine. May repeat in 2 hours if headache persists or recurs.  . [DISCONTINUED] topiramate (TOPAMAX) 25 MG tablet TAKE 2 TABLETS BY MOUTH TWICE A DAY  . [DISCONTINUED] Vitamin D, Ergocalciferol, (DRISDOL) 50000 units CAPS capsule TAKE 1 CAPSULE WEEKLY.  Marland Kitchen amphetamine-dextroamphetamine (ADDERALL) 10 MG tablet Take 20 mg by mouth 2 (two) times daily.   No facility-administered encounter medications on file as of 03/03/2017.     Review of Systems  Constitutional: Negative for appetite change and unexpected weight change.  HENT: Negative for congestion, postnasal drip and sinus pressure.        Ear fullness.   Respiratory: Negative for cough, chest tightness and shortness of breath.   Cardiovascular: Negative for chest pain, palpitations and leg swelling.  Gastrointestinal: Negative for abdominal pain, diarrhea, nausea and vomiting.  Genitourinary: Negative for difficulty urinating and dysuria.  Musculoskeletal: Negative for back pain and joint swelling.  Skin: Negative for color change and rash.  Neurological: Negative for dizziness.       Headaches better.   Psychiatric/Behavioral: Negative for agitation and dysphoric mood.         Objective:    Physical Exam  Constitutional: She appears well-developed and well-nourished. No distress.  HENT:  Nose: Nose normal.  Mouth/Throat: Oropharynx is clear and moist.  Neck: Neck supple. No thyromegaly present.  Cardiovascular: Normal rate and regular rhythm.   Pulmonary/Chest: Breath sounds normal. No respiratory distress. She has no wheezes.  Abdominal: Soft. Bowel sounds are normal. There is no tenderness.  Musculoskeletal: She exhibits no edema or tenderness.  Lymphadenopathy:    She has no cervical adenopathy.  Skin: No rash noted. No erythema.  Psychiatric: She has a normal mood and affect. Her behavior is  normal.    BP 118/78 (BP Location: Left Arm, Patient Position: Sitting, Cuff Size: Normal)   Pulse 78   Temp 98.6 F (37 C) (Oral)   Resp 12   Ht  (1.6 m)   Wt 189 lb 6.4 oz (85.9 kg)   LMP 02/24/2017   SpO2 98%   BMI 33.55 kg/m  Wt Readings from Last 3 Encounters:  03/03/17 189 lb 6.4 oz (85.9 kg)  10/21/16 194 lb 6.4 oz (88.2 kg)  09/27/16 195 lb 3.2 oz (88.5 kg)     Lab Results  Component Value Date   WBC 5.6 08/08/2016   HGB 12.5 08/08/2016   HCT 36.7 08/08/2016   PLT 317.0 08/08/2016   GLUCOSE 88 08/08/2016   CHOL 196 08/08/2016   TRIG 143.0 08/08/2016   HDL 44.60 08/08/2016   LDLCALC 122 (H) 08/08/2016   ALT 12 08/08/2016   AST 15 08/08/2016   NA 139 08/08/2016   K 3.6 08/08/2016   CL 110 08/08/2016   CREATININE 0.71 08/08/2016   BUN 13 08/08/2016   CO2 21 08/08/2016   TSH 1.87 08/08/2016    [place] Extrem Low Left Ltd  Result Date: 09/10/2016 Limited musculoskeletal ultrasound was performed and interpreted by Judi Saa Limited ultrasound shows patient continues to have a synovitis but otherwise no bony normality noted of the second metatarsal Impression: Continued synovitis.   Dg Bone Density  Result Date: 09/09/2016 EXAM: DUAL X-RAY ABSORPTIOMETRY (DXA) FOR BONE MINERAL DENSITY IMPRESSION: Dear Dr. Lorin Picket, Your  patient Teckla Christiansen completed a BMD test on 09/09/2016 using the Lunar iDXA DXA System (analysis version: 14.10) manufactured by [employer]. The following summarizes the results of our evaluation. PATIENT BIOGRAPHICAL: Name: Saanvika, Vazques Patient ID: [id number] Birth Date: [birth year] Height: 62.0 in. Gender: Female Exam Date: 09/09/2016 Weight: 193.0 lbs. Indications: asthma, Caucasian, History of Compression Fracture, History of Fracture (Adult), Premenopausal Fractures: Ankle, Foot, Spinal compression fx, thumb Treatments: fosamax, Vit D ASSESSMENT: The BMD measured at Femur Neck Left is 0.825 g/cm2 with a Z-score of -1.5. The Z-score is within the expected range for age. ISCD recommends using Z-scores for assesment of pre-menopausal women, men between 41 and 36 yoa, and children under 20 yoa. The diagnosis of osteoprosis in these patients should not be made of densitometric critria alone. Site Region Measured Measured WHO Age Matched BMD Date       Age      Classification Z-Score AP Spine L1-L4 09/09/2016 39.6 N/A 0.1 1.205 g/cm2 DualFemur Neck Left 09/09/2016 39.6 N/A -1.1 0.825 g/cm2 World Health Organization [facility]) criteria for post-menopausal, Caucasian Women: Normal:       T-score at or above -1 SD Osteopenia:   T-score between -1 and -2.5 SD Osteoporosis: T-score at or below -2.5 SD RECOMMENDATIONS: All patients should ensure an adequate intake of dietary calcium (1200 mg/d) and vitamin D (800 IU daily) unless contraindicated. FOLLOW-UP: People with diagnosed cases of osteoporosis or osteopenia should be regularly tested for bone mineral density. For patients eligible for Medicare, routine testing is allowed once every 2 years. The testing frequency can be increased to one year for patients who have rapidly progressing disease, or for those who are receiving medical therapy to restore bone mass. I have reviewed this report, and agree with the above findings. [facility] Electronically Signed    By: Natasha Mead M.D.   On: 09/09/2016 12:14       Assessment & Plan:   Problem List Items Addressed This Visit  Acid reflux    Controlled on prevacid.        Closed displaced fracture of second metatarsal bone of left foot    Followed by ortho.        Headache, menstrual migraine    Seeing neurology.  s/p SPG block.  Doing better.  Follow.        Relevant Medications   rizatriptan (MAXALT) 10 MG tablet   Hypercholesteremia    Low cholesterol diet and exercise.  Follow lipid panel.        Lack of concentration    Recently tested for ADHD.  On adderall.  Doing better.  Follow.  Plans to f/u with Dr Maryruth Bun to discuss test results.            Dale Crofton, MD

## 2017-03-03 NOTE — Progress Notes (Signed)
Pre-visit discussion using our clinic review tool. No additional management support is needed unless otherwise documented below in the visit note.  

## 2017-03-05 ENCOUNTER — Encounter: Payer: Self-pay | Admitting: Internal Medicine

## 2017-03-07 ENCOUNTER — Ambulatory Visit (INDEPENDENT_AMBULATORY_CARE_PROVIDER_SITE_OTHER): Payer: BC Managed Care – PPO | Admitting: Podiatry

## 2017-03-07 ENCOUNTER — Ambulatory Visit (INDEPENDENT_AMBULATORY_CARE_PROVIDER_SITE_OTHER): Payer: BC Managed Care – PPO

## 2017-03-07 DIAGNOSIS — M216X9 Other acquired deformities of unspecified foot: Secondary | ICD-10-CM | POA: Diagnosis not present

## 2017-03-07 DIAGNOSIS — M2042 Other hammer toe(s) (acquired), left foot: Secondary | ICD-10-CM | POA: Diagnosis not present

## 2017-03-09 NOTE — Progress Notes (Signed)
Subjective: Carrie [name] is a 41 y.o. is seen today in office s/p left 2nd metatarsal osteotomy and hammertoe repair preformed on 11/06/2017. They state their pain has increased over the last 2 weeks and she has been having some swelling. She denies any injury. No change in activity level. She is wearing regular shoegear. Denies any systemic complaints such as fevers, chills, nausea, vomiting. No calf pain, chest pain, shortness of breath.   Objective: General: No acute distress, AAOx3  DP/PT pulses palpable 2/4, CRT < 3 sec to all digits.  Protective sensation intact. Motor function intact.  Right foot: Incision is well coapted without any evidence of dehiscence and a scar is well formed.. There is no surrounding erythema, ascending cellulitis, fluctuance, crepitus, malodor, drainage/purulence. There is mild edema around the surgical site. There is mild pain along the surgical site most along the metatarsal and the base of the digit. No pain with MTPJ ROM.  No other areas of tenderness to bilateral lower extremities.  No other open lesions or pre-ulcerative lesions.  No pain with calf compression, swelling, warmth, erythema.   Assessment and Plan:  Status post right foot surgery with some increase in pain and swelling over the last 2 weeks.   -Treatment options discussed including all alternatives, risks, and complications -X-rays were obtained and reviewed with the patient. Hardware inatact. There does appear to be a radiolucent line in the metatarsal which has changed compared to previous x-ray.  -Recommended return to the surgical shoe which she has at home she states -Limit activity -Ice/elevation -Monitor for any clinical signs or symptoms of infection and DVT/PE and directed to call the office immediately should any occur or go to the ER. -Follow-up in 2 weeks with Dr. Charlsie Merles or sooner if any problems arise. In the meantime, encouraged to call the office with any questions, concerns,  change in symptoms.   *x-rays next appointment   Ovid Curd, DPM

## 2017-03-10 ENCOUNTER — Encounter: Payer: Self-pay | Admitting: Podiatry

## 2017-03-15 ENCOUNTER — Encounter: Payer: Self-pay | Admitting: Internal Medicine

## 2017-03-15 NOTE — Assessment & Plan Note (Signed)
Controlled on prevacid.   

## 2017-03-15 NOTE — Assessment & Plan Note (Signed)
Recently tested for ADHD.  On adderall.  Doing better.  Follow.  Plans to f/u with Dr Maryruth BunKapur to discuss test results.

## 2017-03-15 NOTE — Assessment & Plan Note (Signed)
Low cholesterol diet and exercise.  Follow lipid panel.   

## 2017-03-15 NOTE — Assessment & Plan Note (Signed)
Followed by ortho 

## 2017-03-15 NOTE — Assessment & Plan Note (Signed)
Seeing neurology.  s/p SPG block.  Doing better.  Follow.

## 2017-03-20 ENCOUNTER — Ambulatory Visit (INDEPENDENT_AMBULATORY_CARE_PROVIDER_SITE_OTHER): Payer: BC Managed Care – PPO

## 2017-03-20 ENCOUNTER — Ambulatory Visit (INDEPENDENT_AMBULATORY_CARE_PROVIDER_SITE_OTHER): Payer: BC Managed Care – PPO | Admitting: Podiatry

## 2017-03-20 ENCOUNTER — Encounter: Payer: Self-pay | Admitting: Podiatry

## 2017-03-20 DIAGNOSIS — M2042 Other hammer toe(s) (acquired), left foot: Secondary | ICD-10-CM

## 2017-03-20 DIAGNOSIS — S92301A Fracture of unspecified metatarsal bone(s), right foot, initial encounter for closed fracture: Secondary | ICD-10-CM

## 2017-03-20 NOTE — Progress Notes (Signed)
Subjective:    Patient ID: Carrie [name], female   DOB: 41 y.o.   MRN: [id number]   HPI patient presents stating that the foot has started to hurt and she stated it was doing very well with no problems and then the middle of April that started to hurt all of a sudden and has been bothering her since with surgical shoe of some help but not significant    ROS      Objective:  Physical Exam Neurovascular status intact negative Homans sign was noted with patient found to have quite a bit of irritation around the second metatarsophalangeal joint left with warmth in the area and pain when palpated. It is localized with excellent alignment of the second toe    Assessment:    Possibility that she may have developed a secondary fracture of the metatarsal a number of months after surgery with possible trauma present as she admits that she does tend to traumatized her foot     Plan:    H&P and condition reviewed. At this point I went ahead and I have recommended she immobilize this for a period of time. She will have a wedge shoe which was dispensed today and also uses her boot to keep weight off of this and I explained if it's not improving in the next 4-6 weeks we may need to consider other treatments. She will be seen back to check  X-ray indicates that there is some lucency around the distal portion of the metatarsal with no current indications of looseness of the screw but it cannot be discounted

## 2017-03-21 ENCOUNTER — Telehealth: Payer: Self-pay | Admitting: *Deleted

## 2017-03-21 NOTE — Telephone Encounter (Addendum)
Pt states she was to received a shoe yesterday that should take the weight off of the toes, but what she received has the wedge at the toe are and the heel is cut out. I told pt she should have a shoe with the toe area cut away and told her to go to the [place] office today before 5:00pm for an exchange. 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 I spoke with pt and she states she had left a message with someone covering for another staff member while they were at lunch and she had wanted to know if Dr. Charlsie Merlesegal had wanted her to continue with the Fosamax and she saw Dr. Stacie AcresMayer today and was told to follow up with Dr. Charlsie Merlesegal. I told pt I would present the Question about Fosamax to Dr. Charlsie Merlesegal and I would have schedulers contact pt tomorrow for an appt.05/02/2017-I informed pt Dr. Charlsie Merlesegal ordered the continuation of Fosamax.

## 2017-04-14 ENCOUNTER — Other Ambulatory Visit: Payer: Self-pay | Admitting: Internal Medicine

## 2017-04-14 DIAGNOSIS — J0140 Acute pansinusitis, unspecified: Secondary | ICD-10-CM

## 2017-04-18 ENCOUNTER — Ambulatory Visit (INDEPENDENT_AMBULATORY_CARE_PROVIDER_SITE_OTHER): Payer: BC Managed Care – PPO

## 2017-04-18 ENCOUNTER — Encounter: Payer: Self-pay | Admitting: Podiatry

## 2017-04-18 ENCOUNTER — Ambulatory Visit (INDEPENDENT_AMBULATORY_CARE_PROVIDER_SITE_OTHER): Payer: BC Managed Care – PPO | Admitting: Podiatry

## 2017-04-18 ENCOUNTER — Telehealth: Payer: Self-pay | Admitting: Podiatry

## 2017-04-18 DIAGNOSIS — M2042 Other hammer toe(s) (acquired), left foot: Secondary | ICD-10-CM | POA: Diagnosis not present

## 2017-04-18 DIAGNOSIS — S92301A Fracture of unspecified metatarsal bone(s), right foot, initial encounter for closed fracture: Secondary | ICD-10-CM | POA: Diagnosis not present

## 2017-04-18 NOTE — Telephone Encounter (Signed)
I forgot to ask a question when I was in there earlier and saw Dr. Charlsie Merlesegal. I was wondering if he wants me to take the medication I took towards the end of last year that helped me heal from my surgery again to help heal this fracture I have. Please let me know. Thank you.

## 2017-04-18 NOTE — Progress Notes (Signed)
Subjective:    Patient ID: Carrie KoyanagiLaura [name], female   DOB: 41 y.o.   MRN: [id number]   HPI patient states it seems quite a bit better but it still sore    ROS      Objective:  Physical Exam neurovascular status intact with patient's left foot still painful in the second metatarsal but improved from previous visit with diminished edema and calor     Assessment:   Appears to be improving from probable fracture or nonhealing of the second metatarsal at the metatarsal neck junction      Plan:    H&P condition reviewed and recommended continued immobilization with gradual activity increase over the next 4 weeks and we'll reevaluate and decide if anything else will be necessary for her. May need bone stimulator or possibly MRI to determine if there is a nonunion  X-ray indicates there is still quite a bit of lucency at the neck of the metatarsal second left

## 2017-04-28 ENCOUNTER — Ambulatory Visit (INDEPENDENT_AMBULATORY_CARE_PROVIDER_SITE_OTHER): Payer: BC Managed Care – PPO | Admitting: Podiatry

## 2017-04-28 ENCOUNTER — Ambulatory Visit (INDEPENDENT_AMBULATORY_CARE_PROVIDER_SITE_OTHER): Payer: BC Managed Care – PPO

## 2017-04-28 DIAGNOSIS — S92301A Fracture of unspecified metatarsal bone(s), right foot, initial encounter for closed fracture: Secondary | ICD-10-CM

## 2017-04-28 DIAGNOSIS — M84376A Stress fracture, unspecified foot, initial encounter for fracture: Secondary | ICD-10-CM

## 2017-04-28 DIAGNOSIS — S92321D Displaced fracture of second metatarsal bone, right foot, subsequent encounter for fracture with routine healing: Secondary | ICD-10-CM

## 2017-04-28 NOTE — Telephone Encounter (Signed)
Carrie [name]

## 2017-04-28 NOTE — Telephone Encounter (Signed)
Left message for pt to take the Magnesium Oxide.

## 2017-04-28 NOTE — Progress Notes (Signed)
This patient presents to the office with continued pain noted in her left forefoot. She states that she fell and area like a knot on the bottom of her left forefoot about a week ago, which has since resolved. She also says that she was feeling severe pain shooting through the top of her left foot last week.  She is very concerned that  these areas of pain  have developed and presents the office today for an evaluation of her left foot.  She has a history of having a second metatarsal osteotomy with screw fixation.  She was seen by Dr. Charlsie Merlesregal 10 days ago and he took an x-ray and stated that the surgical site was improving from p robable fracture or nonhealing of the second metatarsal neck.  She presents the office today wearing an OrthoWedge shoe in an effort to help to keep weight off the head of the second metatarsal as it heals.    Objective  neurovascular status is intact.  Healing is noted along the site of the skin incision. There is mild swelling noted on the dorsum of the second metatarsal head area.  Palpable pain noted to the plantar condyles of the third metatarsal of the left foot.  No swelling noted at this site.  There is palpable pain noted to the shaft of the third metatarsal of the left foot.  There is evidence of bone callus noted at the osteotomy site of the second metatarsal left foot  S/P foot surgery  . Possible early stress reaction of this soft of the third metatarsal left foot  ROV  x-rays were taken reveal no changes from the previous x-ray taken one week ago. No evidence of any bony pathology noted to the shaft of the third metatarsal left foot at this juncture. I discussed with the patient that she could be forming a stress fracture of the third metatarsal of the left foot.  I told her this would be the reason she is having her new symptoms.  At first I added padding to the OrthoWedge shoe.  After further discussion, she stated she still had a Cam Walker at home and I recommended that  she start wearing the cam walker.  I told her if the problem persists, she needs to make a follow-up appointment with Dr. Charlsie Merlesregal and a possible MRI would be appropriate to determine if there is a stress fracture third metatarsal.  Prescribe Mobic.   Carrie [name] DPM

## 2017-04-30 ENCOUNTER — Encounter: Payer: Self-pay | Admitting: Podiatry

## 2017-04-30 ENCOUNTER — Ambulatory Visit (INDEPENDENT_AMBULATORY_CARE_PROVIDER_SITE_OTHER): Payer: BC Managed Care – PPO | Admitting: Podiatry

## 2017-04-30 DIAGNOSIS — S92301A Fracture of unspecified metatarsal bone(s), right foot, initial encounter for closed fracture: Secondary | ICD-10-CM

## 2017-04-30 DIAGNOSIS — M779 Enthesopathy, unspecified: Secondary | ICD-10-CM | POA: Diagnosis not present

## 2017-04-30 MED ORDER — TRIAMCINOLONE ACETONIDE 10 MG/ML IJ SUSP
10.0000 mg | Freq: Once | INTRAMUSCULAR | Status: AC
  Administered 2017-04-30: 10 mg

## 2017-05-01 NOTE — Telephone Encounter (Signed)
Ok to keep up the fosamax

## 2017-05-01 NOTE — Progress Notes (Signed)
Subjective:    Patient ID: Leandrew KoyanagiLaura Gurr, female   DOB: 41 y.o.   MRN: [id number]   HPI patient states she's feeling quite a bit better but is developed some discomfort on the dorsal lateral aspect of the foot    ROS      Objective:  Physical Exam neurovascular status intact with significant diminishment of discomfort in the second metatarsal bone with pain now more in the lateral side of the midfoot area around the peroneal tertius complex     Assessment:    Appears to be more tenderness in its nature with hopeful bone healing occurring     Plan:    Discussed condition and at this point were to start an anti-inflammatory and I did carefully injected the lateral tendon complex 3 mg Kenalog 5 mg Xylocaine advised on heat ice therapy and gradual return soft shoe gear. May still require more advanced imaging if the left second metatarsal remains tender

## 2017-05-02 MED ORDER — ALENDRONATE SODIUM 70 MG PO TABS: 70.0000 mg | ORAL_TABLET | ORAL | 11 refills | Status: DC

## 2017-05-16 ENCOUNTER — Ambulatory Visit (INDEPENDENT_AMBULATORY_CARE_PROVIDER_SITE_OTHER): Payer: BC Managed Care – PPO | Admitting: Podiatry

## 2017-05-16 ENCOUNTER — Ambulatory Visit (INDEPENDENT_AMBULATORY_CARE_PROVIDER_SITE_OTHER): Payer: BC Managed Care – PPO

## 2017-05-16 DIAGNOSIS — S92321D Displaced fracture of second metatarsal bone, right foot, subsequent encounter for fracture with routine healing: Secondary | ICD-10-CM

## 2017-05-16 DIAGNOSIS — M84376A Stress fracture, unspecified foot, initial encounter for fracture: Secondary | ICD-10-CM

## 2017-05-16 NOTE — Progress Notes (Signed)
Subjective:    Patient ID: Carrie KoyanagiLaura [name], female   DOB: 41 y.o.   MRN: [id number]   HPI patient states her foot is feeling a lot better with diminished swelling and pain    ROS      Objective:  Physical Exam neurovascular status intact with significant diminishment of discomfort around the second metatarsal distal shaft and head with no pain on the lateral side of the foot     Assessment:   Appears to be healing from having probable subtle fracture of the left second metatarsal head      Plan:    Advised on the continuation of immobility to a degree with surgical shoe and gradual increase in activity over the next 2-4 weeks and if symptoms persist will be seen back  X-rays indicate that the screw is in place and appears that the area of lucency is healing and

## 2017-09-01 ENCOUNTER — Ambulatory Visit (INDEPENDENT_AMBULATORY_CARE_PROVIDER_SITE_OTHER): Payer: BC Managed Care – PPO

## 2017-09-01 ENCOUNTER — Ambulatory Visit: Payer: Self-pay

## 2017-09-01 ENCOUNTER — Encounter: Payer: Self-pay | Admitting: Podiatry

## 2017-09-01 ENCOUNTER — Ambulatory Visit (INDEPENDENT_AMBULATORY_CARE_PROVIDER_SITE_OTHER): Payer: BC Managed Care – PPO | Admitting: Podiatry

## 2017-09-01 VITALS — BP 135/86 | HR 76

## 2017-09-01 DIAGNOSIS — M7751 Other enthesopathy of right foot: Secondary | ICD-10-CM

## 2017-09-01 DIAGNOSIS — R52 Pain, unspecified: Secondary | ICD-10-CM

## 2017-09-01 DIAGNOSIS — M779 Enthesopathy, unspecified: Secondary | ICD-10-CM

## 2017-09-01 MED ORDER — MELOXICAM 15 MG PO TABS: 15.0000 mg | ORAL_TABLET | Freq: Every day | ORAL | 1 refills | Status: AC

## 2017-09-01 NOTE — Progress Notes (Signed)
   Subjective:    Patient ID: Carrie KoyanagiLaura [name], female    DOB: 03/20/1976, 41 y.o.   MRN: [id number]  HPI This patient presents today complaining of a painful left great toe joint with a rather acute onset approximately 3 days prior. Patient describes loading lumbar and after noticing increasing pain in or around the right great toe joint. The pain increases with standing and walking and reduces with rest and elevation. Patient denies any direct injury or previous history of this problem. She denies any other joint pain at this time   Review of Systems  All other systems reviewed and are negative.      Objective:   Physical Exam  Orientated 3  Vascular: No peripheral edema or calf tenderness bilaterally DP and PT pulses 2/4 bilaterally Capillary reflex within normal limits bilaterally  Neurological Sensation to 10 g monofilament wire intact 5/5 bilaterally Vibratory sensation reactive bilaterally Ankle reflexes reactive bilaterally  Dermatological: No open skin lesions bilaterally Well-healed surgical scar dorsal second MPJ left Texture and turgor within normal limits  Musculoskeletal: Pain upon range of motion right first MPJ without crepitus Mild palpable tenderness right first MPJ palpation without any palpable lesions There is no surrounding erythema extends to, edema, warmth around the right first MPJ  X-ray examination weightbearing right foot dated 09/01/2017 Intact bony structures without fracture and/or dislocation First MPJ joint space within normal limits No increased soft tissue density noted Radiographic impression: No acute bony abnormality noted weightbearing x-ray right foot dated 09/01/2017       Assessment & Plan:   Assessment: Capsulitis right first MPJ  Plan: Patient will begin wearing her existing surgical shoe on the right foot until pain and discomfort reduces Rx meloxicam 15 mg #30 sig one by mouth daily  Patient returned the symptoms do not  reduce in the next 30 days or sooner if she has concern

## 2017-09-01 NOTE — Patient Instructions (Signed)
Wear your existing flat surgical shoe or boot on the right foot daily until right great toe stops Begin taking meloxicam 15 mg by mouth once daily Return as symptoms do not improve in the next 30 days

## 2017-09-02 ENCOUNTER — Other Ambulatory Visit: Payer: Self-pay | Admitting: Podiatry

## 2017-09-02 ENCOUNTER — Other Ambulatory Visit: Payer: Self-pay

## 2017-09-02 ENCOUNTER — Encounter: Payer: Self-pay | Admitting: Internal Medicine

## 2017-09-02 ENCOUNTER — Ambulatory Visit (INDEPENDENT_AMBULATORY_CARE_PROVIDER_SITE_OTHER): Payer: BC Managed Care – PPO | Admitting: Internal Medicine

## 2017-09-02 VITALS — BP 130/72 | HR 80 | Temp 98.0°F | Resp 20 | Wt 188.2 lb

## 2017-09-02 DIAGNOSIS — G43019 Migraine without aura, intractable, without status migrainosus: Secondary | ICD-10-CM | POA: Diagnosis not present

## 2017-09-02 DIAGNOSIS — J0140 Acute pansinusitis, unspecified: Secondary | ICD-10-CM

## 2017-09-02 DIAGNOSIS — E7800 Pure hypercholesterolemia, unspecified: Secondary | ICD-10-CM

## 2017-09-02 DIAGNOSIS — G43829 Menstrual migraine, not intractable, without status migrainosus: Secondary | ICD-10-CM

## 2017-09-02 DIAGNOSIS — K219 Gastro-esophageal reflux disease without esophagitis: Secondary | ICD-10-CM

## 2017-09-02 DIAGNOSIS — R41840 Attention and concentration deficit: Secondary | ICD-10-CM

## 2017-09-02 DIAGNOSIS — M779 Enthesopathy, unspecified: Secondary | ICD-10-CM

## 2017-09-02 MED ORDER — FLUTICASONE PROPIONATE 50 MCG/ACT NA SUSP: 2.0000 | Freq: Every day | NASAL | 6 refills | Status: DC

## 2017-09-02 MED ORDER — ALBUTEROL SULFATE HFA 108 (90 BASE) MCG/ACT IN AERS: 2.0000 | INHALATION_SPRAY | Freq: Four times a day (QID) | RESPIRATORY_TRACT | 2 refills | Status: DC | PRN

## 2017-09-02 NOTE — Progress Notes (Signed)
Subjective:    Patient ID: Carrie [name], female    DOB: 11-15-1975, 41 y.o.   MRN: [id number]  HPI  Patient here for a scheduled follow up.  She is being followed by podiatry.  [ain base of base of right great toe. Evaluated 09/01/17 - diagnosed with capsulitis.  Placed in post op shoe and prescribed meloxicam.  She is seeing psychiatry.  On vyvanse.  Doing better on this medication.  Able to focus.  Feels better.  Headaches are better.  Seeing neurology.  S/p SPG block.  Helping.  Overall feels better.  Handling stress.  Trying to stay active.  No chest pain.  Breathing stable.  No acid reflux reported.  No abdominal pain or cramping.     Past Medical History:  Diagnosis Date  . Abnormal Pap smear   . Allergy   . 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 Asthma    exercised induced  . Depression   . Headache(784.0)   . Mononucleosis 1998  . PONV (postoperative nausea and vomiting)    Past Surgical History:  Procedure Laterality Date  . CARPAL TUNNEL RELEASE Bilateral 2016  . CESAREAN SECTION N/A 10/30/2013   Procedure: CESAREAN SECTION;  Surgeon: Meriel Pica, MD;  Location: [facility];  Service: Obstetrics;  Laterality: N/A;  PRIMARY  . COLPOSCOPY W/ BIOPSY / CURETTAGE    . DE QUERVAIN'S RELEASE Left 2016  . DILATION AND CURETTAGE OF UTERUS    . FOOT FRACTURE SURGERY Left   . HAND SURGERY Left    Fracture repair  . IVF     Family History  Problem Relation Age of Onset  . Arthritis Mother   . Diverticulitis Mother   . Thyroid disease Mother   . GER disease Mother   . Migraines Mother   . Arthritis Father   . Hypertension Father   . Hyperlipidemia Father   . Thyroid disease Father   . Diabetes Maternal Grandmother   . Heart attack Maternal Grandmother   . Alzheimer's disease Maternal Grandmother   . Heart disease Paternal Grandfather   . Heart attack Paternal Grandfather   . Anemia Sister   . Thyroid disease Sister   . Healthy Brother   . Emphysema Maternal Grandfather   . Breast  cancer Paternal Grandmother   . Thyroid disease Sister   . Bipolar disorder Sister   . Arrhythmia Sister        Ventricular Tachycardia   Social History   Social History  . Marital status: Married    Spouse name: N/A  . Number of children: 1  . Years of education: Master's   Occupational History  . Part-time Insurance account manager   Social History Main Topics  . Smoking status: Never Smoker  . Smokeless tobacco: Never Used  . Alcohol use No  . Drug use: No  . Sexual activity: Not Currently     Comment: couple is afraid  to mess something up   Other Topics Concern  . None   Social History Narrative  . None    Outpatient Encounter Prescriptions as of 09/02/2017  Medication Sig  . acetaminophen (TYLENOL) 500 MG tablet Take 1,000 mg by mouth every 6 (six) hours as needed for pain.  Marland Kitchen albuterol (PROAIR HFA) 108 (90 Base) MCG/ACT inhaler Inhale 2 puffs into the lungs every 6 (six) hours as needed for wheezing or shortness of breath.  . Cholecalciferol (VITAMIN D PO) Take 2 tablets by mouth daily.  Marland Kitchen  fluticasone (FLONASE) 50 MCG/ACT nasal spray Place 2 sprays into both nostrils daily.  Marland Kitchen. ibuprofen (ADVIL,MOTRIN) 800 MG tablet Take 1 tablet (800 mg total) by mouth every 8 (eight) hours as needed for moderate pain.  Marland Kitchen. lansoprazole (PREVACID) 15 MG capsule Take by mouth.  . loratadine (CLARITIN) 10 MG tablet Take 10 mg by mouth daily.  . magnesium oxide (MAG-OX) 400 (241.3 Mg) MG tablet TAKE 1 TABLET EVERY DAY  . meloxicam (MOBIC) 15 MG tablet Take 1 tablet (15 mg total) by mouth daily.  . rizatriptan (MAXALT) 10 MG tablet Take 1 tablet by mouth daily as needed.  Marland Kitchen. VYVANSE 40 MG capsule Take by mouth daily.  . [DISCONTINUED] albuterol (PROAIR HFA) 108 (90 BASE) MCG/ACT inhaler Inhale into the lungs.  . [DISCONTINUED] fluticasone (FLONASE) 50 MCG/ACT nasal spray PLACE 2 SPRAYS INTO BOTH NOSTRILS DAILY.  . [DISCONTINUED] alendronate (FOSAMAX) 70 MG tablet Take 1 tablet (70  mg total) by mouth every 7 (seven) days. Take with a full glass of water on an empty stomach.  . [DISCONTINUED] amphetamine-dextroamphetamine (ADDERALL) 10 MG tablet Take 20 mg by mouth 2 (two) times daily.  . [DISCONTINUED] estradiol (ESTRACE) 2 MG tablet estradiol 2 mg tablet  . [DISCONTINUED] metFORMIN (GLUCOPHAGE) 500 MG tablet metformin 500 mg tablet  . [DISCONTINUED] metFORMIN (GLUCOPHAGE-XR) 500 MG 24 hr tablet metformin ER 500 mg tablet,extended release 24 hr  . [DISCONTINUED] venlafaxine XR (EFFEXOR-XR) 75 MG 24 hr capsule    No facility-administered encounter medications on file as of 09/02/2017.     Review of Systems  Constitutional: Negative for appetite change and unexpected weight change.  HENT: Negative for congestion and sinus pressure.   Respiratory: Negative for cough, chest tightness and shortness of breath.   Cardiovascular: Negative for chest pain, palpitations and leg swelling.  Gastrointestinal: Negative for abdominal pain, diarrhea, nausea and vomiting.  Genitourinary: Negative for difficulty urinating and dysuria.  Musculoskeletal: Negative for myalgias.       Right foot pain as outlined.    Skin: Negative for color change and rash.  Neurological: Negative for dizziness.       Headaches better.    Psychiatric/Behavioral: Negative for agitation and dysphoric mood.       Objective:    Physical Exam  Constitutional: She appears well-developed and well-nourished. No distress.  HENT:  Nose: Nose normal.  Mouth/Throat: Oropharynx is clear and moist.  Neck: Neck supple. No thyromegaly present.  Cardiovascular: Normal rate and regular rhythm.   Pulmonary/Chest: Breath sounds normal. No respiratory distress. She has no wheezes.  Abdominal: Soft. Bowel sounds are normal. There is no tenderness.  Musculoskeletal: She exhibits no edema or tenderness.  Lymphadenopathy:    She has no cervical adenopathy.  Skin: No rash noted. No erythema.  Psychiatric: She has a  normal mood and affect. Her behavior is normal.    BP 130/72 (BP Location: Left Arm, Patient Position: Sitting, Cuff Size: Normal)   Pulse 80   Temp 98 F (36.7 C) (Oral)   Resp 20   Wt 188 lb 3.2 oz (85.4 kg)   SpO2 99%   BMI 33.34 kg/m  Wt Readings from Last 3 Encounters:  09/02/17 188 lb 3.2 oz (85.4 kg)  03/03/17 189 lb 6.4 oz (85.9 kg)  10/21/16 194 lb 6.4 oz (88.2 kg)     Lab Results  Component Value Date   WBC 5.6 08/08/2016   HGB 12.5 08/08/2016   HCT 36.7 08/08/2016   PLT 317.0 08/08/2016   GLUCOSE  88 08/08/2016   CHOL 196 08/08/2016   TRIG 143.0 08/08/2016   HDL 44.60 08/08/2016   LDLCALC 122 (H) 08/08/2016   ALT 12 08/08/2016   AST 15 08/08/2016   NA 139 08/08/2016   K 3.6 08/08/2016   CL 110 08/08/2016   CREATININE 0.71 08/08/2016   BUN 13 08/08/2016   CO2 21 08/08/2016   TSH 1.87 08/08/2016    [place] Extrem Low Left Ltd  Result Date: 09/10/2016 Limited musculoskeletal ultrasound was performed and interpreted by Judi Saa Limited ultrasound shows patient continues to have a synovitis but otherwise no bony normality noted of the second metatarsal Impression: Continued synovitis.   Dg Bone Density  Result Date: 09/09/2016 EXAM: DUAL X-RAY ABSORPTIOMETRY (DXA) FOR BONE MINERAL DENSITY IMPRESSION: Dear Dr. Lorin Picket, Your patient Azharia Surratt completed a BMD test on 09/09/2016 using the Lunar iDXA DXA System (analysis version: 14.10) manufactured by [employer]. The following summarizes the results of our evaluation. PATIENT BIOGRAPHICAL: Name: Oasis, Goehring Patient ID: [id number] Birth Date: 17-Sep-1976 Height: 62.0 in. Gender: Female Exam Date: 09/09/2016 Weight: 193.0 lbs. Indications: asthma, Caucasian, History of Compression Fracture, History of Fracture (Adult), Premenopausal Fractures: Ankle, Foot, Spinal compression fx, thumb Treatments: fosamax, Vit D ASSESSMENT: The BMD measured at Femur Neck Left is 0.825 g/cm2 with a Z-score of -1.5. The Z-score is  within the expected range for age. ISCD recommends using Z-scores for assesment of pre-menopausal women, men between 69 and 26 yoa, and children under 20 yoa. The diagnosis of osteoprosis in these patients should not be made of densitometric critria alone. Site Region Measured Measured WHO Age Matched BMD Date       Age      Classification Z-Score AP Spine L1-L4 09/09/2016 39.6 N/A 0.1 1.205 g/cm2 DualFemur Neck Left 09/09/2016 39.6 N/A -1.1 0.825 g/cm2 World Health Organization [facility]) criteria for post-menopausal, Caucasian Women: Normal:       T-score at or above -1 SD Osteopenia:   T-score between -1 and -2.5 SD Osteoporosis: T-score at or below -2.5 SD RECOMMENDATIONS: All patients should ensure an adequate intake of dietary calcium (1200 mg/d) and vitamin D (800 IU daily) unless contraindicated. FOLLOW-UP: People with diagnosed cases of osteoporosis or osteopenia should be regularly tested for bone mineral density. For patients eligible for Medicare, routine testing is allowed once every 2 years. The testing frequency can be increased to one year for patients who have rapidly progressing disease, or for those who are receiving medical therapy to restore bone mass. I have reviewed this report, and agree with the above findings. [facility] Radiology Electronically Signed   By: Natasha Mead M.D.   On: 09/09/2016 12:14       Assessment & Plan:   Problem List Items Addressed This Visit    Acid reflux    Controlled on prevacid.        Common migraine with intractable migraine    S/p SPG block.  Doing better.  Follow.        Headache, menstrual migraine    Seeing neurology.  S/p SPG block.  Doing better.  Follow.        Hypercholesteremia    Low cholesterol diet and exercise.  Follow lipid panel.        Lack of concentration    Being treated for ADHD.  Seeing Dr Maryruth Bun.  On vyvanse now.  Doing better.         Other Visit Diagnoses    Capsulitis    -  Primary   Seeing podiatry.  in post op  shoe.     Acute pansinusitis, recurrence not specified       Relevant Medications   fluticasone (FLONASE) 50 MCG/ACT nasal spray       Dale [place], MD

## 2017-09-05 ENCOUNTER — Encounter: Payer: Self-pay | Admitting: Internal Medicine

## 2017-09-05 NOTE — Assessment & Plan Note (Signed)
S/p SPG block.  Doing better.  Follow.

## 2017-09-05 NOTE — Assessment & Plan Note (Signed)
Controlled on prevacid.   

## 2017-09-05 NOTE — Assessment & Plan Note (Signed)
Being treated for ADHD.  Seeing Dr Maryruth BunKapur.  On vyvanse now.  Doing better.

## 2017-09-05 NOTE — Assessment & Plan Note (Signed)
Seeing neurology.  S/p SPG block.  Doing better.  Follow.

## 2017-09-05 NOTE — Assessment & Plan Note (Signed)
Low cholesterol diet and exercise.  Follow lipid panel.   

## 2017-09-27 ENCOUNTER — Encounter: Payer: Self-pay | Admitting: Emergency Medicine

## 2017-09-27 ENCOUNTER — Emergency Department: Payer: BC Managed Care – PPO

## 2017-09-27 ENCOUNTER — Other Ambulatory Visit: Payer: Self-pay

## 2017-09-27 ENCOUNTER — Emergency Department
Admission: EM | Admit: 2017-09-27 | Discharge: 2017-09-27 | Disposition: A | Discharge status: Home / Self Care | Payer: BC Managed Care – PPO | Attending: Emergency Medicine | Admitting: Emergency Medicine

## 2017-09-27 DIAGNOSIS — J45909 Unspecified asthma, uncomplicated: Secondary | ICD-10-CM | POA: Insufficient documentation

## 2017-09-27 DIAGNOSIS — Z79899 Other long term (current) drug therapy: Secondary | ICD-10-CM | POA: Diagnosis not present

## 2017-09-27 DIAGNOSIS — R1011 Right upper quadrant pain: Secondary | ICD-10-CM | POA: Diagnosis present

## 2017-09-27 DIAGNOSIS — R1010 Upper abdominal pain, unspecified: Secondary | ICD-10-CM

## 2017-09-27 DIAGNOSIS — K8050 Calculus of bile duct without cholangitis or cholecystitis without obstruction: Secondary | ICD-10-CM

## 2017-09-27 DIAGNOSIS — K8020 Calculus of gallbladder without cholecystitis without obstruction: Secondary | ICD-10-CM | POA: Diagnosis not present

## 2017-09-27 LAB — URINALYSIS, COMPLETE (UACMP) WITH MICROSCOPIC
BILIRUBIN URINE: NEGATIVE
GLUCOSE, UA: NEGATIVE mg/dL
HGB URINE DIPSTICK: NEGATIVE
Ketones, ur: NEGATIVE mg/dL
Leukocytes, UA: NEGATIVE
NITRITE: NEGATIVE
PH: 7 (ref 5.0–8.0)
Protein, ur: NEGATIVE mg/dL
SPECIFIC GRAVITY, URINE: 1.002 — AB (ref 1.005–1.030)

## 2017-09-27 LAB — COMPREHENSIVE METABOLIC PANEL
ALBUMIN: 4.6 g/dL (ref 3.5–5.0)
ALK PHOS: 49 U/L (ref 38–126)
ALT: 18 U/L (ref 14–54)
ANION GAP: 12 (ref 5–15)
AST: 26 U/L (ref 15–41)
BUN: 13 mg/dL (ref 6–20)
CALCIUM: 9.3 mg/dL (ref 8.9–10.3)
CO2: 24 mmol/L (ref 22–32)
Chloride: 104 mmol/L (ref 101–111)
Creatinine, Ser: 0.74 mg/dL (ref 0.44–1.00)
GFR calc Af Amer: >60 mL/min (ref >60)
GLUCOSE: 97 mg/dL (ref 65–99)
Potassium: 3.5 mmol/L (ref 3.5–5.1)
Sodium: 140 mmol/L (ref 135–145)
TOTAL PROTEIN: 7.7 g/dL (ref 6.5–8.1)
Total Bilirubin: 0.9 mg/dL (ref 0.3–1.2)

## 2017-09-27 LAB — CBC
HCT: 37.5 % (ref 35.0–47.0)
Hemoglobin: 13.1 g/dL (ref 12.0–16.0)
MCH: 30.5 pg (ref 26.0–34.0)
MCHC: 34.8 g/dL (ref 32.0–36.0)
MCV: 87.5 fL (ref 80.0–100.0)
Platelets: 325 10*3/uL (ref 150–440)
RBC: 4.29 MIL/uL (ref 3.80–5.20)
RDW: 13.2 % (ref 11.5–14.5)
WBC: 8.4 10*3/uL (ref 3.6–11.0)

## 2017-09-27 LAB — POCT PREGNANCY, URINE: Preg Test, Ur: NEGATIVE

## 2017-09-27 LAB — LIPASE, BLOOD: Lipase: 26 U/L (ref 11–51)

## 2017-09-27 MED ORDER — KETOROLAC TROMETHAMINE 30 MG/ML IJ SOLN
INTRAMUSCULAR | Status: AC
  Administered 2017-09-27: 30 mg via INTRAVENOUS
  Filled 2017-09-27: qty 1

## 2017-09-27 MED ORDER — MORPHINE SULFATE (PF) 4 MG/ML IV SOLN
4.0000 mg | Freq: Once | INTRAVENOUS | Status: AC
  Administered 2017-09-27: 4 mg via INTRAVENOUS
  Filled 2017-09-27: qty 1

## 2017-09-27 MED ORDER — KETOROLAC TROMETHAMINE 30 MG/ML IJ SOLN
30.0000 mg | Freq: Once | INTRAMUSCULAR | Status: AC
  Administered 2017-09-27: 30 mg via INTRAVENOUS

## 2017-09-27 MED ORDER — ONDANSETRON HCL 4 MG/2ML IJ SOLN
4.0000 mg | Freq: Once | INTRAMUSCULAR | Status: AC
  Administered 2017-09-27: 4 mg via INTRAVENOUS
  Filled 2017-09-27: qty 2

## 2017-09-27 MED ORDER — TRAMADOL HCL 50 MG PO TABS: 50.0000 mg | ORAL_TABLET | Freq: Four times a day (QID) | ORAL | 0 refills | Status: DC | PRN

## 2017-09-27 MED ORDER — SODIUM CHLORIDE 0.9 % IV SOLN
1000.0000 mL | Freq: Once | INTRAVENOUS | Status: AC
  Administered 2017-09-27: 1000 mL via INTRAVENOUS

## 2017-09-27 NOTE — ED Provider Notes (Signed)
[facility] Emergency Department Provider Note   ____________________________________________    I have reviewed the triage vital signs and the nursing notes.   HISTORY  Chief Complaint Abdominal Pain     HPI Carrie [name] is a 41 y.o. female who presents with complaints of upper abdominal pain.  Patient reports pain started at approximately 1 PM, she had eaten lunch.  She felt well this morning.  She reports the pain is sharp in nature primarily the right upper quadrant radiating around to the right mid back.  She has never had this before.  No history of abdominal surgery.  No fevers or chills.  She has not taken anything for this.  Does not drink alcohol daily.  Medical records noncontributory  Past Medical History:  Diagnosis Date  . Abnormal Pap smear   . Allergy   . 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 Asthma    exercised induced  . Depression   . Headache(784.0)   . Mononucleosis 1998  . PONV (postoperative nausea and vomiting)     Patient Active Problem List   Diagnosis Date Noted  . Acute non-recurrent ethmoidal sinusitis 10/21/2016  . Lack of concentration 09/29/2016  . Closed displaced fracture of second metatarsal bone of left foot 06/18/2016  . 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 Allergic rhinitis 06/13/2015  . H/O arthritis 06/13/2015  . 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 Acid reflux 06/13/2015  . Common migraine with intractable migraine 06/13/2015  . Hypercholesteremia 06/13/2015  . Headache, menstrual migraine 06/13/2015  . Pregnancy 10/30/2013    Past Surgical History:  Procedure Laterality Date  . CARPAL TUNNEL RELEASE Bilateral 2016  . CESAREAN SECTION N/A 10/30/2013   Performed by Meriel PicaHolland, Richard M[facility]  . COLPOSCOPY W/ BIOPSY / CURETTAGE    . DE QUERVAIN'S RELEASE Left 2016  . DILATION AND CURETTAGE OF UTERUS    . FOOT FRACTURE SURGERY Left   . HAND SURGERY Left    Fracture repair  . IVF      Prior to  Admission medications   Medication Sig Start Date End Date Taking? Authorizing Provider  Cholecalciferol (VITAMIN D PO) Take 2 tablets by mouth daily.   Yes [provider]  fluticasone (FLONASE) 50 MCG/ACT nasal spray Place 2 sprays into both nostrils daily. 09/02/17  Yes Dale DurhamScott, Charlene, MD  lansoprazole (PREVACID) 15 MG capsule Take by mouth.   Yes [provider]  loratadine (CLARITIN) 10 MG tablet Take 10 mg by mouth daily.   Yes [provider]  magnesium oxide (MAG-OX) 400 (241.3 Mg) MG tablet TAKE 1 TABLET EVERY DAY 10/07/16  Yes Dale DurhamScott, Charlene, MD  meloxicam (MOBIC) 15 MG tablet Take 1 tablet (15 mg total) by mouth daily. 09/01/17 10/01/17 Yes Tuchman, Richard C, DPM  VYVANSE 40 MG capsule Take by mouth daily. 08/19/17  Yes [provider]  acetaminophen (TYLENOL) 500 MG tablet Take 1,000 mg by mouth every 6 (six) hours as needed for pain.    [provider]  albuterol (PROAIR HFA) 108 (90 Base) MCG/ACT inhaler Inhale 2 puffs into the lungs every 6 (six) hours as needed for wheezing or shortness of breath. 09/02/17   Dale DurhamScott, Charlene, MD  ibuprofen (ADVIL,MOTRIN) 800 MG tablet Take 1 tablet (800 mg total) by mouth every 8 (eight) hours as needed for moderate pain. Patient not taking: Reported on 09/27/2017 11/02/13   Julio Sicksurtis, Carol, NP  Omega-3 Fatty Acids (FISH OIL BURP-LESS PO) Take by mouth.  [provider]  rizatriptan (MAXALT) 10 MG tablet Take 1 tablet by mouth daily as needed. 02/03/17   [provider]  traMADol (ULTRAM) 50 MG tablet Take 1 tablet (50 mg total) every 6 (six) hours as needed by mouth. 09/27/17   Jene EveryKinner, Fletcher Rathbun, MD     Allergies Patient has no known allergies.  Family History  Problem Relation Age of Onset  . Arthritis Mother   . Diverticulitis Mother   . Thyroid disease Mother   . GER disease Mother   . Migraines Mother   . Arthritis Father   . Hypertension Father   . Hyperlipidemia Father     . Thyroid disease Father   . Diabetes Maternal Grandmother   . Heart attack Maternal Grandmother   . Alzheimer's disease Maternal Grandmother   . Heart disease Paternal Grandfather   . Heart attack Paternal Grandfather   . Anemia Sister   . Thyroid disease Sister   . Healthy Brother   . Emphysema Maternal Grandfather   . Breast cancer Paternal Grandmother   . Thyroid disease Sister   . Bipolar disorder Sister   . Arrhythmia Sister        Ventricular Tachycardia    Social History Social History   Tobacco Use  . Smoking status: Never Smoker  . Smokeless tobacco: Never Used  Substance Use Topics  . Alcohol use: No  . Drug use: No    Review of Systems  Constitutional: No fever/chills Eyes: No visual changes.  ENT: No sore throat. Cardiovascular: Denies chest pain. Respiratory: Denies shortness of breath. Gastrointestinal: As above Genitourinary: Negative for dysuria. Musculoskeletal: Negative for back pain. Skin: Negative for rash. Neurological: Negative for headaches   ____________________________________________   PHYSICAL EXAM:  VITAL SIGNS: ED Triage Vitals  Enc Vitals Group     BP 09/27/17 1521 (!) 132/91     Pulse Rate 09/27/17 1521 88     Resp 09/27/17 1521 18     Temp 09/27/17 1521 98.2 F (36.8 C)     Temp Source 09/27/17 1521 Oral     SpO2 09/27/17 1521 100 %     Weight 09/27/17 1522 83 kg (183 lb)     Height 09/27/17 1522 1.6 m (5\' 3" )     Head Circumference --      Peak Flow --      Pain Score 09/27/17 1526 9     Pain Loc --      Pain Edu? --      Excl. in GC? --     Constitutional: Alert and oriented. . Pleasant and interactive Eyes: Conjunctivae are normal.   Nose: No congestion/rhinnorhea. Mouth/Throat: Mucous membranes are moist.    Cardiovascular: Normal rate, regular rhythm.  Good peripheral circulation. Respiratory: Normal respiratory effort.  No retractions.  Gastrointestinal: Mild tenderness palpation right upper quadrant.  no distention.  No CVA tenderness. Genitourinary: deferred Musculoskeletal:Warm and well perfused Neurologic:  Normal speech and language. No gross focal neurologic deficits are appreciated.  Skin:  Skin is warm, dry and intact. No rash noted. Psychiatric: Mood and affect are normal. Speech and behavior are normal.  ____________________________________________   LABS (all labs ordered are listed, but only abnormal results are displayed)  Labs Reviewed  URINALYSIS, COMPLETE (UACMP) WITH MICROSCOPIC - Abnormal; Notable for the following components:      Result Value   Color, Urine COLORLESS (*)    APPearance CLEAR (*)    Specific Gravity, Urine 1.002 (*)    Bacteria, UA  RARE (*)    Squamous Epithelial / LPF 0-5 (*)    All other components within normal limits  LIPASE, BLOOD  COMPREHENSIVE METABOLIC PANEL  CBC  POC URINE PREG, ED  POCT PREGNANCY, URINE   ____________________________________________  EKG  None ____________________________________________  RADIOLOGY  Ultrasound shows multiple gallstones, no evidence of cholecystitis ____________________________________________   PROCEDURES  Procedure(s) performed: No  Procedures   Critical Care performed: No ____________________________________________   INITIAL IMPRESSION / ASSESSMENT AND PLAN / ED COURSE  Pertinent labs & imaging results that were available during my care of the patient were reviewed by me and considered in my medical decision making (see chart for details).  Patient presents with upper abdominal pain.  Differential diagnosis includes cholelithiasis, biliary colic, PUD, pancreatitis, gastritis.  Lab work is reassuring, will give morphine IV, Zofran IV, IV fluids, obtain ultrasound of the right upper quadrant and reevaluate.  ----------------------------------------- 8:02 PM on 09/27/2017 -----------------------------------------  Ultrasound shows multiple gallstones, pain has returned  after ultrasound, no evidence of cholecystitis on ultrasound however strong clinical suspicion, will consult surgery for evaluation   Evaluated by Dr. Earlene Plater of general surgery he recommends outpatient follow-up the patient is comfortable with this plan.  Return precautions discussed    ____________________________________________   FINAL CLINICAL IMPRESSION(S) / ED DIAGNOSES  Final diagnoses:  Upper abdominal pain  Biliary colic        Note:  This document was prepared using Dragon voice recognition software and may include unintentional dictation errors.    Jene Every, MD 09/27/17 2138

## 2017-09-27 NOTE — ED Triage Notes (Signed)
Pt to ED with abd pain 1pm today with gradual RUQ pain getting severely worse, nausea no vomiting or diarrhea.  States was eating before this episode.  Denies SOB or urinary symptoms.  Still has gallbladder and appendix.

## 2017-09-27 NOTE — ED Notes (Addendum)
Dr Davis at bedside.

## 2017-09-27 NOTE — ED Notes (Signed)
ED Provider at bedside. 

## 2017-09-27 NOTE — Consult Note (Signed)
SURGICAL CONSULTATION NOTE (initial) - cpt: [id number]  HISTORY OF PRESENT ILLNESS (HPI):  41 y.o. female presented to [facility] ED earlier this evening for evaluation of RUQ > epigastric abdominal pain. Patient reports this is her first episode of such pain that began this afternoon after she ate a Mal[place] and cheese sandwich. Patient describes she may have had milder episodes of similar pain, but didn't think anything of it, and she initially attributed today's pain to gas or constipation until it got worse instead of better. While in the ED, morphine helped "numb" her pain, which then returned when the morphine "wore off". Patient more recently received Toradol, since which the pain has improved and does not appear to be returning yet. Patient otherwise denies any associated N/V, diarrhea or constipation, fever/chills, CP, or SOB.  Surgery is consulted by ED physician Dr. Cyril LoosenKinner in this context for evaluation and management of symptomatic cholelithiasis.  PAST MEDICAL HISTORY (PMH):  Past Medical History:  Diagnosis Date  . Abnormal Pap smear   . Allergy   . 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 Asthma    exercised induced  . Depression   . Headache(784.0)   . Mononucleosis 1998  . PONV (postoperative nausea and vomiting)      PAST SURGICAL HISTORY (PSH):  Past Surgical History:  Procedure Laterality Date  . CARPAL TUNNEL RELEASE Bilateral 2016  . CESAREAN SECTION N/A 10/30/2013   Performed by Meriel PicaHolland, Richard M, MD at [facility]  . COLPOSCOPY W/ BIOPSY / CURETTAGE    . DE QUERVAIN'S RELEASE Left 2016  . DILATION AND CURETTAGE OF UTERUS    . FOOT FRACTURE SURGERY Left   . HAND SURGERY Left    Fracture repair  . IVF       MEDICATIONS:  Prior to Admission medications   Medication Sig Start Date End Date Taking? Authorizing Provider  Cholecalciferol (VITAMIN D PO) Take 2 tablets by mouth daily.   Yes [provider]  fluticasone (FLONASE) 50 MCG/ACT nasal spray Place 2 sprays into both nostrils  daily. 09/02/17  Yes Dale DurhamScott, Charlene, MD  lansoprazole (PREVACID) 15 MG capsule Take by mouth.   Yes [provider]  loratadine (CLARITIN) 10 MG tablet Take 10 mg by mouth daily.   Yes [provider]  magnesium oxide (MAG-OX) 400 (241.3 Mg) MG tablet TAKE 1 TABLET EVERY DAY 10/07/16  Yes Dale DurhamScott, Charlene, MD  meloxicam (MOBIC) 15 MG tablet Take 1 tablet (15 mg total) by mouth daily. 09/01/17 10/01/17 Yes Tuchman, Richard C, DPM  VYVANSE 40 MG capsule Take by mouth daily. 08/19/17  Yes [provider]  acetaminophen (TYLENOL) 500 MG tablet Take 1,000 mg by mouth every 6 (six) hours as needed for pain.    [provider]  albuterol (PROAIR HFA) 108 (90 Base) MCG/ACT inhaler Inhale 2 puffs into the lungs every 6 (six) hours as needed for wheezing or shortness of breath. 09/02/17   Dale DurhamScott, Charlene, MD  ibuprofen (ADVIL,MOTRIN) 800 MG tablet Take 1 tablet (800 mg total) by mouth every 8 (eight) hours as needed for moderate pain. Patient not taking: Reported on 09/27/2017 11/02/13   Julio Sicksurtis, Carol, NP  Omega-3 Fatty Acids (FISH OIL BURP-LESS PO) Take by mouth.    [provider]  rizatriptan (MAXALT) 10 MG tablet Take 1 tablet by mouth daily as needed. 02/03/17   [provider]  traMADol (ULTRAM) 50 MG tablet Take 1 tablet (50 mg total) every 6 (six) hours as needed by mouth. 09/27/17  Kinner, Robert, MD     ALLERGIES:  No Known Allergies  Jene Every SOCIAL HISTORY:  Social History   Socioeconomic History  . Marital status: Married    Spouse name: Not on file  . Number of children: 1  . Years of education: Master's  . Highest education level: Not on file  Social Needs  . Financial resource strain: Not on file  . Food insecurity - worry: Not on file  . Food insecurity - inability: Not on file  . Transportation needs - medical: Not on file  . Transportation needs - non-medical: Not on file  Occupational History  . Occupation: Air cabin crewart-time teacher     Comment: [employer]  Tobacco Use  . Smoking status: Never Smoker  . Smokeless tobacco: Never Used  Substance and Sexual Activity  . Alcohol use: No  . Drug use: No  . Sexual activity: Not Currently    Comment: couple is afraid  to mess something up  Other Topics Concern  . Not on file  Social History Narrative  . Not on file    The patient currently resides (home / rehab facility / nursing home): Home The patient normally is (ambulatory / bedbound): Ambulatory   FAMILY HISTORY:  Family History  Problem Relation Age of Onset  . Arthritis Mother   . Diverticulitis Mother   . Thyroid disease Mother   . GER disease Mother   . Migraines Mother   . Arthritis Father   . Hypertension Father   . Hyperlipidemia Father   . Thyroid disease Father   . Diabetes Maternal Grandmother   . Heart attack Maternal Grandmother   . Alzheimer's disease Maternal Grandmother   . Heart disease Paternal Grandfather   . Heart attack Paternal Grandfather   . Anemia Sister   . Thyroid disease Sister   . Healthy Brother   . Emphysema Maternal Grandfather   . Breast cancer Paternal Grandmother   . Thyroid disease Sister   . Bipolar disorder Sister   . Arrhythmia Sister        Ventricular Tachycardia     REVIEW OF SYSTEMS:  Constitutional: denies weight loss, fever, chills, or sweats  Eyes: denies any other vision changes, history of eye injury  ENT: denies sore throat, hearing problems  Respiratory: denies shortness of breath, wheezing  Cardiovascular: denies chest pain, palpitations  Gastrointestinal: abdominal pain, N/V, and bowel function as per HPI Genitourinary: denies burning with urination or urinary frequency Musculoskeletal: denies any other joint pains or cramps  Skin: denies any other rashes or skin discolorations  Neurological: denies any other headache, dizziness, weakness  Psychiatric: denies any other depression, anxiety   All other review of systems were negative    VITAL SIGNS:  Temp:  [98.2 F (36.8 C)] 98.2 F (36.8 C) (11/17 1521) Pulse Rate:  [74-98] 75 (11/17 2030) Resp:  [18] 18 (11/17 1930) BP: (105-132)/(66-91) 117/71 (11/17 2030) SpO2:  [96 %-100 %] 99 % (11/17 2030) Weight:  [183 lb (83 kg)] 183 lb (83 kg) (11/17 1522)     Height: 5\' 3"  (160 cm) Weight: 183 lb (83 kg) BMI (Calculated): 32.43   INTAKE/OUTPUT:  This shift: No intake/output data recorded.  Last 2 shifts: @IOLAST2SHIFTS @   PHYSICAL EXAM:  Constitutional:  -- Normal body habitus  -- Awake, alert, and oriented x3, no apparent distress Eyes:  -- Pupils equally round and reactive to light  -- No scleral icterus, B/L no occular discharge Ear, nose, throat: -- Neck is FROM WNL --  No jugular venous distension  Pulmonary:  -- No wheezes or rhales -- Equal breath sounds bilaterally -- Breathing non-labored at rest Cardiovascular:  -- S1, S2 present  -- No pericardial rubs  Gastrointestinal:  -- Abdomen soft and non-distended with mild RUQ abdominal tenderness to palpation, no guarding or rebound tenderness -- No abdominal masses appreciated, pulsatile or otherwise  Musculoskeletal and Integumentary:  -- Wounds or skin discoloration: None appreciated -- Extremities: B/L UE and LE FROM, hands and feet warm, no edema  Neurologic:  -- Motor function: Intact and symmetric -- Sensation: Intact and symmetric Psychiatric:  -- Mood and affect WNL  Labs:  CBC Latest Ref Rng & Units 09/27/2017 08/08/2016 11/02/2013  WBC 3.6 - 11.0 K/uL 8.4 5.6 11.4(H)  Hemoglobin 12.0 - 16.0 g/dL 16.1 09.6 10.6(L)  Hematocrit 35.0 - 47.0 % 37.5 36.7 31.1(L)  Platelets 150 - 440 K/uL 325 317.0 354   CMP Latest Ref Rng & Units 09/27/2017 08/08/2016 11/02/2013  Glucose 65 - 99 mg/dL 97 88 045(W)  BUN 6 - 20 mg/dL 13 13 8   Creatinine 0.44 - 1.00 mg/dL 0.98 1.19 1.47  Sodium 135 - 145 mmol/L 140 139 136  Potassium 3.5 - 5.1 mmol/L 3.5 3.6 3.9  Chloride 101 - 111 mmol/L 104 110 103  CO2  22 - 32 mmol/L 24 21 22   Calcium 8.9 - 10.3 mg/dL 9.3 8.7 8.9  Total Protein 6.5 - 8.1 g/dL 7.7 7.3 6.3  Total Bilirubin 0.3 - 1.2 mg/dL 0.9 0.4 8.2(N)  Alkaline Phos 38 - 126 U/L 49 48 113  AST 15 - 41 U/L 26 15 19   ALT 14 - 54 U/L 18 12 13    Imaging studies:  Limited RUQ Abdominal Ultrasound (09/27/2017) Gallbladder is well distended with multiple gallstones within.  No pericholecystic fluid or gallbladder wall thickening is seen. Common bile duct diameter measures 4 mm.  Assessment/Plan: (ICD-10's: K62.20) 41 y.o. female with symptomatic cholelithiasis, complicated by pertinent comorbidities including obesity (BMI >32), asthma, degenerative spine disease (osteoarthritis), and major depression disorder.   - prefer vegetables, fruits, and grains  - minimize/avoid more fatty foods (meats, cheeses/dairy, and fried foods)  - pain control prn with Tylenol, NSAIDs, or fentanyl/Tramadol preferred > morphine analogs  - close outpatient surgical follow-up and anticipate elective laparoscopic cholecystectomy  - signs/symptoms for which to call office or return to ED discussed with patient  - patient and her husband express agreement and understanding  - continue medical management of comorbidities  All of the above findings and recommendations were discussed with the patient and her family, and all of patient's and her family's questions were answered to their expressed satisfaction.  Thank you for the opportunity to participate in this patient's care.   -- Scherrie Gerlach Earlene Plater, MD, RPVI [place]: [facility], [facility] - Partnering for exceptional care. Office: [phone]

## 2017-09-27 NOTE — ED Notes (Signed)
No EKG order in triage per Dr. Alphonzo LemmingsMcShane.

## 2017-09-28 DIAGNOSIS — K8010 Calculus of gallbladder with chronic cholecystitis without obstruction: Secondary | ICD-10-CM | POA: Insufficient documentation

## 2017-10-01 ENCOUNTER — Encounter: Payer: Self-pay | Admitting: Podiatry

## 2017-10-01 ENCOUNTER — Ambulatory Visit: Payer: BC Managed Care – PPO | Admitting: Podiatry

## 2017-10-01 DIAGNOSIS — M7751 Other enthesopathy of right foot: Secondary | ICD-10-CM | POA: Diagnosis not present

## 2017-10-01 MED ORDER — TRIAMCINOLONE ACETONIDE 10 MG/ML IJ SUSP
10.0000 mg | Freq: Once | INTRAMUSCULAR | Status: AC
  Administered 2017-10-01: 10 mg

## 2017-10-03 NOTE — Progress Notes (Signed)
Subjective:    Patient ID: Carrie KoyanagiLaura [name], female   DOB: 41 y.o.   MRN: [id number]   HPI patient states still having a lot of pain in the big toe joint right and stated that it seemed to get better with a shoe and now it's hurting me again    ROS      Objective:  Physical Exam neurovascular status intact with patient's first MPJ right still inflamed with pain still present but improved from previous     Assessment:    Inflammatory capsulitis first MPJ right improved but present     Plan:    H&P condition reviewed and careful injection administered around the first MPJ 3 mg Kenalog 5 mg Xylocaine and advised on continued immobilization with range of motion exercises and in rigid bottom shoes. Reappoint if symptoms persist and may ultimately require exploration

## 2017-10-06 ENCOUNTER — Other Ambulatory Visit: Payer: Self-pay

## 2017-10-06 DIAGNOSIS — M654 Radial styloid tenosynovitis [de Quervain]: Secondary | ICD-10-CM | POA: Insufficient documentation

## 2017-10-06 DIAGNOSIS — G5600 Carpal tunnel syndrome, unspecified upper limb: Secondary | ICD-10-CM | POA: Insufficient documentation

## 2017-10-09 ENCOUNTER — Ambulatory Visit: Payer: BC Managed Care – PPO | Admitting: Surgery

## 2017-10-09 ENCOUNTER — Encounter: Payer: Self-pay | Admitting: Surgery

## 2017-10-09 VITALS — BP 121/84 | HR 87 | Temp 98.4°F | Ht 63.0 in | Wt 181.8 lb

## 2017-10-09 DIAGNOSIS — K8020 Calculus of gallbladder without cholecystitis without obstruction: Secondary | ICD-10-CM

## 2017-10-09 NOTE — Patient Instructions (Addendum)
You have requested to have your gallbladder removed. This will be done on 10/24/17 at [facility] with Dr. Excell Seltzerooper.  You will most likely be out of work 1-2 weeks for this surgery. You will return after your post-op appointment with a lifting restriction for approximately 4 more weeks.  You will be able to eat anything you would like to following surgery. But, start by eating a bland diet and advance this as tolerated. The Gallbladder diet is below, please go as closely by this diet as possible prior to surgery to avoid any further attacks.  Please see the (blue)pre-care form that you have been given today. If you have any questions, please call our office.  Laparoscopic Cholecystectomy Laparoscopic cholecystectomy is surgery to remove the gallbladder. The gallbladder is located in the upper right part of the abdomen, behind the liver. It is a storage sac for bile, which is produced in the liver. Bile aids in the digestion and absorption of fats. Cholecystectomy is often done for inflammation of the gallbladder (cholecystitis). This condition is usually caused by a buildup of gallstones (cholelithiasis) in the gallbladder. Gallstones can block the flow of bile, and that can result in inflammation and pain. In severe cases, emergency surgery may be required. If emergency surgery is not required, you will have time to prepare for the procedure. Laparoscopic surgery is an alternative to open surgery. Laparoscopic surgery has a shorter recovery time. Your common bile duct may also need to be examined during the procedure. If stones are found in the common bile duct, they may be removed. LET [facility] KNOW ABOUT:  Any allergies you have.  All medicines you are taking, including vitamins, herbs, eye drops, creams, and over-the-counter medicines.  Previous problems you or members of your family have had with the use of anesthetics.  Any blood disorders you have.  Previous surgeries  you have had.    Any medical conditions you have. RISKS AND COMPLICATIONS Generally, this is a safe procedure. However, problems may occur, including:  Infection.  Bleeding.  Allergic reactions to medicines.  Damage to other structures or organs.  A stone remaining in the common bile duct.  A bile leak from the cyst duct that is clipped when your gallbladder is removed.  The need to convert to open surgery, which requires a larger incision in the abdomen. This may be necessary if your surgeon thinks that it is not safe to continue with a laparoscopic procedure. BEFORE THE PROCEDURE  Ask your health care provider about:  Changing or stopping your regular medicines. This is especially important if you are taking diabetes medicines or blood thinners.  Taking medicines such as aspirin and ibuprofen. These medicines can thin your blood. Do not take these medicines before your procedure if your health care provider instructs you not to.  Follow instructions from your health care provider about eating or drinking restrictions.  Let your health care provider know if you develop a cold or an infection before surgery.  Plan to have someone take you home after the procedure.  Ask your health care provider how your surgical site will be marked or identified.  You may be given antibiotic medicine to help prevent infection. PROCEDURE  To reduce your risk of infection:  Your health care team will wash or sanitize their hands.  Your skin will be washed with soap.  An IV tube may be inserted into one of your veins.  You will be given a medicine to make  you fall asleep (general anesthetic).  A breathing tube will be placed in your mouth.  The surgeon will make several small cuts (incisions) in your abdomen.  A thin, lighted tube (laparoscope) that has a tiny camera on the end will be inserted through one of the small incisions. The camera on the laparoscope will send a picture to  a TV screen (monitor) in the operating room. This will give the surgeon a good view inside your abdomen.  A gas will be pumped into your abdomen. This will expand your abdomen to give the surgeon more room to perform the surgery.  Other tools that are needed for the procedure will be inserted through the other incisions. The gallbladder will be removed through one of the incisions.  After your gallbladder has been removed, the incisions will be closed with stitches (sutures), staples, or skin glue.  Your incisions may be covered with a bandage (dressing). The procedure may vary among health care providers and hospitals. AFTER THE PROCEDURE  Your blood pressure, heart rate, breathing rate, and blood oxygen level will be monitored often until the medicines you were given have worn off.  You will be given medicines as needed to control your pain.   This information is not intended to replace advice given to you by your health care provider. Make sure you discuss any questions you have with your health care provider.   Document Released: 10/28/2005 Document Revised: 07/19/2015 Document Reviewed: 06/09/2013 Elsevier Interactive Patient Education 2016 [place] Diet for Gallbladder Conditions A low-fat diet can be helpful if you have pancreatitis or a gallbladder condition. With these conditions, your pancreas and gallbladder have trouble digesting fats. A healthy eating plan with less fat will help rest your pancreas and gallbladder and reduce your symptoms. WHAT DO I NEED TO KNOW ABOUT THIS DIET?  Eat a low-fat diet.  Reduce your fat intake to less than 20-30% of your total daily calories. This is less than 50-60 g of fat per day.  Remember that you need some fat in your diet. Ask your dietician what your daily goal should be.  Choose nonfat and low-fat healthy foods. Look for the words "nonfat," "low fat," or "fat free."  As a guide, look on the label and choose foods  with less than 3 g of fat per serving. Eat only one serving.  Avoid alcohol.  Do not smoke. If you need help quitting, talk with your health care provider.  Eat small frequent meals instead of three large heavy meals. WHAT FOODS CAN I EAT? Grains Include healthy grains and starches such as potatoes, wheat bread, fiber-rich cereal, and brown rice. Choose whole grain options whenever possible. In adults, whole grains should account for 45-65% of your daily calories.  Fruits and Vegetables Eat plenty of fruits and vegetables. Fresh fruits and vegetables add fiber to your diet. Meats and Other Protein Sources Eat lean meat such as chicken and pork. Trim any fat off of meat before cooking it. Eggs, fish, and beans are other sources of protein. In adults, these foods should account for 10-35% of your daily calories. Dairy Choose low-fat milk and dairy options. Dairy includes fat and protein, as well as calcium.  Fats and Oils Limit high-fat foods such as fried foods, sweets, baked goods, sugary drinks.  Other Creamy sauces and condiments, such as mayonnaise, can add extra fat. Think about whether or not you need to use them, or use smaller amounts or low fat options.  WHAT FOODS ARE NOT RECOMMENDED?  High fat foods, such as:  [employer].  Ice cream.  [place] toast.  Sweet rolls.  Pizza.  Cheese bread.  Foods covered with batter, butter, creamy sauces, or cheese.  Fried foods.  Sugary drinks and desserts.  Foods that cause gas or bloating   This information is not intended to replace advice given to you by your health care provider. Make sure you discuss any questions you have with your health care provider.   Document Released: 11/02/2013 Document Reviewed: 11/02/2013 Elsevier Interactive Patient Education [employer].

## 2017-10-09 NOTE — Progress Notes (Signed)
Carrie [name] is an 41 y.o. female.   Chief Complaint: biliary colic HPI: This patient was in the emergency room and the consult was requested by the emergency room physician.  She had signs of classic biliary colic.  Workup showed gallstones with normal liver function test.  Patient describes a single episode of right upper quadrant pain with no back pain.  She had nausea but no emesis.  Her attack lasted approximately 12 hours.  She has had minor pain episodes since being in the emergency room with some nausea but no emesis.  She is able to eat has modified her diet slightly to avoid fatty foods.  She has no jaundice or acholic stools no fevers or chills.  Family history includes her both parents having gallbladder problems.  Past Medical History:  Diagnosis Date  . Abnormal Pap smear   . Allergy   . 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 Asthma    exercised induced  . Depression   . Headache(784.0)   . Mononucleosis 1998  . PONV (postoperative nausea and vomiting)     Past Surgical History:  Procedure Laterality Date  . CARPAL TUNNEL RELEASE Bilateral 2016  . CESAREAN SECTION N/A 10/30/2013   Procedure: CESAREAN SECTION;  Surgeon: Meriel Picaichard M Holland, MD;  Location: [facility];  Service: Obstetrics;  Laterality: N/A;  PRIMARY  . COLPOSCOPY W/ BIOPSY / CURETTAGE    . DE QUERVAIN'S RELEASE Left 2016  . DILATION AND CURETTAGE OF UTERUS    . FOOT FRACTURE SURGERY Left   . HAND SURGERY Left    Fracture repair  . IVF      Family History  Problem Relation Age of Onset  . Arthritis Mother   . Diverticulitis Mother   . Thyroid disease Mother   . GER disease Mother   . Migraines Mother   . Arthritis Father   . Hypertension Father   . Hyperlipidemia Father   . Thyroid disease Father   . Diabetes Maternal Grandmother   . Heart attack Maternal Grandmother   . Alzheimer's disease Maternal Grandmother   . Heart disease Paternal Grandfather   . Heart attack Paternal Grandfather   . Anemia Sister   .  Thyroid disease Sister   . Healthy Brother   . Emphysema Maternal Grandfather   . Breast cancer Paternal Grandmother   . Thyroid disease Sister   . Bipolar disorder Sister   . Arrhythmia Sister        Ventricular Tachycardia   Social History:  reports that  has never smoked. she has never used smokeless tobacco. She reports that she does not drink alcohol or use drugs.  Allergies: No Known Allergies   (Not in a hospital admission)   Review of Systems:   Review of Systems  Constitutional: Negative.   HENT: Negative.   Eyes: Negative.   Respiratory: Negative.   Cardiovascular: Negative.   Gastrointestinal: Positive for abdominal pain, heartburn and nausea. Negative for blood in stool, constipation, diarrhea, melena and vomiting.  Genitourinary: Negative.   Musculoskeletal: Negative.   Skin: Negative.   Neurological: Negative.   Endo/Heme/Allergies: Negative.   Psychiatric/Behavioral: Negative.     Physical Exam:  Physical Exam  Constitutional: She is oriented to person, place, and time and well-developed, well-nourished, and in no distress. No distress.  HENT:  Head: Normocephalic and atraumatic.  Eyes: Pupils are equal, round, and reactive to light. Right eye exhibits no discharge. Left eye exhibits no discharge. No scleral icterus.  Neck: Normal range  of motion. No JVD present.  Cardiovascular: Normal rate, regular rhythm and normal heart sounds.  Pulmonary/Chest: Effort normal and breath sounds normal. No respiratory distress. She has no wheezes. She has no rales.  Abdominal: Soft. She exhibits no distension. There is no tenderness. There is no rebound and no guarding.  Infraumbilical scar no hernia  Musculoskeletal: Normal range of motion. She exhibits no edema or tenderness.  Lymphadenopathy:    She has no cervical adenopathy.  Neurological: She is alert and oriented to person, place, and time.  Skin: Skin is warm and dry. No rash noted. She is not diaphoretic.  No erythema.  Psychiatric: Mood and affect normal.  Vitals reviewed.   Blood pressure 121/84, pulse 87, temperature 98.4 F (36.9 C), temperature source Oral, height 5\' 3"  (1.6 m), weight 181 lb 12.8 oz (82.5 kg), last menstrual period 09/25/2017, unknown if currently breastfeeding.    No results found for this or any previous visit (from the past 48 hour(s)). No results found.   Assessment/Plan Classic symptoms of biliary colic with gallstones and normal liver function tests.  These studies done in the emergency room were personally reviewed.  Because of the patient's episodic pain in the workup showing gallstones I would recommend laparoscopic cholecystectomy for control of her symptoms.  Rationale for surgery was discussed the options of observation reviewed the risks of bleeding infection recurrence of symptoms failure to resolve all her symptoms especially with her reflux disease as well as the risks of bowel injury or bile duct injury requiring additional surgery or ERCP were all reviewed with her she understood and agreed to proceed  Lattie Hawichard E Sherilyn Windhorst, MD, FACS

## 2017-10-10 ENCOUNTER — Telehealth: Payer: BC Managed Care – PPO | Admitting: Family

## 2017-10-10 DIAGNOSIS — J329 Chronic sinusitis, unspecified: Secondary | ICD-10-CM

## 2017-10-10 DIAGNOSIS — B9689 Other specified bacterial agents as the cause of diseases classified elsewhere: Secondary | ICD-10-CM

## 2017-10-10 MED ORDER — BENZONATATE 100 MG PO CAPS: 100.0000 mg | ORAL_CAPSULE | Freq: Three times a day (TID) | ORAL | 0 refills | Status: DC | PRN

## 2017-10-10 MED ORDER — AMOXICILLIN-POT CLAVULANATE 875-125 MG PO TABS: 1.0000 | ORAL_TABLET | Freq: Two times a day (BID) | ORAL | 0 refills | Status: AC

## 2017-10-10 MED ORDER — PREDNISONE 5 MG PO TABS: 5.0000 mg | ORAL_TABLET | ORAL | 0 refills | Status: DC

## 2017-10-10 NOTE — Progress Notes (Signed)
Thank you for the details you included in the comment boxes. Those details are very helpful in determining the best course of treatment for you and help us to provide the best care. Please stop using the neti-pot as these cause infections and worsen current infections.  We are sorry that you are not feeling well.  Here is how we plan to help!  Based on what you have shared with me it looks like you have sinusitis.  Sinusitis is inflammation and infection in the sinus cavities of the head.  Based on your presentation I believe you most likely have Acute Bacterial Sinusitis.  This is an infection caused by bacteria and is treated with antibiotics. I have prescribed Augmentin 875mg /125mg  one tablet twice daily with food, for 7 days. You may use an oral decongestant such as Mucinex D or if you have glaucoma or high blood pressure use plain Mucinex. Saline nasal spray help and can safely be used as often as needed for congestion.  If you develop worsening sinus pain, fever or notice severe headache and vision changes, or if symptoms are not better after completion of antibiotic, please schedule an appointment with a health care provider.    For cough, I have prescribed a prednisone pack for inflammation and Tessalon Perles 100mg , take 1-2 every 8 hours as needed for cough.   Sinus infections are not as easily transmitted as other respiratory infection, however we still recommend that you avoid close contact with loved ones, especially the very young and elderly.  Remember to wash your hands thoroughly throughout the day as this is the number one way to prevent the spread of infection!  Home Care:  Only take medications as instructed by your medical team.  Complete the entire course of an antibiotic.  Do not take these medications with alcohol.  A steam or ultrasonic humidifier can help congestion.  You can place a towel over your head and breathe in the steam from hot water coming from a  faucet.  Avoid close contacts especially the very young and the elderly.  Cover your mouth when you cough or sneeze.  Always remember to wash your hands.  Get Help Right Away If:  You develop worsening fever or sinus pain.  You develop a severe head ache or visual changes.  Your symptoms persist after you have completed your treatment plan.  Make sure you  Understand these instructions.  Will watch your condition.  Will get help right away if you are not doing well or get worse.  Your e-visit answers were reviewed by a board certified advanced clinical practitioner to complete your personal care plan.  Depending on the condition, your plan could have included both over the counter or prescription medications.  If there is a problem please reply  once you have received a response from your provider.  Your safety is important to us.  If you have drug allergies check your prescription carefully.    You can use MyChart to ask questions about today's visit, request a non-urgent call back, or ask for a work or school excuse for 24 hours related to this e-Visit. If it has been greater than 24 hours you will need to follow up with your provider, or enter a new e-Visit to address those concerns.  You will get an e-mail in the next two days asking about your experience.  I hope that your e-visit has been valuable and will speed your recovery. Thank you for using e-visits.

## 2017-10-13 ENCOUNTER — Telehealth: Payer: Self-pay | Admitting: Surgery

## 2017-10-13 NOTE — Telephone Encounter (Signed)
Pt advised of pre op date/time and sx date. Sx: 10/24/17 with Dr Ludwig Clarksooper--Laparoscopic cholecystectomy.  Pre op: 10/16/17 @ 8:15am--Office interview.   Patient made aware to call [phone], between 1-3:00pm the day before surgery, to find out what time to arrive.

## 2017-10-16 ENCOUNTER — Encounter
Admission: RE | Admit: 2017-10-16 | Discharge: 2017-10-16 | Disposition: A | Discharge status: Home / Self Care | Payer: BC Managed Care – PPO | Source: Ambulatory Visit | Attending: Surgery | Admitting: Surgery

## 2017-10-16 ENCOUNTER — Other Ambulatory Visit: Payer: Self-pay

## 2017-10-16 DIAGNOSIS — Z01818 Encounter for other preprocedural examination: Secondary | ICD-10-CM | POA: Insufficient documentation

## 2017-10-16 HISTORY — DX: Gastro-esophageal reflux disease without esophagitis: K21.9

## 2017-10-16 NOTE — Patient Instructions (Signed)
Your procedure is scheduled on: Friday 10/24/17 Report to DAY SURGERY. 2ND FLOOR MEDICAL MALL ENTRANCE. To find out your arrival time please call [phone] between 1PM - 3PM on Thursday 10/23/17.  Remember: Instructions that are not followed completely may result in serious medical risk, up to and including death, or upon the discretion of your surgeon and anesthesiologist your surgery may need to be rescheduled.    __X__ 1. Do not eat anything after midnight the night before your    procedure.  No gum chewing or hard candies.  You may drink clear   liquids up to 2 hours before you are scheduled to arrive at the   hospital for your procedure. Do not drink clear liquids within 2   hours of scheduled arrival to the hospital as this may lead to your   procedure being delayed or rescheduled.       Clear liquids include:   Water or Apple juice without pulp   Clear carbohydrate beverage such as Clearfast or Gatorade   Black coffee or Clear Tea (no milk, no creamer, do not add anything   to the coffee or tea)    Diabetics should only drink water   __X__ 2. No Alcohol for 24 hours before or after surgery.   ____ 3. Bring all medications with you on the day of surgery if instructed.    __X__ 4. Notify your doctor if there is any change in your medical condition     (cold, fever, infections).             __X___5. No smoking within 24 hours of your surgery.     Do not wear jewelry, make-up, hairpins, clips or nail polish.  Do not wear lotions, powders, or perfumes.   Do not shave 48 hours prior to surgery. Men may shave face and neck.  Do not bring valuables to the hospital.    [facility] not [facility] for any belongings or valuables.               Contacts, dentures or bridgework may not be worn into surgery.  Leave your suitcase in the car. After surgery it may be brought to your room.  For patients admitted to the hospital, discharge time is determined by your                 treatment team.   Patients discharged the day of surgery will not be allowed to drive home.   Please read over the following fact sheets that you were given:   MRSA Information   __X__ Take these medicines the morning of surgery with A SIP OF WATER:    1. PREVACID  2.   3.   4.  5.  6.  ____ Fleet Enema (as directed)   __X__ Use CHG Soap/SAGE wipes as directed  __X__ Use inhalers on the day of surgery  ____ Stop metformin 2 days prior to surgery    ____ Take 1/2 of usual insulin dose the night before surgery and none on the morning of surgery.   ____ Stop Coumadin/Plavix/aspirin on   __X__ Stop Anti-inflammatories such as Advil, Aleve, Ibuprofen, Motrin, Naproxen, Naprosyn, Goodies,powder, or aspirin products.  OK to take Tylenol.   __X__ Stop supplements, Vitamin E, Fish Oil until after surgery.    ____ Bring C-Pap to the hospital.

## 2017-10-23 MED ORDER — CEFAZOLIN SODIUM-DEXTROSE 2-4 GM/100ML-% IV SOLN
2.0000 g | INTRAVENOUS | Status: AC
  Administered 2017-10-24: 2 g via INTRAVENOUS

## 2017-10-24 ENCOUNTER — Encounter
Admission: RE | Disposition: A | Discharge status: Home / Self Care | Payer: Self-pay | Source: Ambulatory Visit | Attending: Surgery

## 2017-10-24 ENCOUNTER — Ambulatory Visit
Admission: RE | Admit: 2017-10-24 | Discharge: 2017-10-24 | Disposition: A | Discharge status: Home / Self Care | Payer: BC Managed Care – PPO | Source: Ambulatory Visit | Attending: Surgery | Admitting: Surgery

## 2017-10-24 ENCOUNTER — Ambulatory Visit: Payer: BC Managed Care – PPO | Admitting: Anesthesiology

## 2017-10-24 ENCOUNTER — Encounter: Payer: Self-pay | Admitting: Surgery

## 2017-10-24 DIAGNOSIS — K8010 Calculus of gallbladder with chronic cholecystitis without obstruction: Secondary | ICD-10-CM | POA: Diagnosis not present

## 2017-10-24 DIAGNOSIS — Z79899 Other long term (current) drug therapy: Secondary | ICD-10-CM | POA: Diagnosis not present

## 2017-10-24 DIAGNOSIS — J45909 Unspecified asthma, uncomplicated: Secondary | ICD-10-CM | POA: Diagnosis not present

## 2017-10-24 DIAGNOSIS — K8064 Calculus of gallbladder and bile duct with chronic cholecystitis without obstruction: Secondary | ICD-10-CM | POA: Diagnosis not present

## 2017-10-24 HISTORY — PX: CHOLECYSTECTOMY: SHX55

## 2017-10-24 LAB — POCT PREGNANCY, URINE: Preg Test, Ur: NEGATIVE

## 2017-10-24 SURGERY — LAPAROSCOPIC CHOLECYSTECTOMY
Anesthesia: General | Site: Abdomen | Wound class: Clean Contaminated

## 2017-10-24 MED ORDER — BUPIVACAINE-EPINEPHRINE (PF) 0.25% -1:200000 IJ SOLN
INTRAMUSCULAR | Status: DC | PRN
  Administered 2017-10-24: 30 mL via PERINEURAL

## 2017-10-24 MED ORDER — HEPARIN SODIUM (PORCINE) 5000 UNIT/ML IJ SOLN
INTRAMUSCULAR | Status: AC
  Administered 2017-10-24: 5000 [IU] via SUBCUTANEOUS
  Filled 2017-10-24: qty 1

## 2017-10-24 MED ORDER — ACETAMINOPHEN 10 MG/ML IV SOLN
INTRAVENOUS | Status: AC
  Filled 2017-10-24: qty 100

## 2017-10-24 MED ORDER — SUCCINYLCHOLINE CHLORIDE 20 MG/ML IJ SOLN
INTRAMUSCULAR | Status: AC
  Filled 2017-10-24: qty 1

## 2017-10-24 MED ORDER — PROPOFOL 10 MG/ML IV BOLUS
INTRAVENOUS | Status: DC | PRN
  Administered 2017-10-24: 150 mg via INTRAVENOUS

## 2017-10-24 MED ORDER — ONDANSETRON HCL 4 MG/2ML IJ SOLN
INTRAMUSCULAR | Status: DC | PRN
  Administered 2017-10-24: 4 mg via INTRAVENOUS

## 2017-10-24 MED ORDER — FENTANYL CITRATE (PF) 100 MCG/2ML IJ SOLN: 25.0000 ug | INTRAMUSCULAR | Status: DC | PRN

## 2017-10-24 MED ORDER — CEFAZOLIN SODIUM-DEXTROSE 2-4 GM/100ML-% IV SOLN
INTRAVENOUS | Status: AC
  Filled 2017-10-24: qty 100

## 2017-10-24 MED ORDER — ONDANSETRON HCL 4 MG/2ML IJ SOLN: 4.0000 mg | Freq: Once | INTRAMUSCULAR | Status: DC | PRN

## 2017-10-24 MED ORDER — LIDOCAINE HCL (CARDIAC) 20 MG/ML IV SOLN
INTRAVENOUS | Status: DC | PRN
  Administered 2017-10-24: 80 mg via INTRAVENOUS

## 2017-10-24 MED ORDER — FAMOTIDINE 20 MG PO TABS
ORAL_TABLET | ORAL | Status: AC
  Filled 2017-10-24: qty 1

## 2017-10-24 MED ORDER — LACTATED RINGERS IV SOLN
INTRAVENOUS | Status: DC
  Administered 2017-10-24: 08:00:00 via INTRAVENOUS

## 2017-10-24 MED ORDER — SCOPOLAMINE 1 MG/3DAYS TD PT72
MEDICATED_PATCH | TRANSDERMAL | Status: AC
  Administered 2017-10-24: 1.5 mg via TRANSDERMAL
  Filled 2017-10-24: qty 1

## 2017-10-24 MED ORDER — SCOPOLAMINE 1 MG/3DAYS TD PT72
1.0000 | MEDICATED_PATCH | Freq: Once | TRANSDERMAL | Status: DC
  Administered 2017-10-24: 1.5 mg via TRANSDERMAL

## 2017-10-24 MED ORDER — SUGAMMADEX SODIUM 200 MG/2ML IV SOLN
INTRAVENOUS | Status: DC | PRN
  Administered 2017-10-24: 160 mg via INTRAVENOUS

## 2017-10-24 MED ORDER — MIDAZOLAM HCL 2 MG/2ML IJ SOLN
INTRAMUSCULAR | Status: AC
  Filled 2017-10-24: qty 2

## 2017-10-24 MED ORDER — CHLORHEXIDINE GLUCONATE CLOTH 2 % EX PADS: 6.0000 | MEDICATED_PAD | Freq: Once | CUTANEOUS | Status: DC

## 2017-10-24 MED ORDER — ROCURONIUM BROMIDE 50 MG/5ML IV SOLN
INTRAVENOUS | Status: AC
  Filled 2017-10-24: qty 1

## 2017-10-24 MED ORDER — LIDOCAINE HCL (PF) 2 % IJ SOLN
INTRAMUSCULAR | Status: AC
  Filled 2017-10-24: qty 10

## 2017-10-24 MED ORDER — ACETAMINOPHEN 10 MG/ML IV SOLN
INTRAVENOUS | Status: DC | PRN
  Administered 2017-10-24: 1000 mg via INTRAVENOUS

## 2017-10-24 MED ORDER — SUGAMMADEX SODIUM 200 MG/2ML IV SOLN
INTRAVENOUS | Status: AC
  Filled 2017-10-24: qty 2

## 2017-10-24 MED ORDER — DEXAMETHASONE SODIUM PHOSPHATE 10 MG/ML IJ SOLN
INTRAMUSCULAR | Status: DC | PRN
  Administered 2017-10-24: 8 mg via INTRAVENOUS

## 2017-10-24 MED ORDER — ROCURONIUM BROMIDE 100 MG/10ML IV SOLN
INTRAVENOUS | Status: DC | PRN
Start: 2017-10-24 — End: 2017-10-24
  Administered 2017-10-24: 10 mg via INTRAVENOUS
  Administered 2017-10-24: 30 mg via INTRAVENOUS
  Administered 2017-10-24: 5 mg via INTRAVENOUS

## 2017-10-24 MED ORDER — FENTANYL CITRATE (PF) 100 MCG/2ML IJ SOLN
INTRAMUSCULAR | Status: AC
  Filled 2017-10-24: qty 2

## 2017-10-24 MED ORDER — PROPOFOL 10 MG/ML IV BOLUS
INTRAVENOUS | Status: AC
  Filled 2017-10-24: qty 20

## 2017-10-24 MED ORDER — HYDROCODONE-ACETAMINOPHEN 5-325 MG PO TABS
1.0000 | ORAL_TABLET | ORAL | Status: DC | PRN
  Administered 2017-10-24: 1 via ORAL

## 2017-10-24 MED ORDER — HYDROCODONE-ACETAMINOPHEN 5-325 MG PO TABS
ORAL_TABLET | ORAL | Status: AC
  Filled 2017-10-24: qty 1

## 2017-10-24 MED ORDER — ONDANSETRON HCL 4 MG/2ML IJ SOLN
INTRAMUSCULAR | Status: AC
  Filled 2017-10-24: qty 2

## 2017-10-24 MED ORDER — PHENYLEPHRINE HCL 10 MG/ML IJ SOLN
INTRAMUSCULAR | Status: DC | PRN
  Administered 2017-10-24: 200 ug via INTRAVENOUS

## 2017-10-24 MED ORDER — HEPARIN SODIUM (PORCINE) 5000 UNIT/ML IJ SOLN
5000.0000 [IU] | Freq: Once | INTRAMUSCULAR | Status: AC
  Administered 2017-10-24: 5000 [IU] via SUBCUTANEOUS

## 2017-10-24 MED ORDER — BUPIVACAINE-EPINEPHRINE (PF) 0.25% -1:200000 IJ SOLN
INTRAMUSCULAR | Status: AC
  Filled 2017-10-24: qty 30

## 2017-10-24 MED ORDER — MIDAZOLAM HCL 2 MG/2ML IJ SOLN
INTRAMUSCULAR | Status: DC | PRN
  Administered 2017-10-24: 1 mg via INTRAVENOUS

## 2017-10-24 MED ORDER — HYDROCODONE-ACETAMINOPHEN 5-300 MG PO TABS: 1.0000 | ORAL_TABLET | ORAL | 0 refills | Status: DC | PRN

## 2017-10-24 MED ORDER — SUCCINYLCHOLINE CHLORIDE 20 MG/ML IJ SOLN
INTRAMUSCULAR | Status: DC | PRN
  Administered 2017-10-24: 100 mg via INTRAVENOUS

## 2017-10-24 MED ORDER — DEXAMETHASONE SODIUM PHOSPHATE 10 MG/ML IJ SOLN
INTRAMUSCULAR | Status: AC
  Filled 2017-10-24: qty 1

## 2017-10-24 MED ORDER — FENTANYL CITRATE (PF) 100 MCG/2ML IJ SOLN
INTRAMUSCULAR | Status: DC | PRN
  Administered 2017-10-24: 50 ug via INTRAVENOUS
  Administered 2017-10-24: 100 ug via INTRAVENOUS
  Administered 2017-10-24: 50 ug via INTRAVENOUS

## 2017-10-24 SURGICAL SUPPLY — 42 items
ADHESIVE MASTISOL STRL (MISCELLANEOUS) ×2 IMPLANT
APPLIER CLIP ROT 10 11.4 M/L (STAPLE) ×2
BLADE SURG SZ11 CARB STEEL (BLADE) ×2 IMPLANT
CANISTER SUCT 1200ML W/VALVE (MISCELLANEOUS) ×2 IMPLANT
CATH CHOLANGI 4FR 420404F (CATHETERS) IMPLANT
CHLORAPREP W/TINT 26ML (MISCELLANEOUS) ×2 IMPLANT
CLIP APPLIE ROT 10 11.4 M/L (STAPLE) ×1 IMPLANT
CONRAY 60ML FOR OR (MISCELLANEOUS) IMPLANT
DRAPE C-ARM XRAY 36X54 (DRAPES) IMPLANT
ELECT REM PT RETURN 9FT ADLT (ELECTROSURGICAL) ×2
ELECTRODE REM PT RTRN 9FT ADLT (ELECTROSURGICAL) ×1 IMPLANT
GAUZE SPONGE NON-WVN 2X2 STRL (MISCELLANEOUS) ×4 IMPLANT
GLOVE BIO SURGEON STRL SZ8 (GLOVE) ×2 IMPLANT
GOWN STRL REUS W/ TWL LRG LVL3 (GOWN DISPOSABLE) ×4 IMPLANT
GOWN STRL REUS W/TWL LRG LVL3 (GOWN DISPOSABLE) ×4
IRRIGATION STRYKERFLOW (MISCELLANEOUS) ×1 IMPLANT
IRRIGATOR STRYKERFLOW (MISCELLANEOUS) ×2
IV CATH ANGIO 12GX3 LT BLUE (NEEDLE) IMPLANT
IV NS 1000ML (IV SOLUTION) ×1
IV NS 1000ML BAXH (IV SOLUTION) ×1 IMPLANT
JACKSON PRATT 10 (INSTRUMENTS) IMPLANT
KIT RM TURNOVER STRD PROC AR (KITS) ×2 IMPLANT
LABEL OR SOLS (LABEL) IMPLANT
NEEDLE HYPO 22GX1.5 SAFETY (NEEDLE) ×2 IMPLANT
NEEDLE VERESS 14GA 120MM (NEEDLE) ×2 IMPLANT
NS IRRIG 500ML POUR BTL (IV SOLUTION) ×2 IMPLANT
PACK LAP CHOLECYSTECTOMY (MISCELLANEOUS) ×2 IMPLANT
POUCH SPECIMEN RETRIEVAL 10MM (ENDOMECHANICALS) ×2 IMPLANT
SCISSORS METZENBAUM CVD 33 (INSTRUMENTS) ×2 IMPLANT
SLEEVE ENDOPATH XCEL 5M (ENDOMECHANICALS) ×4 IMPLANT
SPONGE LAP 18X18 5 PK (GAUZE/BANDAGES/DRESSINGS) ×2 IMPLANT
SPONGE VERSALON 2X2 STRL (MISCELLANEOUS) ×4
SPONGE VERSALON 4X4 4PLY (MISCELLANEOUS) IMPLANT
STRIP CLOSURE SKIN 1/2X4 (GAUZE/BANDAGES/DRESSINGS) ×2 IMPLANT
SUT MNCRL 4-0 (SUTURE) ×1
SUT MNCRL 4-0 27XMFL (SUTURE) ×1
SUT VICRYL 0 AB UR-6 (SUTURE) ×2 IMPLANT
SUTURE MNCRL 4-0 27XMF (SUTURE) ×1 IMPLANT
SYR 20CC LL (SYRINGE) ×2 IMPLANT
TROCAR XCEL NON-BLD 11X100MML (ENDOMECHANICALS) ×2 IMPLANT
TROCAR XCEL NON-BLD 5MMX100MML (ENDOMECHANICALS) ×2 IMPLANT
TUBING INSUFFLATION (TUBING) ×2 IMPLANT

## 2017-10-24 NOTE — Anesthesia Procedure Notes (Signed)

## 2017-10-24 NOTE — Discharge Instructions (Addendum)
Remove dressing in 24 hours. °May shower in 24 hours. °Leave paper strips in place. °Resume all home medications. °Follow-up with Dr. Cooper in 10 days. ° °AMBULATORY SURGERY  °DISCHARGE INSTRUCTIONS ° ° °1) The drugs that you were given will stay in your system until tomorrow so for the next 24 hours you should not: ° °A) Drive an automobile °B) Make any legal decisions °C) Drink any alcoholic beverage ° ° °2) You may resume regular meals tomorrow.  Today it is better to start with liquids and gradually work up to solid foods. ° °You may eat anything you prefer, but it is better to start with liquids, then soup and crackers, and gradually work up to solid foods. ° ° °3) Please notify your doctor immediately if you have any unusual bleeding, trouble breathing, redness and pain at the surgery site, drainage, fever, or pain not relieved by medication. ° ° ° °4) Additional Instructions: ° ° ° ° ° ° ° °Please contact your physician with any problems or Same Day Surgery at [phone], Monday through Friday 6 am to 4 pm, or [place] [facility] Main number at [phone]. °

## 2017-10-24 NOTE — Anesthesia Preprocedure Evaluation (Addendum)
Anesthesia Evaluation  Patient identified by MRN, date of birth, ID band Patient awake    Reviewed: Allergy & Precautions, NPO status , Patient's Chart, lab work & pertinent test results  History of Anesthesia Complications (+) PONV and history of anesthetic complications  Airway Mallampati: II       Dental   Pulmonary asthma ,           Cardiovascular (-) hypertension(-) Past MI and (-) CHF (-) dysrhythmias (-) Valvular Problems/Murmurs     Neuro/Psych Depression    GI/Hepatic Neg liver ROS, GERD  Medicated,  Endo/Other  neg diabetes  Renal/GU negative Renal ROS     Musculoskeletal   Abdominal   Peds  Hematology   Anesthesia Other Findings   Reproductive/Obstetrics                            Anesthesia Physical Anesthesia Plan  ASA: II  Anesthesia Plan: General   Post-op Pain Management:    Induction: Intravenous  PONV Risk Score and Plan: 4 or greater and Midazolam, Dexamethasone, Ondansetron and Scopolamine patch - Pre-op  Airway Management Planned: LMA  Additional Equipment:   Intra-op Plan:   Post-operative Plan:   Informed Consent: I have reviewed the patients History and Physical, chart, labs and discussed the procedure including the risks, benefits and alternatives for the proposed anesthesia with the patient or authorized representative who has indicated his/her understanding and acceptance.     Plan Discussed with:   Anesthesia Plan Comments:         Anesthesia Quick Evaluation

## 2017-10-24 NOTE — Op Note (Signed)
Laparoscopic Cholecystectomy  Pre-operative Diagnosis: Biliary colic  Post-operative Diagnosis: Biliary colic  Procedure: Laparoscopic cholecystectomy  Surgeon: Adah Salvageichard E. Excell Seltzerooper, MD FACS  Anesthesia: Gen. with endotracheal tube  Assistant surgical tech  Procedure Details  The patient was seen again in the Holding Room. The benefits, complications, treatment options, and expected outcomes were discussed with the patient. The risks of bleeding, infection, recurrence of symptoms, failure to resolve symptoms, bile duct damage, bile duct leak, retained common bile duct stone, bowel injury, any of which could require further surgery and/or ERCP, stent, or papillotomy were reviewed with the patient. The likelihood of improving the patient's symptoms with return to their baseline status is good.  The patient and/or family concurred with the proposed plan, giving informed consent.  The patient was taken to Operating Room, identified as Carrie [name] and the procedure verified as Laparoscopic Cholecystectomy.  A Time Out was held and the above information confirmed.  Prior to the induction of general anesthesia, antibiotic prophylaxis was administered. VTE prophylaxis was in place. General endotracheal anesthesia was then administered and tolerated well. After the induction, the abdomen was prepped with Chloraprep and draped in the sterile fashion. The patient was positioned in the supine position.  Local anesthetic  was injected into the skin near the umbilicus and an incision made. The Veress needle was placed. Pneumoperitoneum was then created with CO2 and tolerated well without any adverse changes in the patient's vital signs. A 5mm port was placed in the periumbilical position and the abdominal cavity was explored.  Two 5-mm ports were placed in the right upper quadrant and a 12 mm epigastric port was placed all under direct vision. All skin incisions  were infiltrated with a local anesthetic agent  before making the incision and placing the trocars.   The patient was positioned  in reverse Trendelenburg, tilted slightly to the patient's left.  The gallbladder was identified, the fundus grasped and retracted cephalad. Adhesions were lysed bluntly. The infundibulum was grasped and retracted laterally, exposing the peritoneum overlying the triangle of Calot. This was then divided and exposed in a blunt fashion. A critical view of the cystic duct and cystic artery was obtained.  The cystic duct was clearly identified and bluntly dissected.   Cystic lymphatics were doubly clipped and divided.  This allowed for good visualization of the cystic duct as it entered the infundibulum of the gallbladder.  Large stones were noted at the infundibulum.  The cystic duct was doubly clipped and divided.  The cystic artery was doubly clipped and divided in 2 branches.  Small venous structures lateral to the gallbladder fossa were doubly clipped and divided as well  The gallbladder was taken from the gallbladder fossa in a retrograde fashion with the electrocautery. The gallbladder was removed and placed in an Endocatch bag.  Because of the large size of the multiple large gallstones the epigastric port site was enlarged.  The liver bed was irrigated and inspected. Hemostasis was achieved with the electrocautery. Copious irrigation was utilized and was repeatedly aspirated until clear.  The gallbladder and Endocatch sac were then removed through the epigastric port site.    Inspection of the right upper quadrant was performed. No bleeding, bile duct injury or leak, or bowel injury was noted. Pneumoperitoneum was released.  The epigastric port site was closed with figure-of-eight 0 Vicryl sutures. 4-0 subcuticular Monocryl was used to close the skin. Steristrips and Mastisol and sterile dressings were  applied.  The patient was then extubated and brought  to the recovery room in stable condition. Sponge, lap, and needle  counts were correct at closure and at the conclusion of the case.   Findings: Chronic cholecystitis with very large gallstones  Estimated Blood Loss: Nil         Drains: None         Specimens: Gallbladder           Complications: none               Caci Orren E. Excell Seltzerooper, MD, FACS

## 2017-10-24 NOTE — Anesthesia Postprocedure Evaluation (Signed)
Anesthesia Post Note  Patient: Carrie KoyanagiLaura [name]  Procedure(s) Performed: LAPAROSCOPIC CHOLECYSTECTOMY (N/A Abdomen)  Patient location during evaluation: PACU Anesthesia Type: General Level of consciousness: awake and alert Pain management: pain level controlled Vital Signs Assessment: post-procedure vital signs reviewed and stable Respiratory status: spontaneous breathing and respiratory function stable Cardiovascular status: stable Anesthetic complications: no     Last Vitals:  Vitals:   10/24/17 0956 10/24/17 0959  BP: (P) 118/74 118/74  Pulse:  (!) 106  Resp: (P) 16 17  Temp: (P) 36.7 C   SpO2:  100%    Last Pain:  Vitals:   10/24/17 0752  TempSrc: Tympanic  PainSc: 1                  Shamari Lofquist K

## 2017-10-24 NOTE — Anesthesia Post-op Follow-up Note (Signed)
Anesthesia QCDR form completed.        

## 2017-10-24 NOTE — Transfer of Care (Signed)
Immediate Anesthesia Transfer of Care Note  Patient: Carrie KoyanagiLaura [name]  Procedure(s) Performed: LAPAROSCOPIC CHOLECYSTECTOMY (N/A Abdomen)  Patient Location: PACU  Anesthesia Type:General  Level of Consciousness: sedated and responds to stimulation  Airway & Oxygen Therapy: Patient Spontanous Breathing and Patient connected to face mask oxygen  Post-op Assessment: Report given to RN and Post -op Vital signs reviewed and stable  Post vital signs: Reviewed and stable  Last Vitals:  Vitals:   10/24/17 0956 10/24/17 0959  BP: (P) 118/74 118/74  Pulse:  (!) 106  Resp: (P) 16 17  Temp: (P) 36.7 C   SpO2:  100%    Last Pain:  Vitals:   10/24/17 0752  TempSrc: Tympanic  PainSc: 1          Complications: No apparent anesthesia complications

## 2017-10-24 NOTE — Progress Notes (Signed)
Preoperative Review   Patient is met in the preoperative holding area. The history is reviewed in the chart and with the patient. I personally reviewed the options and rationale as well as the risks of this procedure that have been previously discussed with the patient. All questions asked by the patient and/or family were answered to their satisfaction.  Patient agrees to proceed with this procedure at this time.  Ahmani Prehn E Giavonna Pflum M.D. FACS  

## 2017-10-27 LAB — SURGICAL PATHOLOGY

## 2017-10-30 ENCOUNTER — Encounter: Payer: Self-pay | Admitting: Surgery

## 2017-10-30 ENCOUNTER — Ambulatory Visit (INDEPENDENT_AMBULATORY_CARE_PROVIDER_SITE_OTHER): Payer: BC Managed Care – PPO | Admitting: Surgery

## 2017-10-30 VITALS — BP 130/83 | HR 65 | Temp 98.3°F | Wt 175.0 lb

## 2017-10-30 DIAGNOSIS — K8020 Calculus of gallbladder without cholecystitis without obstruction: Secondary | ICD-10-CM

## 2017-10-30 NOTE — Progress Notes (Signed)
Outpatient postop visit  10/30/2017  Leandrew KoyanagiLaura Banks is an 41 y.o. female.    Procedure: Laparoscopic cholecystectomy  CC: No problems  HPI: This patient is status post laparoscopic cholecystectomy.  Patient is eating well having no problems with bowel movements. Medications reviewed.    Physical Exam:  LMP 10/23/2017     PE: No icterus no jaundice abdomen soft nontender wounds are clean with Steri-Strips    Assessment/Plan:  Status post laparoscopic cholecystectomy.  Pathology is reviewed and confirms diagnosis without malignancy.  Patient doing very well recommend follow-up on an as-needed basis.  Lattie Hawichard E Francella Barnett, MD, FACS

## 2017-10-30 NOTE — Patient Instructions (Signed)

## 2017-12-31 ENCOUNTER — Telehealth: Payer: BC Managed Care – PPO | Admitting: Family

## 2017-12-31 DIAGNOSIS — J019 Acute sinusitis, unspecified: Secondary | ICD-10-CM

## 2017-12-31 MED ORDER — AMOXICILLIN-POT CLAVULANATE 875-125 MG PO TABS: 1.0000 | ORAL_TABLET | Freq: Two times a day (BID) | ORAL | 0 refills | Status: DC

## 2017-12-31 NOTE — Progress Notes (Signed)

## 2018-02-09 ENCOUNTER — Encounter: Payer: Self-pay | Admitting: Internal Medicine

## 2018-02-09 ENCOUNTER — Ambulatory Visit: Payer: BC Managed Care – PPO | Admitting: Internal Medicine

## 2018-02-09 DIAGNOSIS — J0101 Acute recurrent maxillary sinusitis: Secondary | ICD-10-CM

## 2018-02-09 MED ORDER — LEVOFLOXACIN 500 MG PO TABS: 500.0000 mg | ORAL_TABLET | Freq: Every day | ORAL | 0 refills | Status: DC

## 2018-02-09 MED ORDER — PREDNISONE 10 MG PO TABS
ORAL_TABLET | ORAL | 0 refills | Status: DC
Start: 2018-02-09 — End: 2018-05-20

## 2018-02-09 NOTE — Progress Notes (Signed)
Pre-visit discussion using our clinic review tool. No additional management support is needed unless otherwise documented below in the visit note.  

## 2018-02-09 NOTE — Progress Notes (Signed)
Subjective:  Patient ID: Carrie KoyanagiLaura Stanislaw, female    DOB: 10/23/1976  Age: 42 y.o. MRN: [id number]  CC: The encounter diagnosis was Acute recurrent maxillary sinusitis.  HPI Carrie KoyanagiLaura Chatwin presents for recurrent sinusitis  Symptoms present for two weeks,  started with sinus pai and clear drainage.  Tuesday developed cojunctivitis and has gotten  Progressively worse.   Not  Much cough.  No chest pain    Was treated for sinusitis  via E Visit Feb 20 with augmentin and mucinex D . No prednisone.  She is not sure if her symptoms completely resolved before they recurred.  Developed flushing,  erythema and warmth over maxillary sinuses today,  (husband worried bout Fifth's disease)     Outpatient Medications Prior to Visit  Medication Sig Dispense Refill  . acetaminophen (TYLENOL) 500 MG tablet Take 1,000-1,500 mg by mouth every 6 (six) hours as needed for moderate pain or headache.     . albuterol (PROAIR HFA) 108 (90 Base) MCG/ACT inhaler Inhale 2 puffs into the lungs every 6 (six) hours as needed for wheezing or shortness of breath. 1 Inhaler 2  . CALCIUM PO Take 1 tablet by mouth daily.    . Cholecalciferol (VITAMIN D PO) Take 2 capsules by mouth daily.     . fluticasone (FLONASE) 50 MCG/ACT nasal spray Place 2 sprays into both nostrils daily. (Patient taking differently: Place 1 spray into both nostrils at bedtime. ) 16 g 6  . ibuprofen (ADVIL,MOTRIN) 200 MG tablet Take 800 mg by mouth every 6 (six) hours as needed for headache or moderate pain.    Marland Kitchen. lansoprazole (PREVACID) 15 MG capsule Take 15 mg by mouth daily.     Marland Kitchen. loratadine (CLARITIN) 10 MG tablet Take 10 mg by mouth daily as needed.     . magnesium oxide (MAG-OX) 400 (241.3 Mg) MG tablet TAKE 1 TABLET EVERY DAY (Patient taking differently: Take 400 mg by mouth once daily) 30 tablet 1  . Omega-3 Fatty Acids (FISH OIL BURP-LESS PO) Take 1 capsule by mouth daily.     . rizatriptan (MAXALT) 10 MG tablet Take 10 mg by mouth daily as needed for  migraine.     Marland Kitchen. VYVANSE 40 MG capsule Take 70 mg by mouth daily.   0  . amoxicillin-clavulanate (AUGMENTIN) 875-125 MG tablet Take 1 tablet by mouth 2 (two) times daily. (Patient not taking: Reported on 02/09/2018) 14 tablet 0   No facility-administered medications prior to visit.     Review of Systems;  Patient denies headache, fevers, malaise, unintentional weight loss, skin rash, eye pain, sinus congestion and sinus pain, sore throat, dysphagia,  hemoptysis , cough, dyspnea, wheezing, chest pain, palpitations, orthopnea, edema, abdominal pain, nausea, melena, diarrhea, constipation, flank pain, dysuria, hematuria, urinary  Frequency, nocturia, numbness, tingling, seizures,  Focal weakness, Loss of consciousness,  Tremor, insomnia, depression, anxiety, and suicidal ideation.      Objective:  BP 132/80 (BP Location: Left Arm, Patient Position: Sitting, Cuff Size: Normal)   Pulse 100   Temp 98.1 F (36.7 C) (Oral)   Resp 18   Ht 5\' 3"  (1.6 m)   Wt 181 lb (82.1 kg)   SpO2 98%   BMI 32.06 kg/m   BP Readings from Last 3 Encounters:  02/09/18 132/80  10/30/17 130/83  10/24/17 (!) 98/53    Wt Readings from Last 3 Encounters:  02/09/18 181 lb (82.1 kg)  10/30/17 175 lb (79.4 kg)  10/24/17 173 lb (78.5 kg)  General appearance: alert, cooperative and appears stated age Ears: normal R ight TM's, but left is bulging without erythema  and external ear canals are normal bilaterally  Face: bilateral blanching erythematous  rash over maxillary sinuses  Throat: lips, mucosa, and tongue normal; teeth and gums normal Neck: no adenopathy, no carotid bruit, supple, symmetrical, trachea midline and thyroid not enlarged, symmetric, no tenderness/mass/nodules Back: symmetric, no curvature. ROM normal. No CVA tenderness. Lungs: clear to auscultation bilaterally Heart: regular rate and rhythm, S1, S2 normal, no murmur, click, rub or gallop Abdomen: soft, non-tender; bowel sounds normal; no  masses,  no organomegaly Pulses: 2+ and symmetric Skin: Skin color, texture, turgor normal. No rashes or lesions Lymph nodes: Cervical, supraclavicular, and axillary nodes normal.  No results found for: HGBA1C  Lab Results  Component Value Date   CREATININE 0.74 09/27/2017   CREATININE 0.71 08/08/2016   CREATININE 0.61 11/02/2013    Lab Results  Component Value Date   WBC 8.4 09/27/2017   HGB 13.1 09/27/2017   HCT 37.5 09/27/2017   PLT 325 09/27/2017   GLUCOSE 97 09/27/2017   CHOL 196 08/08/2016   TRIG 143.0 08/08/2016   HDL 44.60 08/08/2016   LDLCALC 122 (H) 08/08/2016   ALT 18 09/27/2017   AST 26 09/27/2017   NA 140 09/27/2017   K 3.5 09/27/2017   CL 104 09/27/2017   CREATININE 0.74 09/27/2017   BUN 13 09/27/2017   CO2 24 09/27/2017   TSH 1.87 08/08/2016    No results found.  Assessment & Plan:   Problem List Items Addressed This Visit    Acute recurrent maxillary sinusitis    Levaquin x 7 days,  6 day prednisone taper,  Decongestants . Probiotic use x 3 weeks advised       Relevant Medications   levofloxacin (LEVAQUIN) 500 MG tablet   predniSONE (DELTASONE) 10 MG tablet     A total of 25 minutes of face to face time was spent with patient more than half of which was spent in counselling about the above mentioned conditions  and coordination of care    I am having Carrie [name] start on levofloxacin and predniSONE. I am also having her maintain her acetaminophen, lansoprazole, loratadine, magnesium oxide, rizatriptan, Cholecalciferol (VITAMIN D PO), VYVANSE, fluticasone, albuterol, Omega-3 Fatty Acids (FISH OIL BURP-LESS PO), CALCIUM PO, ibuprofen, and amoxicillin-clavulanate.  Meds ordered this encounter  Medications  . levofloxacin (LEVAQUIN) 500 MG tablet    Sig: Take 1 tablet (500 mg total) by mouth daily.    Dispense:  7 tablet    Refill:  0  . predniSONE (DELTASONE) 10 MG tablet    Sig: 6 tablets on Day 1 , then reduce by 1 tablet daily until gone      Dispense:  21 tablet    Refill:  0    There are no discontinued medications.  Follow-up: No follow-ups on file.   Sherlene Shams, MD

## 2018-02-09 NOTE — Patient Instructions (Addendum)
I am prescribing levaquin and a 6 day prednisone taper for your  sinusitis  Sudafed PE and/or Afrin for the congestion   Suspend the nasonex.  If allergy symptoms recur,  Use allegra, zyrtec or claritin  You can use Benadryl just  At night to help dry up the PND   Please take a probiotic ( Align, Floraque or Culturelle) while you are on the antibiotic for at least 3 weeks  to prevent a serious antibiotic associated diarrhea  Called clostirudium dificile colitis and a vaginal yeast infection    Consider adding a daily sinus irrigant Lloyd Huger(Neil Med's sinus rinse) during pollen season

## 2018-02-10 NOTE — Assessment & Plan Note (Addendum)
Levaquin x 7 days,  6 day prednisone taper,  Decongestants . Probiotic use x 3 weeks advised

## 2018-03-02 ENCOUNTER — Encounter: Payer: Self-pay | Admitting: Internal Medicine

## 2018-03-05 ENCOUNTER — Encounter: Payer: Self-pay | Admitting: Internal Medicine

## 2018-03-05 ENCOUNTER — Ambulatory Visit (INDEPENDENT_AMBULATORY_CARE_PROVIDER_SITE_OTHER): Payer: BC Managed Care – PPO | Admitting: Internal Medicine

## 2018-03-05 VITALS — BP 120/70 | HR 73 | Temp 98.2°F | Resp 18 | Wt 181.4 lb

## 2018-03-05 DIAGNOSIS — Z0000 Encounter for general adult medical examination without abnormal findings: Secondary | ICD-10-CM | POA: Diagnosis not present

## 2018-03-05 DIAGNOSIS — K219 Gastro-esophageal reflux disease without esophagitis: Secondary | ICD-10-CM

## 2018-03-05 DIAGNOSIS — G43019 Migraine without aura, intractable, without status migrainosus: Secondary | ICD-10-CM | POA: Diagnosis not present

## 2018-03-05 DIAGNOSIS — Z13220 Encounter for screening for lipoid disorders: Secondary | ICD-10-CM | POA: Diagnosis not present

## 2018-03-05 DIAGNOSIS — F419 Anxiety disorder, unspecified: Secondary | ICD-10-CM

## 2018-03-05 DIAGNOSIS — J0140 Acute pansinusitis, unspecified: Secondary | ICD-10-CM

## 2018-03-05 DIAGNOSIS — E7800 Pure hypercholesterolemia, unspecified: Secondary | ICD-10-CM | POA: Diagnosis not present

## 2018-03-05 DIAGNOSIS — J309 Allergic rhinitis, unspecified: Secondary | ICD-10-CM

## 2018-03-05 LAB — LIPID PANEL
CHOLESTEROL: 212 mg/dL — AB (ref 0–200)
HDL: 67.3 mg/dL (ref >39.00)
LDL CALC: 127 mg/dL — AB (ref 0–99)
NonHDL: 144.65
TRIGLYCERIDES: 86 mg/dL (ref 0.0–149.0)
Total CHOL/HDL Ratio: 3
VLDL: 17.2 mg/dL (ref 0.0–40.0)

## 2018-03-05 MED ORDER — AZELASTINE HCL 0.1 % NA SOLN: 1.0000 | Freq: Two times a day (BID) | NASAL | 2 refills | Status: DC

## 2018-03-05 MED ORDER — FLUTICASONE PROPIONATE 50 MCG/ACT NA SUSP: 1.0000 | Freq: Every day | NASAL | 6 refills | Status: DC

## 2018-03-05 NOTE — Progress Notes (Signed)
Patient ID: Carrie [name], female   DOB: [birth year], 42 y.o.   MRN: [id number]   Subjective:    Patient ID: Carrie [name], female    DOB: 11-14-1975, 42 y.o.   MRN: [id number]  HPI  Patient here for her physical exam.  She reports she is doing well.  Headaches doing better.  S/p SPG block.  Followed by neurology.  Has sinus congestion.  Is better.  On claritin, flonase and using saline.  No chest pain.  No sob.  No acid reflux.  No abdominal pain.  Bowels moving.  Anxiety better.  Seeing Dr Maryruth Bun.  Doing well on current medication regimen.  Now on vyvanse.     Past Medical History:  Diagnosis Date  . Abnormal Pap smear   . Allergy   . 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 Asthma    exercised induced  . Depression   . GERD (gastroesophageal reflux disease)   . Headache(784.0)   . Mononucleosis 1998  . PONV (postoperative nausea and vomiting)    takes awhile to wake up   Past Surgical History:  Procedure Laterality Date  . CARPAL TUNNEL RELEASE Bilateral 2016  . CESAREAN SECTION N/A 10/30/2013   Procedure: CESAREAN SECTION;  Surgeon: Meriel Pica, MD;  Location: [facility];  Service: Obstetrics;  Laterality: N/A;  PRIMARY  . CHOLECYSTECTOMY N/A 10/24/2017   Procedure: LAPAROSCOPIC CHOLECYSTECTOMY;  Surgeon: Lattie Haw, MD;  Location: [facility];  Service: General;  Laterality: N/A;  . COLPOSCOPY W/ BIOPSY / CURETTAGE    . DE QUERVAIN'S RELEASE Left 2016  . DILATION AND CURETTAGE OF UTERUS    . FOOT FRACTURE SURGERY Left   . HAND SURGERY Left    Fracture repair  . IVF     Family History  Problem Relation Age of Onset  . Arthritis Mother   . Diverticulitis Mother   . Thyroid disease Mother   . GER disease Mother   . Migraines Mother   . Arthritis Father   . Hypertension Father   . Hyperlipidemia Father   . Thyroid disease Father   . Diabetes Maternal Grandmother   . Heart attack Maternal Grandmother   . Alzheimer's disease Maternal Grandmother   . Heart disease Paternal Grandfather     . Heart attack Paternal Grandfather   . Anemia Sister   . Thyroid disease Sister   . Healthy Brother   . Emphysema Maternal Grandfather   . Breast cancer Paternal Grandmother   . Thyroid disease Sister   . Bipolar disorder Sister   . Arrhythmia Sister        Ventricular Tachycardia   Social History   Socioeconomic History  . Marital status: Married    Spouse name: Not on file  . Number of children: 1  . Years of education: Master's  . Highest education level: Not on file  Occupational History  . Occupation: Air cabin crew    Comment: Facilities manager  Social Needs  . Financial resource strain: Not on file  . Food insecurity:    Worry: Not on file    Inability: Not on file  . Transportation needs:    Medical: Not on file    Non-medical: Not on file  Tobacco Use  . Smoking status: Never Smoker  . Smokeless tobacco: Never Used  Substance and Sexual Activity  . Alcohol use: No  . Drug use: No  . Sexual activity: Not Currently    Comment: couple is afraid  to mess something up  Lifestyle  . Physical activity:    Days per week: Not on file    Minutes per session: Not on file  . Stress: Not on file  Relationships  . Social connections:    Talks on phone: Not on file    Gets together: Not on file    Attends religious service: Not on file    Active member of club or organization: Not on file    Attends meetings of clubs or organizations: Not on file    Relationship status: Not on file  Other Topics Concern  . Not on file  Social History Narrative  . Not on file    Outpatient Encounter Medications as of 03/05/2018  Medication Sig  . acetaminophen (TYLENOL) 500 MG tablet Take 1,000-1,500 mg by mouth every 6 (six) hours as needed for moderate pain or headache.   . albuterol (PROAIR HFA) 108 (90 Base) MCG/ACT inhaler Inhale 2 puffs into the lungs every 6 (six) hours as needed for wheezing or shortness of breath.  Marland Kitchen. azelastine (ASTELIN) 0.1 % nasal spray Place 1  spray into both nostrils 2 (two) times daily. Use in each nostril as directed  . CALCIUM PO Take 1 tablet by mouth daily.  . Cholecalciferol (VITAMIN D PO) Take 2 capsules by mouth daily.   . fluticasone (FLONASE) 50 MCG/ACT nasal spray Place 1 spray into both nostrils at bedtime.  Marland Kitchen. ibuprofen (ADVIL,MOTRIN) 200 MG tablet Take 800 mg by mouth every 6 (six) hours as needed for headache or moderate pain.  Marland Kitchen. lansoprazole (PREVACID) 15 MG capsule Take 15 mg by mouth daily.   Marland Kitchen. levofloxacin (LEVAQUIN) 500 MG tablet Take 1 tablet (500 mg total) by mouth daily.  Marland Kitchen. loratadine (CLARITIN) 10 MG tablet Take 10 mg by mouth daily as needed.   . magnesium oxide (MAG-OX) 400 (241.3 Mg) MG tablet TAKE 1 TABLET EVERY DAY (Patient taking differently: Take 400 mg by mouth once daily)  . Omega-3 Fatty Acids (FISH OIL BURP-LESS PO) Take 1 capsule by mouth daily.   . predniSONE (DELTASONE) 10 MG tablet 6 tablets on Day 1 , then reduce by 1 tablet daily until gone  . rizatriptan (MAXALT) 10 MG tablet Take 10 mg by mouth daily as needed for migraine.   Marland Kitchen. VYVANSE 70 MG capsule Take by mouth daily.  . [DISCONTINUED] amoxicillin-clavulanate (AUGMENTIN) 875-125 MG tablet Take 1 tablet by mouth 2 (two) times daily. (Patient not taking: Reported on 02/09/2018)  . [DISCONTINUED] fluticasone (FLONASE) 50 MCG/ACT nasal spray Place 2 sprays into both nostrils daily. (Patient taking differently: Place 1 spray into both nostrils at bedtime. )  . [DISCONTINUED] VYVANSE 40 MG capsule Take 70 mg by mouth daily.    No facility-administered encounter medications on file as of 03/05/2018.     Review of Systems  Constitutional: Negative for appetite change and unexpected weight change.  HENT: Positive for congestion. Negative for sinus pressure.   Eyes: Negative for pain and visual disturbance.  Respiratory: Negative for cough, chest tightness and shortness of breath.   Cardiovascular: Negative for chest pain, palpitations and leg  swelling.  Gastrointestinal: Negative for abdominal pain, diarrhea, nausea and vomiting.  Genitourinary: Negative for difficulty urinating and dysuria.  Musculoskeletal: Negative for joint swelling and myalgias.  Skin: Negative for color change and rash.  Neurological: Negative for dizziness, light-headedness and headaches.  Hematological: Negative for adenopathy. Does not bruise/bleed easily.  Psychiatric/Behavioral: Negative for agitation and dysphoric mood.       Objective:  Physical Exam  Constitutional: She is oriented to person, place, and time. She appears well-developed and well-nourished. No distress.  HENT:  Nose: Nose normal.  Mouth/Throat: Oropharynx is clear and moist.  Eyes: Right eye exhibits no discharge. Left eye exhibits no discharge. No scleral icterus.  Neck: Neck supple. No thyromegaly present.  Cardiovascular: Normal rate and regular rhythm.  Pulmonary/Chest: Breath sounds normal. No accessory muscle usage. No tachypnea. No respiratory distress. She has no decreased breath sounds. She has no wheezes. She has no rhonchi. Right breast exhibits no inverted nipple, no mass, no nipple discharge and no tenderness (no axillary adenopathy). Left breast exhibits no inverted nipple, no mass, no nipple discharge and no tenderness (no axilarry adenopathy).  Abdominal: Soft. Bowel sounds are normal. There is no tenderness.  Musculoskeletal: She exhibits no edema or tenderness.  Lymphadenopathy:    She has no cervical adenopathy.  Neurological: She is alert and oriented to person, place, and time.  Skin: Skin is warm. No rash noted. No erythema.  Psychiatric: She has a normal mood and affect. Her behavior is normal.    BP 120/70 (BP Location: Left Arm, Patient Position: Sitting, Cuff Size: Normal)   Pulse 73   Temp 98.2 F (36.8 C) (Oral)   Resp 18   Wt 181 lb 6.4 oz (82.3 kg)   SpO2 98%   BMI 32.13 kg/m  Wt Readings from Last 3 Encounters:  03/05/18 181 lb 6.4 oz  (82.3 kg)  02/09/18 181 lb (82.1 kg)  10/30/17 175 lb (79.4 kg)     Lab Results  Component Value Date   WBC 8.4 09/27/2017   HGB 13.1 09/27/2017   HCT 37.5 09/27/2017   PLT 325 09/27/2017   GLUCOSE 97 09/27/2017   CHOL 212 (H) 03/05/2018   TRIG 86.0 03/05/2018   HDL 67.30 03/05/2018   LDLCALC 127 (H) 03/05/2018   ALT 18 09/27/2017   AST 26 09/27/2017   NA 140 09/27/2017   K 3.5 09/27/2017   CL 104 09/27/2017   CREATININE 0.74 09/27/2017   BUN 13 09/27/2017   CO2 24 09/27/2017   TSH 1.87 08/08/2016       Assessment & Plan:   Problem List Items Addressed This Visit    Acid reflux    Controlled on current regimen.        Allergic rhinitis    Controlled on current regimen.        Anxiety    Doing well on current regimen.  Follow.        Common migraine with intractable migraine    S/p SPG block.  Doing better.  Follow.        Healthcare maintenance    Physical today 03/05/18.   Need mammogram results.  Up to date with gyn exams.  Check cholesterol.       Hypercholesteremia    Recheck lipid panel today.        Other Visit Diagnoses    Routine general medical examination at a health care facility    -  Primary   Acute pansinusitis, recurrence not specified       Relevant Medications   fluticasone (FLONASE) 50 MCG/ACT nasal spray   azelastine (ASTELIN) 0.1 % nasal spray   Screening cholesterol level       Relevant Orders   Lipid panel (Completed)       Dale Fried[place], MD

## 2018-03-06 ENCOUNTER — Encounter: Payer: Self-pay | Admitting: Internal Medicine

## 2018-03-07 ENCOUNTER — Encounter: Payer: Self-pay | Admitting: Internal Medicine

## 2018-03-07 DIAGNOSIS — Z0000 Encounter for general adult medical examination without abnormal findings: Secondary | ICD-10-CM | POA: Insufficient documentation

## 2018-03-07 DIAGNOSIS — F419 Anxiety disorder, unspecified: Secondary | ICD-10-CM | POA: Insufficient documentation

## 2018-03-07 NOTE — Assessment & Plan Note (Signed)
-   Recheck lipid panel today

## 2018-03-07 NOTE — Assessment & Plan Note (Signed)
Controlled on current regimen.   

## 2018-03-07 NOTE — Assessment & Plan Note (Signed)
Doing well on current regimen.  Follow.   

## 2018-03-07 NOTE — Assessment & Plan Note (Signed)
Physical today 03/05/18.   Need mammogram results.  Up to date with gyn exams.  Check cholesterol.

## 2018-03-07 NOTE — Assessment & Plan Note (Signed)
S/p SPG block.  Doing better.  Follow.

## 2018-03-24 ENCOUNTER — Ambulatory Visit: Payer: Self-pay

## 2018-03-24 NOTE — Telephone Encounter (Signed)
I do not mind working her in and I can see her tomorrow at 1:30 for work in.  I have to leave early today and am unable to work in today.  If still having increased heart rate (up to 160), etc - may need to be evaluated before tomorrow to confirm nothing more acute going on and then I can f/u with her after.

## 2018-03-24 NOTE — Telephone Encounter (Signed)
No palpitations at this time but earlier while sitting and having lunch, and started again while on phone now heart rate by fit bit 90. Denies chest pain, SOB when this happens was really bad on Saturday lasted 45 minutes heart rate 162. Advised patient when this happens she really needs to be evaluated while on going she needs to be seen in UC or ER. Patient requesting to see PCP as soon as possible.

## 2018-03-24 NOTE — Telephone Encounter (Signed)
Pt. Reports she noticed "some palpitations x 2 weeks, but they are getting more frequent." Reports Sunday she had a 45 minute episode where she felt short of breath. States she has checked her fit bit and it has shown pulses ranging from 70 to 120-160. States her sister has had similar symptoms and she was told she has PVC's and SVT. Denies dizziness or chest pains. Request to see Dr. Lorin Picket. Has cut back on caffeine.  Reason for Disposition . [1] Palpitations AND [2] no improvement after following Care Advice  Answer Assessment - Initial Assessment Questions 1. DESCRIPTION: "Please describe your heart rate or heart beat that you are having" (e.g., fast/slow, regular/irregular, skipped or extra beats, "palpitations")     Palpitations 2. ONSET: "When did it start?" (Minutes, hours or days)      2 weeks ago 3. DURATION: "How long does it last" (e.g., seconds, minutes, hours)     Saturday lasted 45 minutes 4. PATTERN "Does it come and go, or has it been constant since it started?"  "Does it get worse with exertion?"   "Are you feeling it now?"     Comes and goes 5. TAP: "Using your hand, can you tap out what you are feeling on a chair or table in front of you, so that I can hear?" (Note: not all patients can do this)       N/A 6. HEART RATE: "Can you tell me your heart rate?" "How many beats in 15 seconds?"  (Note: not all patients can do this)       Resting 70   But with palpitations 120-160's 7. RECURRENT SYMPTOM: "Have you ever had this before?" If so, ask: "When was the last time?" and "What happened that time?"      Over the past two weeks 8. CAUSE: "What do you think is causing the palpitations?"     Unsure 9. CARDIAC HISTORY: "Do you have any history of heart disease?" (e.g., heart attack, angina, bypass surgery, angioplasty, arrhythmia)      No 10. OTHER SYMPTOMS: "Do you have any other symptoms?" (e.g., dizziness, chest pain, sweating, difficulty breathing)       Shortness of  breath 11. PREGNANCY: "Is there any chance you are pregnant?" "When was your last menstrual period?"       No  Protocols used: HEART RATE AND HEARTBEAT QUESTIONS-A-AH

## 2018-03-24 NOTE — Telephone Encounter (Signed)
Patient scheduled and was advised if HR continues to palpate high or experiences SOB may need to be seen sooner.

## 2018-03-25 ENCOUNTER — Telehealth: Payer: Self-pay | Admitting: Cardiovascular Disease

## 2018-03-25 ENCOUNTER — Ambulatory Visit: Payer: BC Managed Care – PPO | Admitting: Internal Medicine

## 2018-03-25 ENCOUNTER — Ambulatory Visit (INDEPENDENT_AMBULATORY_CARE_PROVIDER_SITE_OTHER): Payer: BC Managed Care – PPO

## 2018-03-25 VITALS — BP 118/78 | HR 97 | Temp 98.3°F | Resp 18 | Wt 182.3 lb

## 2018-03-25 DIAGNOSIS — R002 Palpitations: Secondary | ICD-10-CM | POA: Diagnosis not present

## 2018-03-25 DIAGNOSIS — K219 Gastro-esophageal reflux disease without esophagitis: Secondary | ICD-10-CM

## 2018-03-25 DIAGNOSIS — E7800 Pure hypercholesterolemia, unspecified: Secondary | ICD-10-CM | POA: Diagnosis not present

## 2018-03-25 DIAGNOSIS — R000 Tachycardia, unspecified: Secondary | ICD-10-CM

## 2018-03-25 LAB — COMPREHENSIVE METABOLIC PANEL
ALBUMIN: 4.4 g/dL (ref 3.5–5.2)
ALT: 16 U/L (ref 0–35)
AST: 19 U/L (ref 0–37)
Alkaline Phosphatase: 42 U/L (ref 39–117)
BUN: 12 mg/dL (ref 6–23)
CHLORIDE: 102 meq/L (ref 96–112)
CO2: 27 mEq/L (ref 19–32)
Calcium: 9.7 mg/dL (ref 8.4–10.5)
Creatinine, Ser: 0.69 mg/dL (ref 0.40–1.20)
GFR: 99.55 mL/min (ref >60.00)
Glucose, Bld: 101 mg/dL — ABNORMAL HIGH (ref 70–99)
Potassium: 4.6 mEq/L (ref 3.5–5.1)
SODIUM: 136 meq/L (ref 135–145)
Total Bilirubin: 0.3 mg/dL (ref 0.2–1.2)
Total Protein: 7.5 g/dL (ref 6.0–8.3)

## 2018-03-25 LAB — CBC WITH DIFFERENTIAL/PLATELET
Basophils Absolute: 0 10*3/uL (ref 0.0–0.1)
Basophils Relative: 0.3 % (ref 0.0–3.0)
EOS PCT: 0.4 % (ref 0.0–5.0)
Eosinophils Absolute: 0 10*3/uL (ref 0.0–0.7)
HCT: 39.1 % (ref 36.0–46.0)
HEMOGLOBIN: 13.1 g/dL (ref 12.0–15.0)
LYMPHS ABS: 1.9 10*3/uL (ref 0.7–4.0)
Lymphocytes Relative: 24.8 % (ref 12.0–46.0)
MCHC: 33.4 g/dL (ref 30.0–36.0)
MCV: 88.3 fl (ref 78.0–100.0)
MONO ABS: 0.7 10*3/uL (ref 0.1–1.0)
Monocytes Relative: 8.6 % (ref 3.0–12.0)
NEUTROS ABS: 5.2 10*3/uL (ref 1.4–7.7)
NEUTROS PCT: 65.9 % (ref 43.0–77.0)
Platelets: 305 10*3/uL (ref 150.0–400.0)
RBC: 4.43 Mil/uL (ref 3.87–5.11)
RDW: 13.5 % (ref 11.5–15.5)
WBC: 7.8 10*3/uL (ref 4.0–10.5)

## 2018-03-25 LAB — TSH: TSH: 1.11 u[IU]/mL (ref 0.35–4.50)

## 2018-03-25 NOTE — Telephone Encounter (Signed)
Spoke with patient and reviewed that we would order a long term monitor for 2 weeks and then schedule her follow up after that. She verbalized understanding and will come by later today for monitor placement. She verbalized understanding with no further questions at this time.

## 2018-03-25 NOTE — Telephone Encounter (Signed)
Patient scheduled 6/13

## 2018-03-25 NOTE — Progress Notes (Signed)
Patient ID: Carrie [name], female   DOB: 1976-01-27, 42 y.o.   MRN: [id number]   Subjective:    Patient ID: Carrie [name], female    DOB: 02-28-1976, 42 y.o.   MRN: [id number]  HPI  Patient here as a work in with concerns regarding increased heart rate and palpitations.  She reports she has noticed occasional palpitations for years.  Recently treated with prednisone and noticed more.  Two weeks ago, became more frequent.  Randomly occurs.  Was lasting from 2-10 minutes.  No known triggers.  This past weekend had episode that lasted 45-60 minutes.  Heart beating fast.  Some sob.  Made her feel as if she had to cough.  She started decreasing her caffeine intake after this.  Decreased some the following two days.  Yesterday had intermittent short episodes.  Today, while pulling in to our parking lot, noticed some.  No chest pain.  Staying active.  No increased congestion.  No increased acid reflux.  No abdominal pain.  Bowels moving.  No increased stress.  Overall feels she is handling things relatively well.  States her sister was diagnosed with paroxysmal ventricular tachycardia and is on atenolol.     Past Medical History:  Diagnosis Date  . Abnormal Pap smear   . Allergy   . 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 Asthma    exercised induced  . Depression   . GERD (gastroesophageal reflux disease)   . Headache(784.0)   . Mononucleosis 1998  . PONV (postoperative nausea and vomiting)    takes awhile to wake up   Past Surgical History:  Procedure Laterality Date  . CARPAL TUNNEL RELEASE Bilateral 2016  . CESAREAN SECTION N/A 10/30/2013   Procedure: CESAREAN SECTION;  Surgeon: Margarette Asal, MD;  Location: [facility];  Service: Obstetrics;  Laterality: N/A;  PRIMARY  . CHOLECYSTECTOMY N/A 10/24/2017   Procedure: LAPAROSCOPIC CHOLECYSTECTOMY;  Surgeon: Florene Glen, MD;  Location: [facility];  Service: General;  Laterality: N/A;  . COLPOSCOPY W/ BIOPSY / CURETTAGE    . DE QUERVAIN'S RELEASE Left 2016  .  DILATION AND CURETTAGE OF UTERUS    . FOOT FRACTURE SURGERY Left   . HAND SURGERY Left    Fracture repair  . IVF     Family History  Problem Relation Age of Onset  . Arthritis Mother   . Diverticulitis Mother   . Thyroid disease Mother   . GER disease Mother   . Migraines Mother   . Arthritis Father   . Hypertension Father   . Hyperlipidemia Father   . Thyroid disease Father   . Diabetes Maternal Grandmother   . Heart attack Maternal Grandmother   . Alzheimer's disease Maternal Grandmother   . Heart disease Paternal Grandfather   . Heart attack Paternal Grandfather   . Anemia Sister   . Thyroid disease Sister   . Healthy Brother   . Emphysema Maternal Grandfather   . Breast cancer Paternal Grandmother   . Thyroid disease Sister   . Bipolar disorder Sister   . Arrhythmia Sister        Ventricular Tachycardia   Social History   Socioeconomic History  . Marital status: Married    Spouse name: Not on file  . Number of children: 1  . Years of education: Master's  . Highest education level: Not on file  Occupational History  . Occupation: Passenger transport manager    Comment: Transport planner  Social Needs  . Financial resource strain: Not  on file  . Food insecurity:    Worry: Not on file    Inability: Not on file  . Transportation needs:    Medical: Not on file    Non-medical: Not on file  Tobacco Use  . Smoking status: Never Smoker  . Smokeless tobacco: Never Used  Substance and Sexual Activity  . Alcohol use: No  . Drug use: No  . Sexual activity: Not Currently    Comment: couple is afraid  to mess something up  Lifestyle  . Physical activity:    Days per week: Not on file    Minutes per session: Not on file  . Stress: Not on file  Relationships  . Social connections:    Talks on phone: Not on file    Gets together: Not on file    Attends religious service: Not on file    Active member of club or organization: Not on file    Attends meetings of clubs or  organizations: Not on file    Relationship status: Not on file  Other Topics Concern  . Not on file  Social History Narrative  . Not on file    Outpatient Encounter Medications as of 03/25/2018  Medication Sig  . acetaminophen (TYLENOL) 500 MG tablet Take 1,000-1,500 mg by mouth every 6 (six) hours as needed for moderate pain or headache.   . albuterol (PROAIR HFA) 108 (90 Base) MCG/ACT inhaler Inhale 2 puffs into the lungs every 6 (six) hours as needed for wheezing or shortness of breath.  Marland Kitchen azelastine (ASTELIN) 0.1 % nasal spray Place 1 spray into both nostrils 2 (two) times daily. Use in each nostril as directed  . CALCIUM PO Take 1 tablet by mouth daily.  . Cholecalciferol (VITAMIN D PO) Take 2 capsules by mouth daily.   . fluticasone (FLONASE) 50 MCG/ACT nasal spray Place 1 spray into both nostrils at bedtime.  Marland Kitchen ibuprofen (ADVIL,MOTRIN) 200 MG tablet Take 800 mg by mouth every 6 (six) hours as needed for headache or moderate pain.  Marland Kitchen lansoprazole (PREVACID) 15 MG capsule Take 15 mg by mouth daily.   Marland Kitchen levofloxacin (LEVAQUIN) 500 MG tablet Take 1 tablet (500 mg total) by mouth daily.  Marland Kitchen loratadine (CLARITIN) 10 MG tablet Take 10 mg by mouth daily as needed.   . magnesium oxide (MAG-OX) 400 (241.3 Mg) MG tablet TAKE 1 TABLET EVERY DAY (Patient taking differently: Take 400 mg by mouth once daily)  . Omega-3 Fatty Acids (FISH OIL BURP-LESS PO) Take 1 capsule by mouth daily.   . predniSONE (DELTASONE) 10 MG tablet 6 tablets on Day 1 , then reduce by 1 tablet daily until gone  . rizatriptan (MAXALT) 10 MG tablet Take 10 mg by mouth daily as needed for migraine.   Marland Kitchen VYVANSE 70 MG capsule Take by mouth daily.   No facility-administered encounter medications on file as of 03/25/2018.     Review of Systems  Constitutional: Negative for appetite change and unexpected weight change.  HENT: Negative for congestion and sinus pressure.   Respiratory: Negative for chest tightness and wheezing.          Some previous cough with increased heart rate as outlined.    Cardiovascular: Positive for palpitations. Negative for chest pain and leg swelling.  Gastrointestinal: Negative for abdominal pain, diarrhea, nausea and vomiting.  Genitourinary: Negative for difficulty urinating and dysuria.  Musculoskeletal: Negative for joint swelling and myalgias.  Skin: Negative for color change and rash.  Neurological: Negative for  dizziness, light-headedness and headaches.  Psychiatric/Behavioral: Negative for agitation and dysphoric mood.       Objective:    Physical Exam  Constitutional: She appears well-developed and well-nourished. No distress.  HENT:  Nose: Nose normal.  Mouth/Throat: Oropharynx is clear and moist.  Neck: Neck supple. No thyromegaly present.  Cardiovascular: Normal rate and regular rhythm.  Pulmonary/Chest: Breath sounds normal. No respiratory distress. She has no wheezes.  Abdominal: Soft. Bowel sounds are normal. There is no tenderness.  Musculoskeletal: She exhibits no edema or tenderness.  Lymphadenopathy:    She has no cervical adenopathy.  Skin: No rash noted. No erythema.  Psychiatric: She has a normal mood and affect. Her behavior is normal.    BP 118/78 (BP Location: Left Arm, Patient Position: Sitting, Cuff Size: Normal)   Pulse 97   Temp 98.3 F (36.8 C) (Oral)   Resp 18   Wt 182 lb 4.8 oz (82.7 kg)   BMI 32.29 kg/m  Wt Readings from Last 3 Encounters:  03/25/18 182 lb 4.8 oz (82.7 kg)  03/05/18 181 lb 6.4 oz (82.3 kg)  02/09/18 181 lb (82.1 kg)     Lab Results  Component Value Date   WBC 7.8 03/25/2018   HGB 13.1 03/25/2018   HCT 39.1 03/25/2018   PLT 305.0 03/25/2018   GLUCOSE 101 (H) 03/25/2018   CHOL 212 (H) 03/05/2018   TRIG 86.0 03/05/2018   HDL 67.30 03/05/2018   LDLCALC 127 (H) 03/05/2018   ALT 16 03/25/2018   AST 19 03/25/2018   NA 136 03/25/2018   K 4.6 03/25/2018   CL 102 03/25/2018   CREATININE 0.69 03/25/2018   BUN 12  03/25/2018   CO2 27 03/25/2018   TSH 1.11 03/25/2018       Assessment & Plan:   Problem List Items Addressed This Visit    Acid reflux    Controlled on current regimen.  Follow.       Hypercholesteremia    Low cholesterol diet and exercise.  Follow lipid panel.       Tachycardia    Persistent intermittent episodes as outlined.  Worsening.  No clear trigger.  She has decreased caffeine intake.  No increased stress.  Has been on vyvanse.  Sister diagnosed with paroxysmal ventricular tachycardia.  On atenolol.  EKG - SR with no acute ischemic changes.  Check cbc, met c and tsh.  Also discussed with cardiology.  They will place monitor and schedule f/u for further w/up.  Pt comfortable with this plan.        Relevant Orders   CBC with Differential/Platelet (Completed)   Comprehensive metabolic panel (Completed)   TSH (Completed)    Other Visit Diagnoses    Palpitation    -  Primary   Relevant Orders   EKG 12-Lead (Completed)   CBC with Differential/Platelet (Completed)   Comprehensive metabolic panel (Completed)   TSH (Completed)       Einar Pheasant, MD

## 2018-03-25 NOTE — Telephone Encounter (Signed)
lmov

## 2018-03-26 ENCOUNTER — Encounter: Payer: Self-pay | Admitting: Internal Medicine

## 2018-04-05 ENCOUNTER — Encounter: Payer: Self-pay | Admitting: Internal Medicine

## 2018-04-05 NOTE — Assessment & Plan Note (Signed)
Persistent intermittent episodes as outlined.  Worsening.  No clear trigger.  She has decreased caffeine intake.  No increased stress.  Has been on vyvanse.  Sister diagnosed with paroxysmal ventricular tachycardia.  On atenolol.  EKG - SR with no acute ischemic changes.  Check cbc, met c and tsh.  Also discussed with cardiology.  They will place monitor and schedule f/u for further w/up.  Pt comfortable with this plan.

## 2018-04-05 NOTE — Assessment & Plan Note (Signed)
Controlled on current regimen.  Follow.  

## 2018-04-05 NOTE — Assessment & Plan Note (Signed)
Low cholesterol diet and exercise.  Follow lipid panel.   

## 2018-04-07 ENCOUNTER — Encounter: Payer: Self-pay | Admitting: Cardiovascular Disease

## 2018-04-23 ENCOUNTER — Ambulatory Visit: Payer: BC Managed Care – PPO | Admitting: Cardiovascular Disease

## 2018-04-23 ENCOUNTER — Encounter: Payer: Self-pay | Admitting: Cardiovascular Disease

## 2018-04-23 NOTE — Telephone Encounter (Signed)
Patient sent in mychart message wanting to know her monitor results because her appointment for today was rescheduled into July. Reviewed preliminary findings with her and advised that I would request provider review of report. She is very concerned about this and wanted some reassurance. Apologized for the delay in her results and that I would forward to provider. She verbalized understanding and had no further questions at this time.

## 2018-05-17 DIAGNOSIS — I493 Ventricular premature depolarization: Secondary | ICD-10-CM | POA: Insufficient documentation

## 2018-05-17 NOTE — Progress Notes (Signed)
Cardiology Office Note  Date:  05/20/2018   ID:  Carrie [name], DOB 08/11/1976, MRN [id number]  PCP:  Dale Camp Verde, MD   Chief Complaint  Patient presents with  . other    Ref by Dr. Lorin Picket for palpitations and discuss Zio event monitor. Meds reviewed by the pt. verbally. Pt. c/o PVC's & palpitations more often.     HPI:  Ms Carrie [name] is a 42 year old woman with past medical history of migraine Anxiety depression Referred by Dr. Lorin Picket for consultation of her palpitations  Event monitor recently worn reviewed with her in detail Rhythm strips pulled up in the office showing rare PVC  associated with her symptoms  no other significant tachycardia or arrhythmia   noticed occasional palpitations for years.   Seems to come and go, randomly in nature  No  triggers.  sometimes when clustered has some shortness of breath Decreased caffeine to see if this would help,May besome improvement  Work stress, works as a Runner, broadcasting/film/video Symptoms may be better this summer  EKG personally reviewed by myself on todays visit Shows NSR with no ST or T wave changes rate 77 bpm  Family hx Sister with ectopy  PMH:   has a past medical history of Abnormal Pap smear, Allergy, Arthritis, Asthma, Depression, GERD (gastroesophageal reflux disease), Headache(784.0), Mononucleosis (1998), and PONV (postoperative nausea and vomiting).  PSH:    Past Surgical History:  Procedure Laterality Date  . CARPAL TUNNEL RELEASE Bilateral 2016  . CESAREAN SECTION N/A 10/30/2013   Procedure: CESAREAN SECTION;  Surgeon: Meriel Pica, MD;  Location: [facility];  Service: Obstetrics;  Laterality: N/A;  PRIMARY  . CHOLECYSTECTOMY N/A 10/24/2017   Procedure: LAPAROSCOPIC CHOLECYSTECTOMY;  Surgeon: Lattie Haw, MD;  Location: [facility];  Service: General;  Laterality: N/A;  . COLPOSCOPY W/ BIOPSY / CURETTAGE    . DE QUERVAIN'S RELEASE Left 2016  . DILATION AND CURETTAGE OF UTERUS    . FOOT FRACTURE SURGERY Left   .  HAND SURGERY Left    Fracture repair  . IVF      Current Outpatient Medications  Medication Sig Dispense Refill  . acetaminophen (TYLENOL) 500 MG tablet Take 1,000-1,500 mg by mouth every 6 (six) hours as needed for moderate pain or headache.     . albuterol (PROAIR HFA) 108 (90 Base) MCG/ACT inhaler Inhale 2 puffs into the lungs every 6 (six) hours as needed for wheezing or shortness of breath. 1 Inhaler 2  . CALCIUM PO Take 1 tablet by mouth daily.    . Cholecalciferol (VITAMIN D PO) Take 2 capsules by mouth daily.     . fluticasone (FLONASE) 50 MCG/ACT nasal spray Place 1 spray into both nostrils at bedtime. 16 g 6  . ibuprofen (ADVIL,MOTRIN) 200 MG tablet Take 800 mg by mouth every 6 (six) hours as needed for headache or moderate pain.    Marland Kitchen lansoprazole (PREVACID) 15 MG capsule Take 15 mg by mouth daily.     Marland Kitchen loratadine (CLARITIN) 10 MG tablet Take 10 mg by mouth daily as needed.     . magnesium oxide (MAG-OX) 400 (241.3 Mg) MG tablet TAKE 1 TABLET EVERY DAY (Patient taking differently: Take 400 mg by mouth once daily) 30 tablet 1  . Omega-3 Fatty Acids (FISH OIL BURP-LESS PO) Take 1 capsule by mouth daily.     . rizatriptan (MAXALT) 10 MG tablet Take 10 mg by mouth daily as needed for migraine.     Marland Kitchen VYVANSE 70 MG capsule  Take by mouth daily.  0  . nebivolol (BYSTOLIC) 5 MG tablet Take 1 tablet (5 mg total) by mouth daily as needed. 30 tablet   . propranolol (INDERAL) 20 MG tablet Take 1 tablet (20 mg total) by mouth 3 (three) times daily as needed. 90 tablet 3   No current facility-administered medications for this visit.      Allergies:   Patient has no known allergies.   Social History:  The patient  reports that she has never smoked. She has never used smokeless tobacco. She reports that she does not drink alcohol or use drugs.   Family History:   family history includes Alzheimer's disease in her maternal grandmother; Anemia in her sister; Arrhythmia in her sister; Arthritis  in her father and mother; Bipolar disorder in her sister; Breast cancer in her paternal grandmother; Diabetes in her maternal grandmother; Diverticulitis in her mother; Emphysema in her maternal grandfather; GER disease in her mother; Healthy in her brother; Heart attack in her maternal grandmother and paternal grandfather; Heart disease in her paternal grandfather; Hyperlipidemia in her father; Hypertension in her father; Migraines in her mother; Thyroid disease in her father, mother, sister, and sister.    Review of Systems: Review of Systems  Constitutional: Negative.   Respiratory: Negative.   Cardiovascular: Positive for palpitations.  Gastrointestinal: Negative.   Musculoskeletal: Negative.   Neurological: Negative.   Psychiatric/Behavioral: Negative.   All other systems reviewed and are negative.    PHYSICAL EXAM: VS:  BP 110/74 (BP Location: Right Arm, Patient Position: Sitting, Cuff Size: Normal)   Pulse 77   Ht 5\' 3"  (1.6 m)   Wt 182 lb 8 oz (82.8 kg)   BMI 32.33 kg/m  , BMI Body mass index is 32.33 kg/m. GEN: Well nourished, well developed, in no acute distress  HEENT: normal  Neck: no JVD, carotid bruits, or masses Cardiac: RRR; no murmurs, rubs, or gallops,no edema  Respiratory:  clear to auscultation bilaterally, normal work of breathing GI: soft, nontender, nondistended, + BS MS: no deformity or atrophy  Skin: warm and dry, no rash Neuro:  Strength and sensation are intact Psych: euthymic mood, full affect    Recent Labs: 03/25/2018: ALT 16; BUN 12; Creatinine, Ser 0.69; Hemoglobin 13.1; Platelets 305.0; Potassium 4.6; Sodium 136; TSH 1.11    Lipid Panel Lab Results  Component Value Date   CHOL 212 (H) 03/05/2018   HDL 67.30 03/05/2018   LDLCALC 127 (H) 03/05/2018   TRIG 86.0 03/05/2018      Wt Readings from Last 3 Encounters:  05/20/18 182 lb 8 oz (82.8 kg)  03/25/18 182 lb 4.8 oz (82.7 kg)  03/05/18 181 lb 6.4 oz (82.3 kg)       ASSESSMENT  AND PLAN:  Tachycardia - Plan: EKG 12-Lead No significant tachycardia noted on 2 week monitor Having APCs and PVCs  Anxiety Managed by primary care Recommended regular walking program  Hypercholesteremia Recommended weight loss, walking program  PVC (premature ventricular contraction) - Plan: EKG 12-Lead Symptomatic APCs and PVCs Recommended she try propranolol short acting medication as needed for palpitations If she has frequent episodes on a regular basis could try long-acting beta blocker such as bystolic 5 mg daily . will be limited in the dose of beta blocker given her  low blood pressure  Disposition:   F/U as needed   Total encounter time more than 60 minutes  Greater than 50% was spent in counseling and coordination of care with the patient  Patient was seen in consultation for Dr. Lorin PicketScott and will be referred back to her office for ongoing care of the issues detailed above    Orders Placed This Encounter  Procedures  . EKG 12-Lead     Signed, Dossie Arbourim Julez Huseby, M.D., Ph.D. 05/20/2018  [facility], [place] [phone]

## 2018-05-19 IMAGING — US US ABDOMEN LIMITED
1 series · 14 of 25 positions shown · non-contrast
Comparison: None.

CLINICAL DATA: Upper abdominal pain for several hours

EXAM:
ULTRASOUND ABDOMEN LIMITED RIGHT UPPER QUADRANT

[Series 1: us abdomen limited · 0.22mm/px · 14 of 52 slices shown]
[im 1/52]
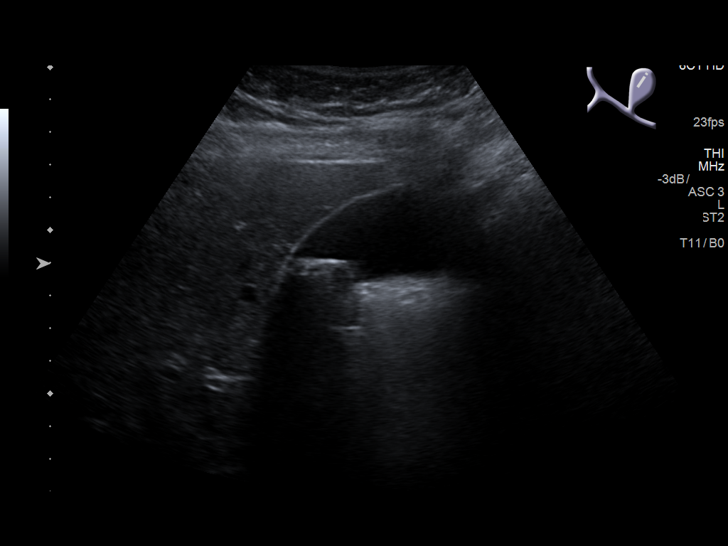
[im 5/52]
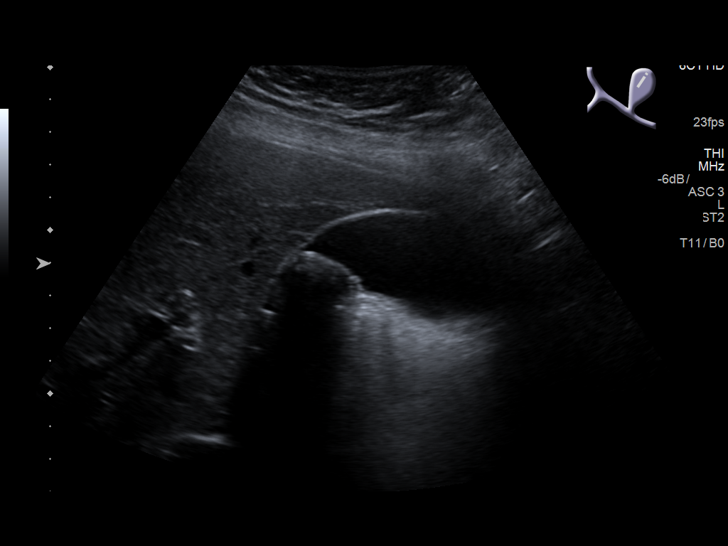
[im 9/52]
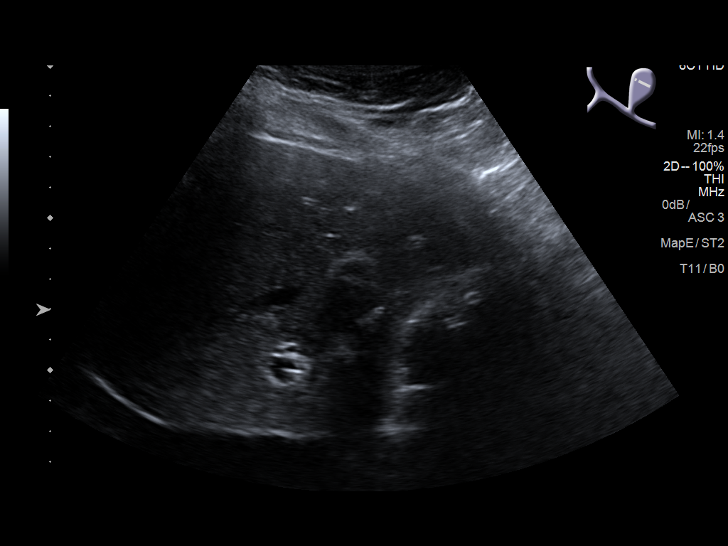
[im 13/52]
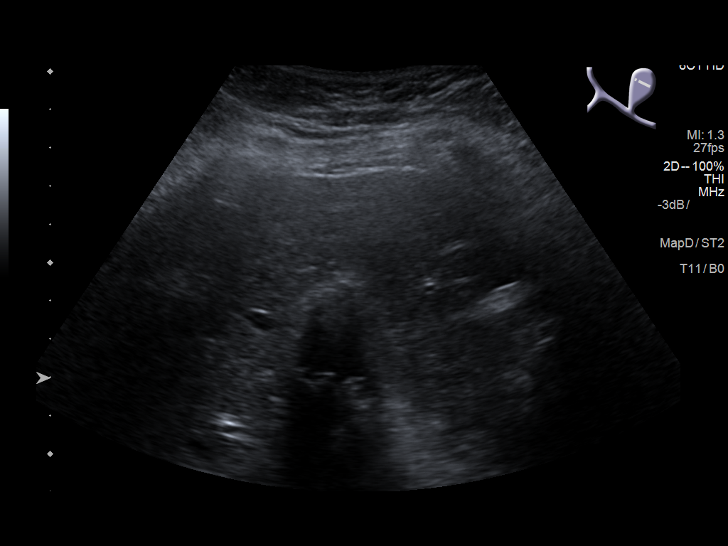
[im 18/52]
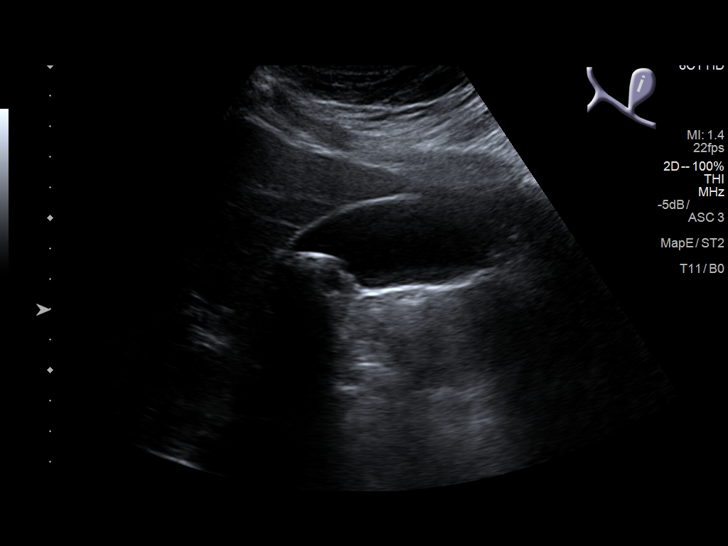
[im 20/52]
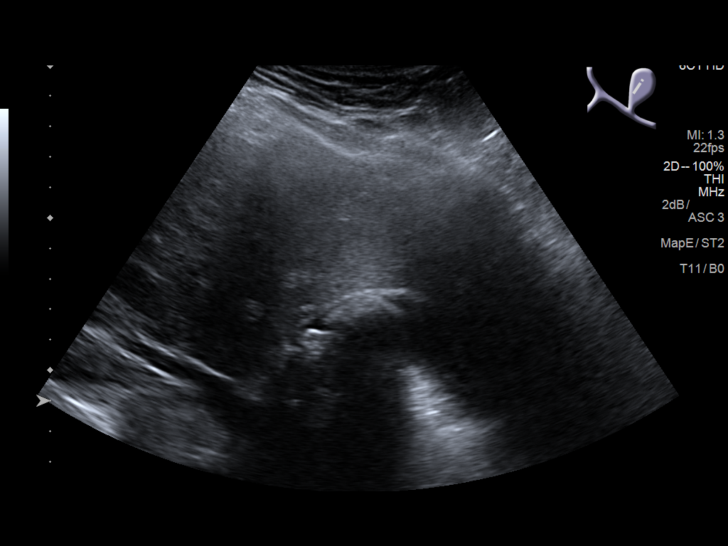
[im 24/52]
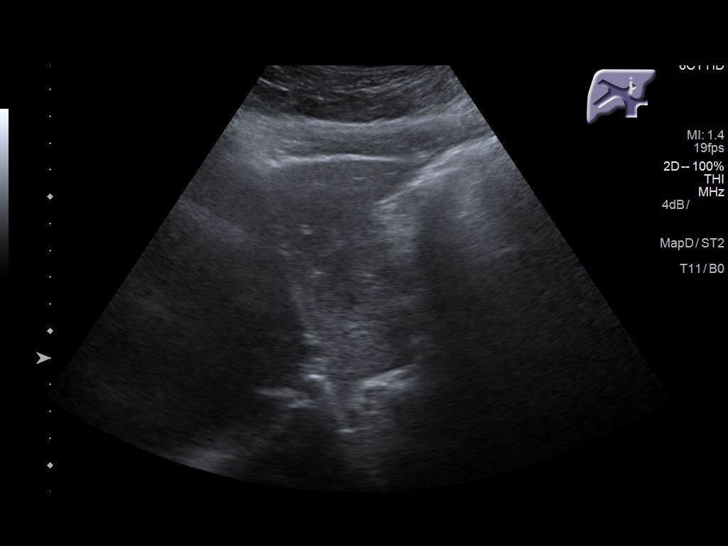
[im 28/52]
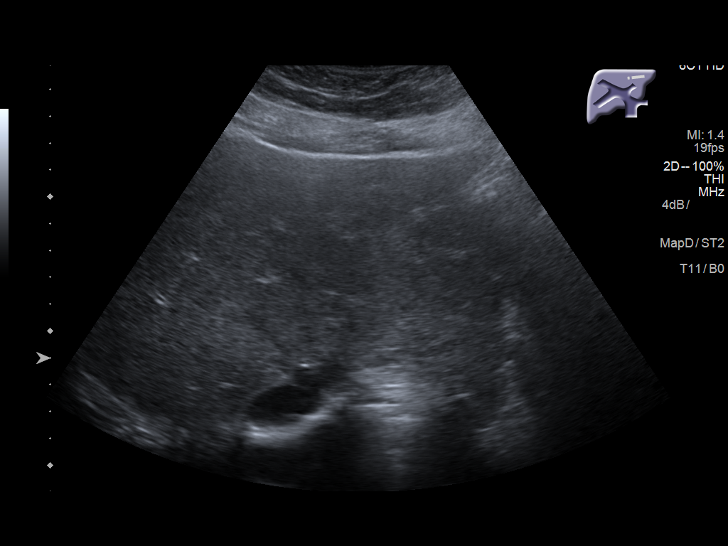
[im 32/52]
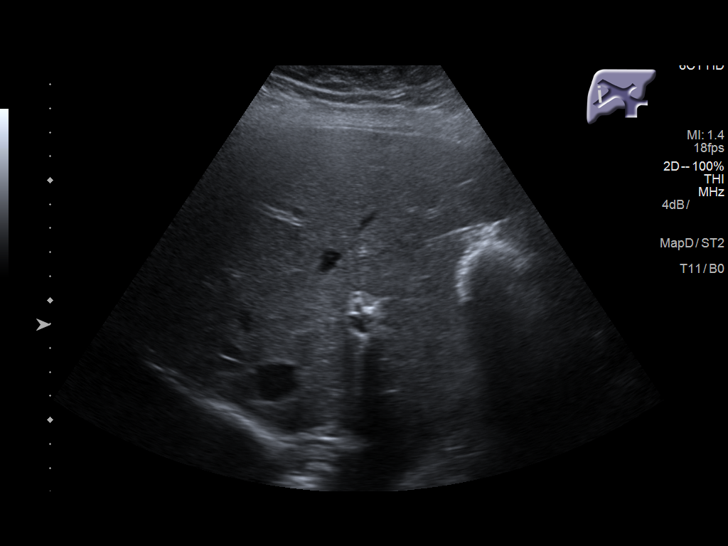
[im 35/52]
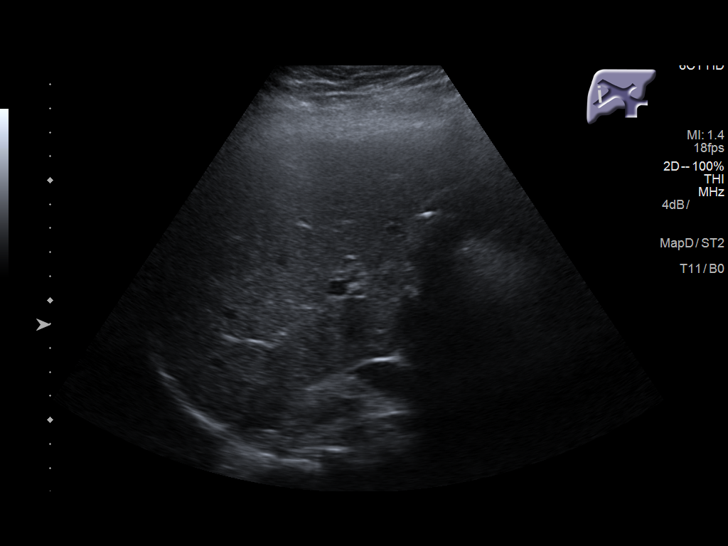
[im 39/52]
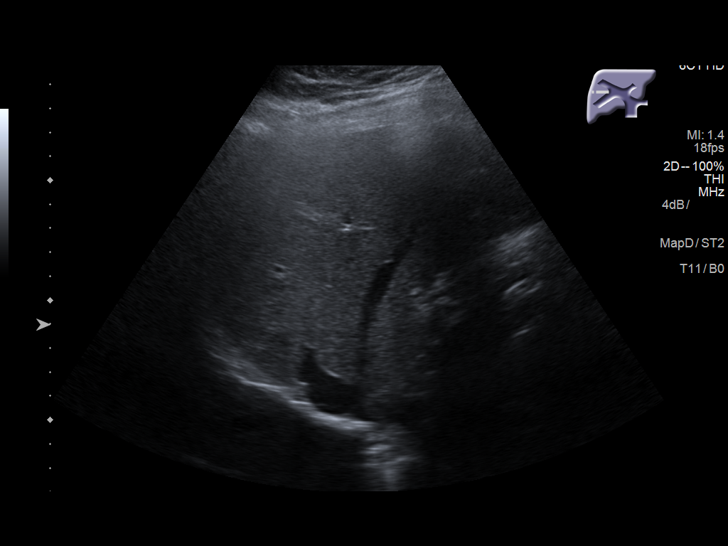
[im 43/52]
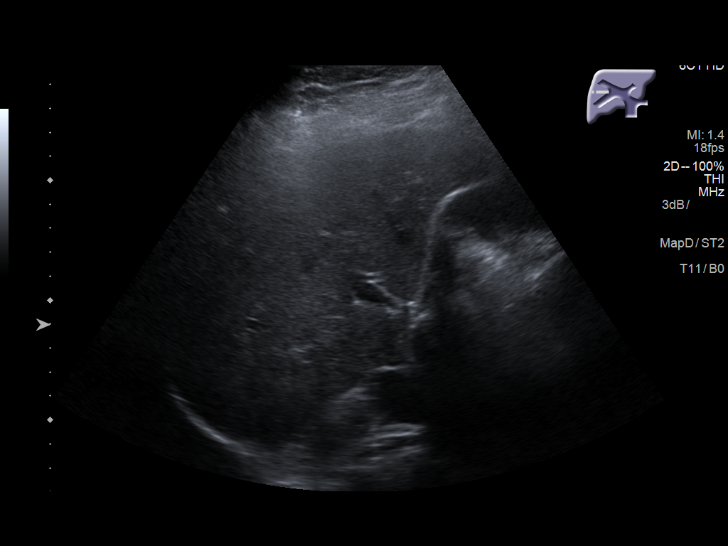
[im 47/52]
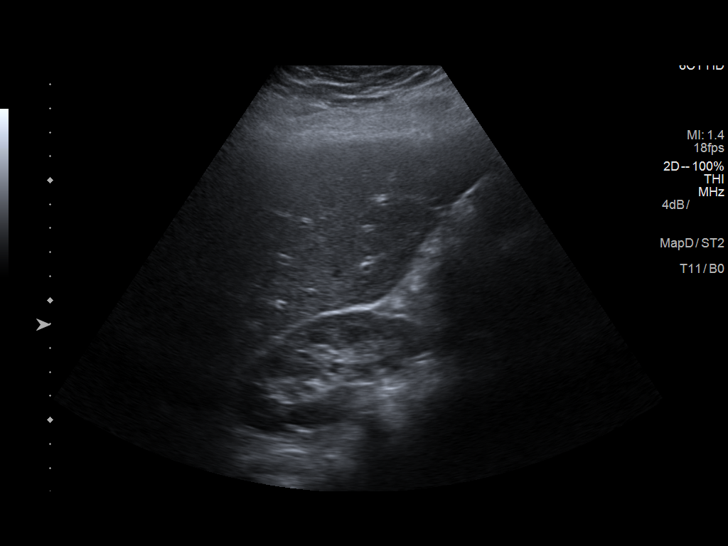
[im 52/52]
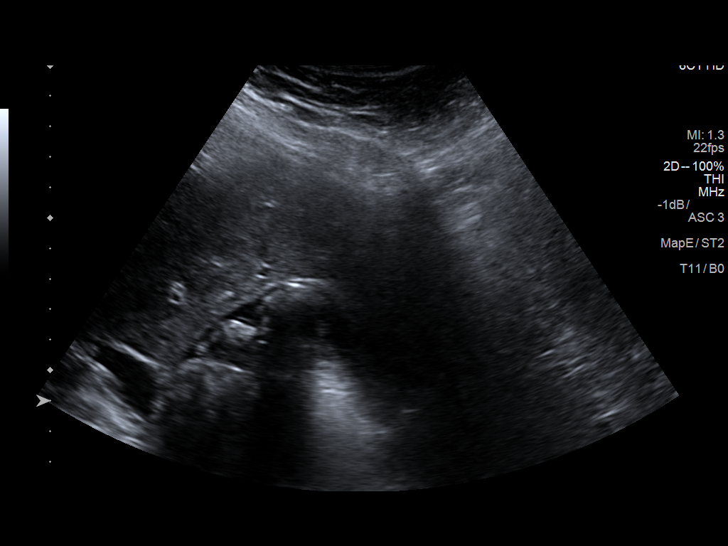

[14 of 25 positions shown; findings below may reference images not displayed]

FINDINGS: Gallbladder:

Gallbladder is well distended with multiple gallstones within. No
pericholecystic fluid or gallbladder wall thickening is seen.

Common bile duct:

Diameter: 4 mm.

Liver:

No focal lesion identified. Within normal limits in parenchymal
echogenicity. Portal vein is patent on color Doppler imaging with
normal direction of blood flow towards the liver.
IMPRESSION: Cholelithiasis without complicating factors.

## 2018-05-20 ENCOUNTER — Encounter: Payer: Self-pay | Admitting: Cardiovascular Disease

## 2018-05-20 ENCOUNTER — Ambulatory Visit: Payer: BC Managed Care – PPO | Admitting: Cardiovascular Disease

## 2018-05-20 VITALS — BP 110/74 | HR 77 | Ht 63.0 in | Wt 182.5 lb

## 2018-05-20 DIAGNOSIS — R000 Tachycardia, unspecified: Secondary | ICD-10-CM

## 2018-05-20 DIAGNOSIS — I493 Ventricular premature depolarization: Secondary | ICD-10-CM | POA: Diagnosis not present

## 2018-05-20 DIAGNOSIS — E7800 Pure hypercholesterolemia, unspecified: Secondary | ICD-10-CM

## 2018-05-20 DIAGNOSIS — F419 Anxiety disorder, unspecified: Secondary | ICD-10-CM | POA: Diagnosis not present

## 2018-05-20 MED ORDER — PROPRANOLOL HCL 20 MG PO TABS: 20.0000 mg | ORAL_TABLET | Freq: Three times a day (TID) | ORAL | 3 refills | Status: AC | PRN

## 2018-05-20 NOTE — Patient Instructions (Addendum)
Medication Instructions:   Please take 10 mg up to 20 mg of the propranolol for PVC, APC last 4 to 6 hours  For severe sx, take bystolic 5 mg once a day Lasts 24 hours   Labwork:  No new labs needed  Testing/Procedures:  No further testing at this time   Follow-Up: It was a pleasure seeing you in the office today. Please call us if you have new issues that need to be addressed before your next appt.  [phone]  Your physician wants you to follow-up in:  As needed  If you need a refill on your cardiac medications before your next appointment, please call your pharmacy.  For educational health videos Log in to : [email or web address] Or : [email or web address], password : [email or web address]

## 2018-05-26 ENCOUNTER — Other Ambulatory Visit: Payer: Self-pay | Admitting: Internal Medicine

## 2018-06-10 ENCOUNTER — Telehealth: Payer: Self-pay | Admitting: Cardiovascular Disease

## 2018-06-10 NOTE — Telephone Encounter (Signed)
Error

## 2018-09-04 ENCOUNTER — Ambulatory Visit: Payer: BC Managed Care – PPO | Admitting: Internal Medicine

## 2018-09-04 ENCOUNTER — Encounter: Payer: Self-pay | Admitting: Internal Medicine

## 2018-09-04 VITALS — BP 108/68 | HR 71 | Temp 98.1°F | Resp 18 | Wt 173.0 lb

## 2018-09-04 DIAGNOSIS — R05 Cough: Secondary | ICD-10-CM

## 2018-09-04 DIAGNOSIS — E7800 Pure hypercholesterolemia, unspecified: Secondary | ICD-10-CM

## 2018-09-04 DIAGNOSIS — K219 Gastro-esophageal reflux disease without esophagitis: Secondary | ICD-10-CM

## 2018-09-04 DIAGNOSIS — G43019 Migraine without aura, intractable, without status migrainosus: Secondary | ICD-10-CM

## 2018-09-04 DIAGNOSIS — I493 Ventricular premature depolarization: Secondary | ICD-10-CM

## 2018-09-04 DIAGNOSIS — F419 Anxiety disorder, unspecified: Secondary | ICD-10-CM | POA: Diagnosis not present

## 2018-09-04 DIAGNOSIS — R059 Cough, unspecified: Secondary | ICD-10-CM

## 2018-09-04 MED ORDER — ALBUTEROL SULFATE HFA 108 (90 BASE) MCG/ACT IN AERS: 2.0000 | INHALATION_SPRAY | Freq: Four times a day (QID) | RESPIRATORY_TRACT | 2 refills | Status: DC | PRN

## 2018-09-04 NOTE — Progress Notes (Signed)
Patient ID: Suriah Peragine, female   DOB: 1976/05/26, 42 y.o.   MRN: [id number]   Subjective:    Patient ID: [name], female    DOB: 1975-12-15, 42 y.o.   MRN: [id number]  HPI  Patient here for a scheduled follow up.  She reports she is doing relatively well.  Increased stress with her job.  Discussed with her today.  Seeing Dr Maryruth Bun.  Overall feels she is handling things relatively well.  Saw Dr Mariah Milling 05/2018.  Has propranolol if needed.  Has not felt needed lately.  Doing better from a cardiac standpoint.  Sees Dr Sherryll Burger for her headaches.  S/p block.  Hekping.  Some cough.  Has not had significant chest congestion or sinus pressure.  S/p implant 07/24/18.  Off abx now.  Bowels doing well.  No abdominal pain.     Past Medical History:  Diagnosis Date  . Abnormal Pap smear   . Allergy   . 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 Asthma    exercised induced  . Depression   . GERD (gastroesophageal reflux disease)   . Headache(784.0)   . Mononucleosis 1998  . PONV (postoperative nausea and vomiting)    takes awhile to wake up   Past Surgical History:  Procedure Laterality Date  . CARPAL TUNNEL RELEASE Bilateral 2016  . CESAREAN SECTION N/A 10/30/2013   Procedure: CESAREAN SECTION;  Surgeon: Meriel Pica, MD;  Location: [facility];  Service: Obstetrics;  Laterality: N/A;  PRIMARY  . CHOLECYSTECTOMY N/A 10/24/2017   Procedure: LAPAROSCOPIC CHOLECYSTECTOMY;  Surgeon: Lattie Haw, MD;  Location: [facility];  Service: General;  Laterality: N/A;  . COLPOSCOPY W/ BIOPSY / CURETTAGE    . DE QUERVAIN'S RELEASE Left 2016  . DILATION AND CURETTAGE OF UTERUS    . FOOT FRACTURE SURGERY Left   . HAND SURGERY Left    Fracture repair  . IVF     Family History  Problem Relation Age of Onset  . Arthritis Mother   . Diverticulitis Mother   . Thyroid disease Mother   . GER disease Mother   . Migraines Mother   . Arthritis Father   . Hypertension Father   . Hyperlipidemia Father   . Thyroid disease Father     . Diabetes Maternal Grandmother   . Heart attack Maternal Grandmother   . Alzheimer's disease Maternal Grandmother   . Heart disease Paternal Grandfather   . Heart attack Paternal Grandfather   . Anemia Sister   . Thyroid disease Sister   . Healthy Brother   . Emphysema Maternal Grandfather   . Breast cancer Paternal Grandmother   . Thyroid disease Sister   . Bipolar disorder Sister   . Arrhythmia Sister        Ventricular Tachycardia   Social History   Socioeconomic History  . Marital status: Married    Spouse name: Not on file  . Number of children: 1  . Years of education: Master's  . Highest education level: Not on file  Occupational History  . Occupation: Air cabin crew    Comment: Facilities manager  Social Needs  . Financial resource strain: Not on file  . Food insecurity:    Worry: Not on file    Inability: Not on file  . Transportation needs:    Medical: Not on file    Non-medical: Not on file  Tobacco Use  . Smoking status: Never Smoker  . Smokeless tobacco: Never Used  Substance and Sexual Activity  .  Alcohol use: No  . Drug use: No  . Sexual activity: Not Currently    Comment: couple is afraid  to mess something up  Lifestyle  . Physical activity:    Days per week: Not on file    Minutes per session: Not on file  . Stress: Not on file  Relationships  . Social connections:    Talks on phone: Not on file    Gets together: Not on file    Attends religious service: Not on file    Active member of club or organization: Not on file    Attends meetings of clubs or organizations: Not on file    Relationship status: Not on file  Other Topics Concern  . Not on file  Social History Narrative  . Not on file    Outpatient Encounter Medications as of 09/04/2018  Medication Sig  . acetaminophen (TYLENOL) 500 MG tablet Take 1,000-1,500 mg by mouth every 6 (six) hours as needed for moderate pain or headache.   . albuterol (PROAIR HFA) 108 (90 Base)  MCG/ACT inhaler Inhale 2 puffs into the lungs every 6 (six) hours as needed for wheezing or shortness of breath.  Marland Kitchen azelastine (ASTELIN) 0.1 % nasal spray PLACE 1 SPRAY INTO BOTH NOSTRILS 2 (TWO) TIMES DAILY. USE IN EACH NOSTRIL AS DIRECTED  . CALCIUM PO Take 1 tablet by mouth daily.  . Cholecalciferol (VITAMIN D PO) Take 2 capsules by mouth daily.   . fluticasone (FLONASE) 50 MCG/ACT nasal spray Place 1 spray into both nostrils at bedtime.  Marland Kitchen ibuprofen (ADVIL,MOTRIN) 200 MG tablet Take 800 mg by mouth every 6 (six) hours as needed for headache or moderate pain.  Marland Kitchen lansoprazole (PREVACID) 15 MG capsule Take 15 mg by mouth daily.   Marland Kitchen loratadine (CLARITIN) 10 MG tablet Take 10 mg by mouth daily as needed.   . magnesium oxide (MAG-OX) 400 (241.3 Mg) MG tablet TAKE 1 TABLET EVERY DAY (Patient taking differently: Take 400 mg by mouth once daily)  . Omega-3 Fatty Acids (FISH OIL BURP-LESS PO) Take 1 capsule by mouth daily.   . propranolol (INDERAL) 20 MG tablet Take 1 tablet (20 mg total) by mouth 3 (three) times daily as needed.  . rizatriptan (MAXALT) 10 MG tablet Take 10 mg by mouth daily as needed for migraine.   Marland Kitchen VYVANSE 70 MG capsule Take by mouth daily.  . [DISCONTINUED] albuterol (PROAIR HFA) 108 (90 Base) MCG/ACT inhaler Inhale 2 puffs into the lungs every 6 (six) hours as needed for wheezing or shortness of breath.  . [DISCONTINUED] nebivolol (BYSTOLIC) 5 MG tablet Take 1 tablet (5 mg total) by mouth daily as needed.   No facility-administered encounter medications on file as of 09/04/2018.     Review of Systems  Constitutional: Negative for appetite change and unexpected weight change.  HENT: Negative for congestion and sinus pressure.   Respiratory: Positive for cough. Negative for chest tightness and shortness of breath.   Cardiovascular: Negative for chest pain, palpitations and leg swelling.  Gastrointestinal: Negative for abdominal pain, diarrhea, nausea and vomiting.    Genitourinary: Negative for difficulty urinating and dysuria.  Musculoskeletal: Negative for joint swelling and myalgias.  Skin: Negative for color change and rash.  Neurological: Negative for dizziness and light-headedness.       Headaches better.    Psychiatric/Behavioral: Negative for agitation and dysphoric mood.       Objective:    Physical Exam  Constitutional: She appears well-developed and well-nourished. No distress.  HENT:  Nose: Nose normal.  Mouth/Throat: Oropharynx is clear and moist.  Neck: Neck supple. No thyromegaly present.  Cardiovascular: Normal rate and regular rhythm.  Pulmonary/Chest: Breath sounds normal. No respiratory distress. She has no wheezes.  Abdominal: Soft. Bowel sounds are normal. There is no tenderness.  Musculoskeletal: She exhibits no edema or tenderness.  Lymphadenopathy:    She has no cervical adenopathy.  Skin: No rash noted. No erythema.  Psychiatric: She has a normal mood and affect. Her behavior is normal.    BP 108/68 (BP Location: Left Arm, Patient Position: Sitting, Cuff Size: Normal)   Pulse 71   Temp 98.1 F (36.7 C) (Oral)   Resp 18   Wt 173 lb (78.5 kg)   SpO2 97%   BMI 30.65 kg/m  Wt Readings from Last 3 Encounters:  09/04/18 173 lb (78.5 kg)  05/20/18 182 lb 8 oz (82.8 kg)  03/25/18 182 lb 4.8 oz (82.7 kg)     Lab Results  Component Value Date   WBC 7.8 03/25/2018   HGB 13.1 03/25/2018   HCT 39.1 03/25/2018   PLT 305.0 03/25/2018   GLUCOSE 101 (H) 03/25/2018   CHOL 212 (H) 03/05/2018   TRIG 86.0 03/05/2018   HDL 67.30 03/05/2018   LDLCALC 127 (H) 03/05/2018   ALT 16 03/25/2018   AST 19 03/25/2018   NA 136 03/25/2018   K 4.6 03/25/2018   CL 102 03/25/2018   CREATININE 0.69 03/25/2018   BUN 12 03/25/2018   CO2 27 03/25/2018   TSH 1.11 03/25/2018      Assessment & Plan:   Problem List Items Addressed This Visit    Acid reflux    Controlled on prevacid.  Follow.        Anxiety    Doing well on  current regimen.  Increased stress with work.  Follow.  Sees psychiatry.        Common migraine with intractable migraine    S/p SPG block.  Doing better.  Follow.  Continue f/u with neurology.        Hypercholesteremia    Low cholesterol diet and exercise.  Follow lipid panel.        PVC (premature ventricular contraction)    Saw Dr Mariah Milling.  Has propranolol if needed.  Follow.  Doing better.         Other Visit Diagnoses    Cough    -  Primary   Cough.  Treat with saline nasal spray, Mucinex and nasacort as directed.  Follow.  Notify me if worsens.         Dale [place], MD

## 2018-09-06 ENCOUNTER — Encounter: Payer: Self-pay | Admitting: Internal Medicine

## 2018-09-06 NOTE — Assessment & Plan Note (Signed)
Low cholesterol diet and exercise.  Follow lipid panel.   

## 2018-09-06 NOTE — Assessment & Plan Note (Signed)
Saw Dr Mariah Milling.  Has propranolol if needed.  Follow.  Doing better.

## 2018-09-06 NOTE — Assessment & Plan Note (Signed)
Doing well on current regimen.  Increased stress with work.  Follow.  Sees psychiatry.

## 2018-09-06 NOTE — Assessment & Plan Note (Signed)
S/p SPG block.  Doing better.  Follow.  Continue f/u with neurology.

## 2018-09-06 NOTE — Assessment & Plan Note (Signed)
Controlled on prevacid.  Follow.   

## 2018-09-25 ENCOUNTER — Telehealth: Payer: BC Managed Care – PPO | Admitting: Nurse Practitioner

## 2018-09-25 DIAGNOSIS — J0100 Acute maxillary sinusitis, unspecified: Secondary | ICD-10-CM

## 2018-09-25 MED ORDER — AMOXICILLIN-POT CLAVULANATE 875-125 MG PO TABS: 1.0000 | ORAL_TABLET | Freq: Two times a day (BID) | ORAL | 0 refills | Status: DC

## 2018-09-25 NOTE — Progress Notes (Signed)

## 2018-12-15 ENCOUNTER — Encounter: Payer: Self-pay | Admitting: Family Medicine

## 2018-12-15 ENCOUNTER — Ambulatory Visit: Payer: BC Managed Care – PPO | Admitting: Family Medicine

## 2018-12-15 VITALS — BP 106/72 | HR 94 | Temp 98.5°F | Resp 16 | Ht 63.0 in | Wt 171.0 lb

## 2018-12-15 DIAGNOSIS — R110 Nausea: Secondary | ICD-10-CM

## 2018-12-15 DIAGNOSIS — R05 Cough: Secondary | ICD-10-CM | POA: Diagnosis not present

## 2018-12-15 DIAGNOSIS — R52 Pain, unspecified: Secondary | ICD-10-CM

## 2018-12-15 DIAGNOSIS — B349 Viral infection, unspecified: Secondary | ICD-10-CM

## 2018-12-15 DIAGNOSIS — R059 Cough, unspecified: Secondary | ICD-10-CM

## 2018-12-15 LAB — POC INFLUENZA A&B (BINAX/QUICKVUE)
Influenza A, POC: NEGATIVE
Influenza B, POC: NEGATIVE

## 2018-12-15 MED ORDER — ONDANSETRON 4 MG PO TBDP: 4.0000 mg | ORAL_TABLET | Freq: Three times a day (TID) | ORAL | 1 refills | Status: DC | PRN

## 2018-12-15 NOTE — Progress Notes (Signed)
Subjective:    Patient ID: Carrie [name], female    DOB: Feb 23, 1976, 43 y.o.   MRN: [id number]  HPI   Patient presents to clinic with any of cough, headache, fever and chills, body aches, nausea for the past 2 to 3 days.  Patient does work at the [employer], many of the students and staff has been sick with similar symptoms.  Patient has taken over-the-counter TheraFlu with decent effect in controlling body aches.  She has been trying to keep herself hydrated with water and Gatorade.  Denies shortness of breath or wheezing.  Denies chest pain.  Denies vomiting or diarrhea.  Patient Active Problem List   Diagnosis Date Noted  . PVC (premature ventricular contraction) 05/17/2018  . Tachycardia 03/25/2018  . Healthcare maintenance 03/07/2018  . Anxiety 03/07/2018  . Carpal tunnel syndrome 10/06/2017  . Radial styloid tenosynovitis 10/06/2017  . Calculus of gallbladder with chronic cholecystitis without obstruction   . Acute recurrent maxillary sinusitis 10/21/2016  . Lack of concentration 09/29/2016  . Closed displaced fracture of second metatarsal bone of left foot 06/18/2016  . 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 Allergic rhinitis 06/13/2015  . H/O arthritis 06/13/2015  . 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 Acid reflux 06/13/2015  . Common migraine with intractable migraine 06/13/2015  . Hypercholesteremia 06/13/2015  . Headache, menstrual migraine 06/13/2015  . Pregnancy 10/30/2013   Social History   Tobacco Use  . Smoking status: Never Smoker  . Smokeless tobacco: Never Used  Substance Use Topics  . Alcohol use: No    Review of Systems   Constitutional: + chills, fatigue and fever.  HENT: + congestion, ear pain, sinus pain and sore throat.   Eyes: Negative.   Respiratory: Negative for shortness of breath and wheezing. +cough Cardiovascular: Negative for chest pain, palpitations and leg swelling.  Gastrointestinal: +nausea. Negative for abdominal pain, diarrhea,  and vomiting.  Genitourinary: Negative for dysuria, frequency and urgency.  Musculoskeletal: Negative for arthralgias and myalgias.  Skin: Negative for color change, pallor and rash.  Neurological: Negative for syncope, light-headedness and headaches.  Psychiatric/Behavioral: The patient is not nervous/anxious.       Objective:   Physical Exam Vitals signs and nursing note reviewed.  Constitutional:      General: She is not in acute distress.    Appearance: She is ill-appearing (tired). She is not toxic-appearing.  HENT:     Head: Normocephalic and atraumatic.     Nose: Congestion and rhinorrhea present.     Mouth/Throat:     Mouth: Mucous membranes are moist.     Pharynx: Posterior oropharyngeal erythema present. No oropharyngeal exudate.  Eyes:     General: No scleral icterus.       Right eye: No discharge.        Left eye: No discharge.     Extraocular Movements: Extraocular movements intact.     Conjunctiva/sclera: Conjunctivae normal.  Neck:     Musculoskeletal: Neck supple. No neck rigidity.  Cardiovascular:     Rate and Rhythm: Normal rate and regular rhythm.  Pulmonary:     Effort: Pulmonary effort is normal. No respiratory distress.     Breath sounds: Normal breath sounds. No wheezing, rhonchi or rales.  Abdominal:     General: Bowel sounds are normal.     Palpations: Abdomen is soft.     Tenderness: There is no abdominal tenderness.  Lymphadenopathy:     Cervical: No cervical adenopathy.  Skin:    General: Skin is warm  and dry.  Neurological:     Mental Status: She is alert and oriented to person, place, and time.  Psychiatric:        Mood and Affect: Mood normal.        Behavior: Behavior normal.    Vitals:   12/15/18 1507  BP: 106/72  Pulse: 94  Resp: 16  Temp: 98.5 F (36.9 C)  SpO2: 97%       Assessment & Plan:   Viral illness, cough, nausea, body aches- point-of-care flu testing is negative in clinic however patient's symptoms do appear  consistent with a influenza or flulike illness.  Patient advised to get plenty of rest, do good handwashing and keep up good fluid intake.  Patient prescribed Zofran to use as needed for nausea.  Advised to alternate Tylenol and Motrin as needed to keep fever and body aches under control.  Patient given out of work note to allow time for rest and recovery.  She will keep her early scheduled follow-up PCP as planned.  Advised to return to clinic sooner if any issues arise.

## 2018-12-15 NOTE — Patient Instructions (Signed)
Viral Illness, Adult Viruses are tiny germs that can get into a person's body and cause illness. There are many different types of viruses, and they cause many types of illness. Viral illnesses can range from mild to severe. They can affect various parts of the body. Common illnesses that are caused by a virus include colds and the flu.   What are the causes? Many types of viruses can cause illness. Viruses invade cells in your body, multiply, and cause the infected cells to malfunction or die. When the cell dies, it releases more of the virus. When this happens, you develop symptoms of the illness, and the virus continues to spread to other cells. If the virus takes over the function of the cell, it can cause the cell to divide and grow out of control, as is the case when a virus causes cancer. Different viruses get into the body in different ways. You can get a virus by:  Swallowing food or water that is contaminated with the virus.  Breathing in droplets that have been coughed or sneezed into the air by an infected person.  Touching a surface that has been contaminated with the virus and then touching your eyes, nose, or mouth.  Being exposed to blood or fluids that contain the virus, either through an open cut or during a transfusion. If a virus enters your body, your body's defense system (immune system) will try to fight the virus. You may be at higher risk for a viral illness if your immune system is weak. What are the signs or symptoms? Symptoms vary depending on the type of virus and the location of the cells that it invades. Common symptoms of the main types of viral illnesses include: Cold and flu viruses  Fever.  Headache.  Sore throat.  Muscle aches.  Nasal congestion.  Cough. Digestive system (gastrointestinal) viruses  Fever.  Abdominal pain.  Nausea.  Diarrhea.  How is this treated? Viruses can be difficult to treat because they live within cells. Antibiotic  medicines do not treat viruses because these drugs do not get inside cells. Treatment for a viral illness may include:  Resting and drinking plenty of fluids.  Medicines to relieve symptoms. These can include over-the-counter medicine for pain and fever, medicines for cough or congestion, and medicines to relieve diarrhea.  Antiviral medicines. These drugs are available only for certain types of viruses. They may help reduce flu symptoms if taken early. There are also many antiviral medicines for hepatitis and HIV/AIDS. Some viral illnesses can be prevented with vaccinations. A common example is the flu shot. Follow these instructions at home: Medicines   Take over-the-counter and prescription medicines only as told by your health care provider.  If you were prescribed an antiviral medicine, take it as told by your health care provider. Do not stop taking the medicine even if you start to feel better.  Be aware of when antibiotics are needed and when they are not needed. Antibiotics do not treat viruses. If your health care provider thinks that you may have a bacterial infection as well as a viral infection, you may get an antibiotic. ? Do not ask for an antibiotic prescription if you have been diagnosed with a viral illness. That will not make your illness go away faster. ? Frequently taking antibiotics when they are not needed can lead to antibiotic resistance. When this develops, the medicine no longer works against the bacteria that it normally fights. General instructions  Drink enough fluids  to keep your urine clear or pale yellow.  Rest as much as possible.  Return to your normal activities as told by your health care provider. Ask your health care provider what activities are safe for you.  Keep all follow-up visits as told by your health care provider. This is important. How is this prevented? Take these actions to reduce your risk of viral infection:  Eat a healthy diet and  get enough rest.  Wash your hands often with soap and water. This is especially important when you are in public places. If soap and water are not available, use hand sanitizer.  Avoid close contact with friends and family who have a viral illness.  If you travel to areas where viral gastrointestinal infection is common, avoid drinking water or eating raw food.  Keep your immunizations up to date. Get a flu shot every year as told by your health care provider.  Do not share toothbrushes, nail clippers, razors, or needles with other people.  Always practice safe sex.  Contact a health care provider if:  You have symptoms of a viral illness that do not go away.  Your symptoms come back after going away.  Your symptoms get worse. Get help right away if:  You have trouble breathing.  You have a severe headache or a stiff neck.  You have severe vomiting or abdominal pain. This information is not intended to replace advice given to you by your health care provider. Make sure you discuss any questions you have with your health care provider. Document Released: 03/08/2016 Document Revised: 04/10/2016 Document Reviewed: 03/08/2016 Elsevier Interactive Patient Education  2019 [employer]

## 2018-12-23 ENCOUNTER — Encounter: Payer: Self-pay | Admitting: Internal Medicine

## 2018-12-23 ENCOUNTER — Telehealth: Payer: Self-pay | Admitting: Cardiovascular Disease

## 2018-12-23 ENCOUNTER — Emergency Department
Admission: EM | Admit: 2018-12-23 | Discharge: 2018-12-23 | Disposition: A | Discharge status: Home / Self Care | Payer: BC Managed Care – PPO | Attending: Emergency Medicine | Admitting: Emergency Medicine

## 2018-12-23 ENCOUNTER — Ambulatory Visit: Payer: BC Managed Care – PPO | Admitting: Internal Medicine

## 2018-12-23 ENCOUNTER — Encounter: Payer: Self-pay | Admitting: *Deleted

## 2018-12-23 ENCOUNTER — Emergency Department: Payer: BC Managed Care – PPO

## 2018-12-23 VITALS — BP 146/80 | HR 82 | Ht 63.0 in | Wt 173.0 lb

## 2018-12-23 DIAGNOSIS — Z79899 Other long term (current) drug therapy: Secondary | ICD-10-CM | POA: Insufficient documentation

## 2018-12-23 DIAGNOSIS — J45909 Unspecified asthma, uncomplicated: Secondary | ICD-10-CM | POA: Insufficient documentation

## 2018-12-23 DIAGNOSIS — R079 Chest pain, unspecified: Secondary | ICD-10-CM | POA: Insufficient documentation

## 2018-12-23 DIAGNOSIS — R0602 Shortness of breath: Secondary | ICD-10-CM | POA: Diagnosis not present

## 2018-12-23 DIAGNOSIS — I493 Ventricular premature depolarization: Secondary | ICD-10-CM | POA: Diagnosis not present

## 2018-12-23 LAB — BASIC METABOLIC PANEL
Anion gap: 7 (ref 5–15)
BUN: 12 mg/dL (ref 6–20)
CHLORIDE: 104 mmol/L (ref 98–111)
CO2: 25 mmol/L (ref 22–32)
Calcium: 9.4 mg/dL (ref 8.9–10.3)
Creatinine, Ser: 0.6 mg/dL (ref 0.44–1.00)
GFR calc Af Amer: >60 mL/min (ref >60)
GFR calc non Af Amer: >60 mL/min (ref >60)
GLUCOSE: 104 mg/dL — AB (ref 70–99)
POTASSIUM: 4 mmol/L (ref 3.5–5.1)
Sodium: 136 mmol/L (ref 135–145)

## 2018-12-23 LAB — CBC
HEMATOCRIT: 37.1 % (ref 36.0–46.0)
HEMOGLOBIN: 12.3 g/dL (ref 12.0–15.0)
MCH: 28.9 pg (ref 26.0–34.0)
MCHC: 33.2 g/dL (ref 30.0–36.0)
MCV: 87.3 fL (ref 80.0–100.0)
Platelets: 343 10*3/uL (ref 150–400)
RBC: 4.25 MIL/uL (ref 3.87–5.11)
RDW: 13.2 % (ref 11.5–15.5)
WBC: 9.2 10*3/uL (ref 4.0–10.5)
nRBC: 0 % (ref 0.0–0.2)

## 2018-12-23 LAB — TROPONIN I: Troponin I: <0.03 ng/mL (ref <0.03)

## 2018-12-23 LAB — FIBRIN DERIVATIVES D-DIMER (ARMC ONLY): Fibrin derivatives D-dimer (ARMC): 228.04 ng/mL (FEU) (ref 0.00–499.00)

## 2018-12-23 MED ORDER — SODIUM CHLORIDE 0.9% FLUSH: 3.0000 mL | Freq: Once | INTRAVENOUS | Status: DC

## 2018-12-23 MED ORDER — ASPIRIN 81 MG PO CHEW
324.0000 mg | CHEWABLE_TABLET | Freq: Once | ORAL | Status: AC
  Administered 2018-12-23: 324 mg via ORAL

## 2018-12-23 MED ORDER — KETOROLAC TROMETHAMINE 30 MG/ML IJ SOLN
30.0000 mg | Freq: Once | INTRAMUSCULAR | Status: AC
Start: 2018-12-23 — End: 2018-12-23
  Administered 2018-12-23: 30 mg via INTRAVENOUS
  Filled 2018-12-23: qty 1

## 2018-12-23 NOTE — ED Provider Notes (Addendum)
[facility] Emergency Department Provider Note   ____________________________________________   First MD Initiated Contact with Patient 12/23/18 1827     (approximate)  I have reviewed the triage vital signs and the nursing notes.   HISTORY  Chief Complaint Chest Pain    HPI Carrie [name] is a 43 y.o. female who complains of intermittent chest pain starting about 3 days ago comes on and last maybe half an hour and goes away today it was steady.  Is a pressure type pain.  She has some shortness of breath with it but no nausea or sweating.  Is in the middle and the left side of her chest.  Is not severe.  Nothing seems to bring it on or make it better go away.  Past Medical History:  Diagnosis Date  . Abnormal Pap smear   . Allergy   . 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 Asthma    exercised induced  . Depression   . GERD (gastroesophageal reflux disease)   . Headache(784.0)   . Mononucleosis 1998  . PONV (postoperative nausea and vomiting)    takes awhile to wake up    Patient Active Problem List   Diagnosis Date Noted  . Chest pain 12/23/2018  . PVC (premature ventricular contraction) 05/17/2018  . Tachycardia 03/25/2018  . Healthcare maintenance 03/07/2018  . Anxiety 03/07/2018  . Carpal tunnel syndrome 10/06/2017  . Radial styloid tenosynovitis 10/06/2017  . Calculus of gallbladder with chronic cholecystitis without obstruction   . Acute recurrent maxillary sinusitis 10/21/2016  . Lack of concentration 09/29/2016  . Closed displaced fracture of second metatarsal bone of left foot 06/18/2016  . 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 Allergic rhinitis 06/13/2015  . H/O arthritis 06/13/2015  . 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 Acid reflux 06/13/2015  . Common migraine with intractable migraine 06/13/2015  . Hypercholesteremia 06/13/2015  . Headache, menstrual migraine 06/13/2015  . Pregnancy 10/30/2013    Past Surgical History:  Procedure Laterality  Date  . CARPAL TUNNEL RELEASE Bilateral 2016  . CESAREAN SECTION N/A 10/30/2013   Procedure: CESAREAN SECTION;  Surgeon: Meriel Pica, MD;  Location: [facility] ORS;  Service: Obstetrics;  Laterality: N/A;  PRIMARY  . CHOLECYSTECTOMY N/A 10/24/2017   Procedure: LAPAROSCOPIC CHOLECYSTECTOMY;  Surgeon: Lattie Haw, MD;  Location: [facility] ORS;  Service: General;  Laterality: N/A;  . COLPOSCOPY W/ BIOPSY / CURETTAGE    . DE QUERVAIN'S RELEASE Left 2016  . DILATION AND CURETTAGE OF UTERUS    . FOOT FRACTURE SURGERY Left   . HAND SURGERY Left    Fracture repair  . IVF      Prior to Admission medications   Medication Sig Start Date End Date Taking? Authorizing Provider  acetaminophen (TYLENOL) 500 MG tablet Take 1,000-1,500 mg by mouth every 6 (six) hours as needed for moderate pain or headache.     [provider]  albuterol (PROAIR HFA) 108 (90 Base) MCG/ACT inhaler Inhale 2 puffs into the lungs every 6 (six) hours as needed for wheezing or shortness of breath. 09/04/18   Dale Independence, MD  CALCIUM PO Take 1 tablet by mouth daily.    [provider]  Cholecalciferol (VITAMIN D PO) Take 2 capsules by mouth daily.     [provider]  fluticasone (FLONASE) 50 MCG/ACT nasal spray Place 1 spray into both nostrils at bedtime. 03/05/18   Dale Hume, MD  ibuprofen (ADVIL,MOTRIN) 200 MG tablet Take 800 mg by mouth every 6 (six)  hours as needed for headache or moderate pain.    [provider]  lansoprazole (PREVACID) 15 MG capsule Take 15 mg by mouth daily.     [provider]  loratadine (CLARITIN) 10 MG tablet Take 10 mg by mouth daily as needed.     [provider]  magnesium oxide (MAG-OX) 400 (241.3 Mg) MG tablet TAKE 1 TABLET EVERY DAY Patient taking differently: Take 400 mg by mouth once daily 10/07/16   Dale DurhamScott, Charlene, MD  methylphenidate 54 MG PO CR tablet Take 54 mg by mouth daily. 12/17/18   [provider]  Omega-3 Fatty  Acids (FISH OIL BURP-LESS PO) Take 1 capsule by mouth daily.     [provider]  propranolol (INDERAL) 20 MG tablet Take 1 tablet (20 mg total) by mouth 3 (three) times daily as needed. 05/20/18   Antonieta IbaGollan, Timothy J, MD  rizatriptan (MAXALT) 10 MG tablet Take 10 mg by mouth daily as needed for migraine.  02/03/17   [provider]    Allergies Patient has no known allergies.  Family History  Problem Relation Age of Onset  . Arthritis Mother   . Diverticulitis Mother   . Thyroid disease Mother   . GER disease Mother   . Migraines Mother   . Arthritis Father   . Hypertension Father   . Hyperlipidemia Father   . Thyroid disease Father   . Diabetes Maternal Grandmother   . Heart attack Maternal Grandmother   . Alzheimer's disease Maternal Grandmother   . Heart disease Paternal Grandfather   . Heart attack Paternal Grandfather   . Anemia Sister   . Thyroid disease Sister   . Healthy Brother   . Emphysema Maternal Grandfather   . Breast cancer Paternal Grandmother   . Thyroid disease Sister   . Bipolar disorder Sister   . Arrhythmia Sister        Ventricular Tachycardia    Social History Social History   Tobacco Use  . Smoking status: Never Smoker  . Smokeless tobacco: Never Used  Substance Use Topics  . Alcohol use: No  . Drug use: No    Review of Systems  Constitutional: No fever/chills Eyes: No visual changes. ENT: No sore throat. Cardiovascular: See HPI Respiratory: See HPI. Gastrointestinal: No abdominal pain.  No nausea, no vomiting.  No diarrhea.  No constipation. Genitourinary: Negative for dysuria. Musculoskeletal: Negative for back pain. Skin: Negative for rash. Neurological: Negative for headaches, focal weakness or   ____________________________________________   PHYSICAL EXAM:  VITAL SIGNS: ED Triage Vitals [12/23/18 1549]  Enc Vitals Group     BP (!) 145/97     Pulse Rate 67     Resp 16     Temp 98.2 F (36.8 C)     Temp  Source Oral     SpO2 100 %     Weight      Height      Head Circumference      Peak Flow      Pain Score 7     Pain Loc      Pain Edu?      Excl. in GC?     Constitutional: Alert and oriented. Well appearing and in no acute distress. Eyes: Conjunctivae are normal.  Head: Atraumatic. Nose: No congestion/rhinnorhea. Mouth/Throat: Mucous membranes are moist.  Oropharynx non-erythematous. Neck: No stridor.   Cardiovascular: Normal rate, regular rhythm. Grossly normal heart sounds.  Good peripheral circulation. Respiratory: Normal respiratory effort.  No retractions.  Lungs CTAB. Gastrointestinal: Soft and nontender. No distention. No abdominal bruits. No CVA tenderness. Musculoskeletal: No lower extremity tenderness nor edema.  No joint effusions. Neurologic:  Normal speech and language. No gross focal neurologic deficits are appreciated.  Skin:  Skin is warm, dry and intact. No rash noted. Psychiatric: Mood and affect are normal. Speech and behavior are normal.  ____________________________________________   LABS (all labs ordered are listed, but only abnormal results are displayed)  Labs Reviewed  BASIC METABOLIC PANEL - Abnormal; Notable for the following components:      Result Value   Glucose, Bld 104 (*)    All other components within normal limits  CBC  TROPONIN I  TROPONIN I  FIBRIN DERIVATIVES D-DIMER ([facility] ONLY)  POC URINE PREG, ED   ____________________________________________  EKG EKG read interpreted by me shows normal sinus rhythm rate of 75 normal axis no acute ST-T wave changes essentially normal EKG ____________________________________________  RADIOLOGY  ED MD interpretatio chest x-ray read by radiology reviewed by me shows no acute disease  Official radiology report(s): Dg Chest 2 View  Result Date: 12/23/2018 CLINICAL DATA:  Left-sided chest pain and dyspnea x5 days. Cough x2 days. EXAM: CHEST - 2 VIEW COMPARISON:  None. FINDINGS: The heart size  and mediastinal contours are within normal limits. Both lungs are clear. The visualized skeletal structures are unremarkable. IMPRESSION: No active cardiopulmonary disease. Electronically Signed   By: Tollie Ethavid  Kwon M.D.   On: 12/23/2018 16:24    ____________________________________________   PROCEDURES  Procedure(s) performed:  Procedures  Critical Care performed:   ____________________________________________   INITIAL IMPRESSION / ASSESSMENT AND PLAN / ED COURSE  Patient with negative troponin normal EKG and chest x-ray normal d-dimer.  Patient says she is been under a lot of stress from the nurse asked.  This could just be stress related chest pain.  However she is 43 years old I will have her follow-up with cardiology just in case.  She can return if worse and follow-up with her regular doctor as well. Discussed patient with [facility]  He agrees with the plan.     Clinical Course as of Dec 23 2036  Wed Dec 23, 2018  1827 GFR, Est African American: >60 [PM]    Clinical Course User Index [PM] Arnaldo NatalMalinda, Paul F, MD     ____________________________________________   FINAL CLINICAL IMPRESSION(S) / ED DIAGNOSES  Final diagnoses:  Chest pain, unspecified type     ED Discharge Orders    None       Note:  This document was prepared using Dragon voice recognition software and may include unintentional dictation errors.    Arnaldo NatalMalinda, Paul F, MD 12/23/18 2038    Arnaldo NatalMalinda, Paul F, MD 12/23/18 2052

## 2018-12-23 NOTE — Progress Notes (Signed)
Follow-up Outpatient Visit Date: 12/23/2018  Primary Care Provider: Dale Bloomington, MD [address] [place] [address]  Primary Cardiologist: Dossie Arbour, MD  Chief Complaint: Chest pain  HPI:  Carrie [name] is a 43 y.o. year-old female with history of anxiety, depression, exercise-induced asthma, and migraine headaches, who presents for urgent evaluation of chest pain.  The patient was last seen by Dr. Mariah Milling in 05/2018 for evaluation of palpitations.  Preceding 14-day event monitor showed no significant arrhythmia.  PACs and PVCs were noted.  As needed propranolol was recommended.  Today, Carrie [name] presents for evaluation of chest pain that began 4 days ago.  She describes it as a pressure and tight feeling just above the left breast.  Occasionally it will radiate to the center of her chest or to the left lateral chest.  There are no clear exacerbating or alleviating factors.  The pain is 8/10 in intensity.  At its worst it has been 9.5/10.  Initially, the pain was intermittent but it has become nearly constant over the last day.  Chest pain has been accompanied by shortness of breath.  Carrie [name] notes that this chest pain is different than what she has experienced in the past with acid reflux.  She had a viral respiratory infection last week but has gotten over that other than an intermittent cough.  Chest pain seemed to improve today when lying flat for EKG.  She denies palpitations, lightheadedness, and edema.  Carrie [name] notes that she traveled to the beach this weekend.  She has not had any recent immobilization or other travel.  She denies a history of DVT/PE.  She has never had pain like this before.  --------------------------------------------------------------------------------------------------  Past Medical History:  Diagnosis Date  . Abnormal Pap smear   . Allergy   . 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 Asthma    exercised induced  . Depression   . GERD  (gastroesophageal reflux disease)   . Headache(784.0)   . Mononucleosis 1998  . PONV (postoperative nausea and vomiting)    takes awhile to wake up    Past Surgical History:  Procedure Laterality Date  . CARPAL TUNNEL RELEASE Bilateral 2016  . CESAREAN SECTION N/A 10/30/2013   Procedure: CESAREAN SECTION;  Surgeon: Meriel Pica, MD;  Location: [facility] ORS;  Service: Obstetrics;  Laterality: N/A;  PRIMARY  . CHOLECYSTECTOMY N/A 10/24/2017   Procedure: LAPAROSCOPIC CHOLECYSTECTOMY;  Surgeon: Lattie Haw, MD;  Location: [facility] ORS;  Service: General;  Laterality: N/A;  . COLPOSCOPY W/ BIOPSY / CURETTAGE    . DE QUERVAIN'S RELEASE Left 2016  . DILATION AND CURETTAGE OF UTERUS    . FOOT FRACTURE SURGERY Left   . HAND SURGERY Left    Fracture repair  . IVF      Current Meds  Medication Sig  . acetaminophen (TYLENOL) 500 MG tablet Take 1,000-1,500 mg by mouth every 6 (six) hours as needed for moderate pain or headache.   . albuterol (PROAIR HFA) 108 (90 Base) MCG/ACT inhaler Inhale 2 puffs into the lungs every 6 (six) hours as needed for wheezing or shortness of breath.  Marland Kitchen CALCIUM PO Take 1 tablet by mouth daily.  . Cholecalciferol (VITAMIN D PO) Take 2 capsules by mouth daily.   . fluticasone (FLONASE) 50 MCG/ACT nasal spray Place 1 spray into both nostrils at bedtime.  Marland Kitchen ibuprofen (ADVIL,MOTRIN) 200 MG tablet Take 800 mg by mouth every 6 (six) hours as needed for headache or  moderate pain.  Marland Kitchen. lansoprazole (PREVACID) 15 MG capsule Take 15 mg by mouth daily.   Marland Kitchen. loratadine (CLARITIN) 10 MG tablet Take 10 mg by mouth daily as needed.   . magnesium oxide (MAG-OX) 400 (241.3 Mg) MG tablet TAKE 1 TABLET EVERY DAY (Patient taking differently: Take 400 mg by mouth once daily)  . methylphenidate 54 MG PO CR tablet Take 54 mg by mouth daily.  . Omega-3 Fatty Acids (FISH OIL BURP-LESS PO) Take 1 capsule by mouth daily.   . propranolol (INDERAL) 20 MG tablet Take 1 tablet (20 mg total) by mouth  3 (three) times daily as needed.  . rizatriptan (MAXALT) 10 MG tablet Take 10 mg by mouth daily as needed for migraine.     Allergies: Patient has no known allergies.  Social History   Tobacco Use  . Smoking status: Never Smoker  . Smokeless tobacco: Never Used  Substance Use Topics  . Alcohol use: No  . Drug use: No    Family History  Problem Relation Age of Onset  . Arthritis Mother   . Diverticulitis Mother   . Thyroid disease Mother   . GER disease Mother   . Migraines Mother   . Arthritis Father   . Hypertension Father   . Hyperlipidemia Father   . Thyroid disease Father   . Diabetes Maternal Grandmother   . Heart attack Maternal Grandmother   . Alzheimer's disease Maternal Grandmother   . Heart disease Paternal Grandfather   . Heart attack Paternal Grandfather   . Anemia Sister   . Thyroid disease Sister   . Healthy Brother   . Emphysema Maternal Grandfather   . Breast cancer Paternal Grandmother   . Thyroid disease Sister   . Bipolar disorder Sister   . Arrhythmia Sister        Ventricular Tachycardia    Review of Systems: A 12-system review of systems was performed and was negative except as noted in the HPI.  --------------------------------------------------------------------------------------------------  Physical Exam: BP (!) 146/80 (BP Location: Left Arm, Patient Position: Sitting, Cuff Size: Normal)   Pulse 82   Ht 5\' 3"  (1.6 m)   Wt 173 lb (78.5 kg)   BMI 30.65 kg/m   General: NAD. HEENT: No conjunctival pallor or scleral icterus. Moist mucous membranes.  OP clear. Neck: Supple without lymphadenopathy, thyromegaly, JVD, or HJR. Lungs: Normal work of breathing. Clear to auscultation bilaterally without wheezes or crackles. Heart: Regular rate and rhythm without murmurs, rubs, or gallops. Non-displaced PMI.  Mild left lateral chest wall tenderness. Abd: Bowel sounds present. Soft, NT/ND without hepatosplenomegaly Ext: No lower extremity  edema. Radial, PT, and DP pulses are 2+ bilaterally. Skin: Warm and dry without rash.  EKG: Normal sinus rhythm with isolated PVC and borderline left atrial enlargement.  Lab Results  Component Value Date   WBC 7.8 03/25/2018   HGB 13.1 03/25/2018   HCT 39.1 03/25/2018   MCV 88.3 03/25/2018   PLT 305.0 03/25/2018    Lab Results  Component Value Date   NA 136 03/25/2018   K 4.6 03/25/2018   CL 102 03/25/2018   CO2 27 03/25/2018   BUN 12 03/25/2018   CREATININE 0.69 03/25/2018   GLUCOSE 101 (H) 03/25/2018   ALT 16 03/25/2018    Lab Results  Component Value Date   CHOL 212 (H) 03/05/2018   HDL 67.30 03/05/2018   LDLCALC 127 (H) 03/05/2018   TRIG 86.0 03/05/2018   CHOLHDL 3 03/05/2018    --------------------------------------------------------------------------------------------------  ASSESSMENT AND PLAN: Chest pain Patient does not have significant risk factors for CAD.  EKG today does not show any ischemic findings.  Exam is unrevealing other than mild tenderness along the left chest wall (Carrie [name] attributes this to soreness from rubbing the area in an attempt to relieve her chest pain).  I suspect her pain is most likely noncardiac, though given its persistent nature and ongoing 8/10 intensity, I have recommended that Carrie [name] be evaluated in the emergency department.  We have administered aspirin 324 mg x 1 in the office.  PVCs Isolated PVC noted today.  Patient can continue to use as needed propranolol for palpitations.  Follow-up: To be determined based on outcome of ED visit.  Yvonne Kendallhristopher Lisanne Ponce, MD 12/23/2018 3:11 PM

## 2018-12-23 NOTE — ED Triage Notes (Signed)
Pt is here for CP which began 3 days ago.  Pain has been intermittent until today and today it has been steady.  Pt states that the pain is a pressure, a tightness.  Pt states that there is some intermittent sob with this.  Pt denies any nausea.  Pt states that the pain is in left side.

## 2018-12-23 NOTE — ED Notes (Signed)
Pt denies any risk factors but when asked states that she has been under a lot of stress, pt tearful when she came into triage.

## 2018-12-23 NOTE — Telephone Encounter (Signed)
Pt c/o of Chest Pain: STAT if CP now or developed within 24 hours  1. Are you having CP right now? yes  2. Are you experiencing any other symptoms (ex. SOB, nausea, vomiting, sweating)? Chest pressure, tightness, SOB  3. How long have you been experiencing CP? On and off since Saturday - getting more frequent   4. Is your CP continuous or coming and going? Continuous   5. Have you taken Nitroglycerin? No  ?

## 2018-12-23 NOTE — Telephone Encounter (Signed)
Spoke with patient and after discussing with her husband she would like to come in for evaluation and EKG. Scheduled her to see DOD for today and will route this to him for review. She states her dad had cholesterol issues, grandfather had MI, Sister has tachycardia and PVC's. Scheduled her to see provider at 2:40PM and confirmed.

## 2018-12-23 NOTE — Telephone Encounter (Signed)
Ok.  Raiza Kiesel, MD [facility] Pager: [phone]  

## 2018-12-23 NOTE — Discharge Instructions (Addendum)
The EKG chest x-ray and heart blood work all were normal today.  Please try Motrin 3 of the over-the-counter pills 3 times a day with food for about 3 days.   This is probably not your heart but I will have you follow-up with a cardiologist just in case.  Please either follow-up with Dr. Mariah MillingGollan or Dr. Welton FlakesKhan.  Call t 1 of their offices in the morning let them know you are in the emergency room with chest pain they will follow-up with you quickly.  Please return for any further problems.

## 2018-12-23 NOTE — Telephone Encounter (Signed)
Spoke with patient and she reports that she started having chest pain on Saturday and that it was off and on until today. Today she states that it is more consistent and persists on the left side above her breast and around her side. She reports shortness of breath and fatigue as well. Reviewed with DOD and based on these symptoms recommendations are for her to proceed to ER for further evaluation and ischemic work up. Reviewed that I do have appointment available today but with her persistent chest pain that we would potentially send her to ER as well. She wants to discuss with her husband and will call me back.

## 2018-12-25 ENCOUNTER — Ambulatory Visit: Payer: BC Managed Care – PPO | Admitting: Physician Assistant

## 2018-12-25 NOTE — Progress Notes (Signed)
Cardiology Office Note Date:  12/28/2018  Patient ID:  Carrie [name], DOB 1976-05-07, MRN [id number] PCP:  Dale Farmland, MD  Cardiologist:  Dr. Mariah Milling, MD    Chief Complaint: ED follow up  History of Present Illness: Carrie [name] is a 43 y.o. female with history of chest pain, palpitations, anxiety, depression, exercise-induced asthma, and migraine disorder who presents for ED follow up for chest pain.  The patient has been previously seen by Dr. Mariah Milling in 05/2018 for palpitations with preceding 14-day event monitor showing no significant arrhythmia with PACs and PVCs noted. PRN propranolol was recommended. She was seen as an urgent visit by Dr. Okey Dupre on 12/23/2018 for chest pain that began 4 days prior to her visit that was described as pressure and a tight feeling just above the left breast and occasionally radiated to the center of her chest or to the left lateral chest. There were no exacerbating factors. The pain was rated an 8-9.5/10 and had progressed to being nearly constant. It was associated with SOB. The pain felt different than what she had experienced in the past with reflux. She was noted to have had a recent viral URI that had resolved outside of an intermittent cough. She noted her chest pain improved when laying flat in our office for her EKG. She also noted a recent trip to the beach the weekend prior to her visit with Dr. Okey Dupre. In the office, her EKG showed NSR with isolated PVC and borderline LAE. Given her ongoing chest pain, she was sent to the ED.   Work up in the ED on 2/12 showed troponin negative x 2, d dimer normal at 228.24, K+ 4.0, SCr 0.60, unremarkable CBC, influenza negative, CXR not acute. Outpatient follow up was advised.   Patient comes in today continuing to note left-sided chest pain that is sometimes intermittent occurring several times throughout the day and other times lasting the majority of the day.  Chest pain is not exacerbated by exertion.  Symptoms are  improved when laying supine.  Pain is described as a tightness and is associated with shortness of breath.  Pain is unchanged from her previous visit.  She denies any dizziness, presyncope, or syncope.  No lower extremity swelling, abdominal distention, orthopnea, PND, early satiety.   Past Medical History:  Diagnosis Date  . Abnormal Pap smear   . Allergy   . 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 Asthma    exercised induced  . Depression   . GERD (gastroesophageal reflux disease)   . Headache(784.0)   . Mononucleosis 1998  . PONV (postoperative nausea and vomiting)    takes awhile to wake up    Past Surgical History:  Procedure Laterality Date  . CARPAL TUNNEL RELEASE Bilateral 2016  . CESAREAN SECTION N/A 10/30/2013   Procedure: CESAREAN SECTION;  Surgeon: Meriel Pica, MD;  Location: [facility];  Service: Obstetrics;  Laterality: N/A;  PRIMARY  . CHOLECYSTECTOMY N/A 10/24/2017   Procedure: LAPAROSCOPIC CHOLECYSTECTOMY;  Surgeon: Lattie Haw, MD;  Location: [facility] ORS;  Service: General;  Laterality: N/A;  . COLPOSCOPY W/ BIOPSY / CURETTAGE    . DE QUERVAIN'S RELEASE Left 2016  . DILATION AND CURETTAGE OF UTERUS    . FOOT FRACTURE SURGERY Left   . HAND SURGERY Left    Fracture repair  . IVF      Current Meds  Medication Sig  . acetaminophen (TYLENOL) 500 MG tablet Take 1,000-1,500 mg by mouth every 6 (six) hours as  needed for moderate pain or headache.   . albuterol (PROAIR HFA) 108 (90 Base) MCG/ACT inhaler Inhale 2 puffs into the lungs every 6 (six) hours as needed for wheezing or shortness of breath.  Marland Kitchen CALCIUM PO Take 1 tablet by mouth daily.  . Cholecalciferol (VITAMIN D PO) Take 2 capsules by mouth daily.   . fluticasone (FLONASE) 50 MCG/ACT nasal spray Place 1 spray into both nostrils at bedtime.  Marland Kitchen ibuprofen (ADVIL,MOTRIN) 200 MG tablet Take 800 mg by mouth every 6 (six) hours as needed for headache or moderate pain.  Marland Kitchen lansoprazole (PREVACID) 15 MG capsule Take 15 mg by  mouth daily.   Marland Kitchen loratadine (CLARITIN) 10 MG tablet Take 10 mg by mouth daily as needed.   . magnesium oxide (MAG-OX) 400 (241.3 Mg) MG tablet TAKE 1 TABLET EVERY DAY (Patient taking differently: Take 400 mg by mouth once daily)  . methylphenidate 54 MG PO CR tablet Take 54 mg by mouth daily.  . Omega-3 Fatty Acids (FISH OIL BURP-LESS PO) Take 1 capsule by mouth daily.   . propranolol (INDERAL) 20 MG tablet Take 1 tablet (20 mg total) by mouth 3 (three) times daily as needed.  . rizatriptan (MAXALT) 10 MG tablet Take 10 mg by mouth daily as needed for migraine.     Allergies:   Patient has no known allergies.   Social History:  The patient  reports that she has never smoked. She has never used smokeless tobacco. She reports that she does not drink alcohol or use drugs.   Family History:  The patient's family history includes Alzheimer's disease in her maternal grandmother; Anemia in her sister; Arrhythmia in her sister; Arthritis in her father and mother; Bipolar disorder in her sister; Breast cancer in her paternal grandmother; Diabetes in her maternal grandmother; Diverticulitis in her mother; Emphysema in her maternal grandfather; GER disease in her mother; Healthy in her brother; Heart attack in her maternal grandmother and paternal grandfather; Heart disease in her paternal grandfather; Hyperlipidemia in her father; Hypertension in her father; Migraines in her mother; Thyroid disease in her father, mother, sister, and sister.  ROS:   Review of Systems  Constitutional: Positive for malaise/fatigue. Negative for chills, diaphoresis, fever and weight loss.  HENT: Negative for congestion.   Eyes: Negative for discharge and redness.  Respiratory: Negative for cough, hemoptysis, sputum production, shortness of breath and wheezing.   Cardiovascular: Positive for chest pain. Negative for palpitations, orthopnea, claudication, leg swelling and PND.  Gastrointestinal: Negative for abdominal pain,  blood in stool, heartburn, melena, nausea and vomiting.  Genitourinary: Negative for hematuria.  Musculoskeletal: Negative for falls and myalgias.  Skin: Negative for rash.  Neurological: Positive for weakness. Negative for dizziness, tingling, tremors, sensory change, speech change, focal weakness and loss of consciousness.  Endo/Heme/Allergies: Does not bruise/bleed easily.  Psychiatric/Behavioral: Negative for substance abuse. The patient is nervous/anxious.   All other systems reviewed and are negative.    PHYSICAL EXAM:  VS:  BP 136/70 (BP Location: Left Arm, Patient Position: Sitting, Cuff Size: Normal)   Pulse 90   Ht 5\' 3"  (1.6 m)   Wt 172 lb (78 kg)   BMI 30.47 kg/m  BMI: Body mass index is 30.47 kg/m.  Physical Exam  Constitutional: She is oriented to person, place, and time. She appears well-developed and well-nourished.  HENT:  Head: Normocephalic and atraumatic.  Eyes: Right eye exhibits no discharge. Left eye exhibits no discharge.  Neck: Normal range of motion. No JVD present.  Cardiovascular: Normal rate, regular rhythm, S1 normal, S2 normal and normal heart sounds. Exam reveals no distant heart sounds, no friction rub, no midsystolic click and no opening snap.  No murmur heard. Pulses:      Posterior tibial pulses are 2+ on the right side and 2+ on the left side.  Palpation of the anterior chest wall somewhat reproduces the patient's symptoms  Pulmonary/Chest: Effort normal and breath sounds normal. No respiratory distress. She has no decreased breath sounds. She has no wheezes. She has no rales. She exhibits no tenderness.  Abdominal: Soft. She exhibits no distension. There is no abdominal tenderness.  Musculoskeletal:        General: No edema.  Neurological: She is alert and oriented to person, place, and time.  Skin: Skin is warm and dry. No cyanosis. Nails show no clubbing.  Psychiatric: She has a normal mood and affect. Her speech is normal and behavior is  normal. Judgment and thought content normal.     EKG:  Was ordered and interpreted by me today. Shows NSR, 90 bpm, normal axis, no acute ST-T changes  Recent Labs: 03/25/2018: ALT 16; TSH 1.11 12/23/2018: BUN 12; Creatinine, Ser 0.60; Hemoglobin 12.3; Platelets 343; Potassium 4.0; Sodium 136  03/05/2018: Cholesterol 212; HDL 67.30; LDL Cholesterol 127; Total CHOL/HDL Ratio 3; Triglycerides 86.0; VLDL 17.2   Estimated Creatinine Clearance: 91.5 mL/min (by C-G formula based on SCr of 0.6 mg/dL).   Wt Readings from Last 3 Encounters:  12/28/18 172 lb (78 kg)  12/23/18 173 lb (78.5 kg)  12/15/18 171 lb (77.6 kg)     Other studies reviewed: Additional studies/records reviewed today include: summarized above  ASSESSMENT AND PLAN:  1. Chest pain with moderate risk for cardiac etiology with mostly atypical features: Long discussion with the patient and her husband regarding noninvasive and invasive cardiac evaluations.  Ultimately, we decided to treat for potential vasospasm with verapamil 120 mg daily as well as treadmill Myoview to evaluate for high risk ischemia.  The patient has fairly atypical symptoms and has had negative troponins x2 thus far in her work-up.  Her only cardiac risk factor at this time would be mild obesity.  There is no family history of premature coronary disease or sudden cardiac death.  The patient has never been a smoker, does not have diabetes, hypertension, or hyperlipidemia.  We did discuss the possibility of scad, though after discussion with her husband the decision has ultimately been made to pursue noninvasive evaluation at this time.  Disposition: F/u with Dr. Mariah MillingGollan or an APP in 2 weeks.  Current medicines are reviewed at length with the patient today.  The patient did not have any concerns regarding medicines.  Signed, Eula Listenyan Keelyn Monjaras, PA-C 12/28/2018 2:26 PM     [facility] -  [address] [place], [place] [address] [phone]

## 2018-12-28 ENCOUNTER — Encounter: Payer: Self-pay | Admitting: Physician Assistant

## 2018-12-28 ENCOUNTER — Ambulatory Visit: Payer: BC Managed Care – PPO | Admitting: Physician Assistant

## 2018-12-28 VITALS — BP 136/70 | HR 90 | Ht 63.0 in | Wt 172.0 lb

## 2018-12-28 DIAGNOSIS — R079 Chest pain, unspecified: Secondary | ICD-10-CM

## 2018-12-28 DIAGNOSIS — R0602 Shortness of breath: Secondary | ICD-10-CM

## 2018-12-28 MED ORDER — VERAPAMIL HCL ER 120 MG PO TBCR: 120.0000 mg | EXTENDED_RELEASE_TABLET | Freq: Every day | ORAL | 3 refills | Status: DC

## 2018-12-28 NOTE — Patient Instructions (Signed)
Medication Instructions:  Your physician has recommended you make the following change in your medication:  1- START Verapamil Take 1 tablet (120 mg total) by mouth at bedtime If you need a refill on your cardiac medications before your next appointment, please call your pharmacy.   Lab work: None ordered  If you have labs (blood work) drawn today and your tests are completely normal, you will receive your results only by: Marland Kitchen MyChart Message (if you have MyChart) OR . A paper copy in the mail If you have any lab test that is abnormal or we need to change your treatment, we will call you to review the results.  Testing/Procedures: 1- Treadmill Myoview [facility] MYOVIEW  Your caregiver has ordered a Stress Test with nuclear imaging. The purpose of this test is to evaluate the blood supply to your heart muscle. This procedure is referred to as a "Non-Invasive Stress Test." This is because other than having an IV started in your vein, nothing is inserted or "invades" your body. Cardiac stress tests are done to find areas of poor blood flow to the heart by determining the extent of coronary artery disease (CAD). Some patients exercise on a treadmill, which naturally increases the blood flow to your heart, while others who are  unable to walk on a treadmill due to physical limitations have a pharmacologic/chemical stress agent called Lexiscan . This medicine will mimic walking on a treadmill by temporarily increasing your coronary blood flow.   Please note: these test may take anywhere between 2-4 hours to complete  PLEASE REPORT TO [facility] MEDICAL MALL ENTRANCE  THE VOLUNTEERS AT THE FIRST DESK WILL DIRECT YOU WHERE TO GO  Date of Procedure:_____________________________________  Arrival Time for Procedure:______________________________  Instructions regarding medication:   __x__:  Hold betablocker(s) night before procedure and morning of procedure (propranolol)   PLEASE NOTIFY THE OFFICE AT LEAST 24  HOURS IN ADVANCE IF YOU ARE UNABLE TO KEEP YOUR APPOINTMENT.  [phone] AND  PLEASE NOTIFY NUCLEAR MEDICINE AT [facility] AT LEAST 24 HOURS IN ADVANCE IF YOU ARE UNABLE TO KEEP YOUR APPOINTMENT. [phone]  How to prepare for your Myoview test:  1. Do not eat or drink after midnight 2. No caffeine for 24 hours prior to test 3. No smoking 24 hours prior to test. 4. Your medication may be taken with water.  If your doctor stopped a medication because of this test, do not take that medication. 5. Ladies, please do not wear dresses.  Skirts or pants are appropriate. Please wear a short sleeve shirt. 6. No perfume, cologne or lotion. 7. Wear comfortable walking shoes. No heels!  Follow-Up: At [facility], you and your health needs are our priority.  As part of our continuing mission to provide you with exceptional heart care, we have created designated Provider Care Teams.  These Care Teams include your primary Cardiologist (physician) and Advanced Practice Providers (APPs -  Physician Assistants and Nurse Practitioners) who all work together to provide you with the care you need, when you need it. You will need a follow up appointment in 2 weeks.  You may see Dr. Mariah Milling or Eula Listen, PA-C

## 2018-12-29 ENCOUNTER — Encounter
Admission: RE | Admit: 2018-12-29 | Discharge: 2018-12-29 | Disposition: A | Discharge status: Home / Self Care | Payer: BC Managed Care – PPO | Source: Ambulatory Visit | Attending: Physician Assistant | Admitting: Physician Assistant

## 2018-12-29 DIAGNOSIS — R0602 Shortness of breath: Secondary | ICD-10-CM | POA: Insufficient documentation

## 2018-12-29 LAB — NM MYOCAR MULTI W/SPECT W/WALL MOTION / EF
CSEPEW: 10.1 METS
Exercise duration (min): 8 min
Exercise duration (sec): 40 s
LV dias vol: 67 mL (ref 46–106)
LV sys vol: 22 mL
MPHR: 179 {beats}/min
Peak HR: 160 {beats}/min
Percent HR: 89 %
Rest HR: 63 {beats}/min
TID: 0.79

## 2018-12-29 MED ORDER — TECHNETIUM TC 99M TETROFOSMIN IV KIT
31.4200 | PACK | Freq: Once | INTRAVENOUS | Status: AC | PRN
  Administered 2018-12-29: 31.42 via INTRAVENOUS

## 2018-12-29 MED ORDER — TECHNETIUM TC 99M TETROFOSMIN IV KIT
10.0400 | PACK | Freq: Once | INTRAVENOUS | Status: AC | PRN
  Administered 2018-12-29: 10.04 via INTRAVENOUS

## 2018-12-29 MED ORDER — REGADENOSON 0.4 MG/5ML IV SOLN
0.4000 mg | Freq: Once | INTRAVENOUS | Status: AC
  Administered 2018-12-29: 0.4 mg via INTRAVENOUS

## 2019-01-19 NOTE — Progress Notes (Signed)
Cardiology Office Note Date:  01/20/2019  Patient ID:  Carrie [name], DOB 1975/11/24, MRN [id number] PCP:  Dale Leflore, MD  Cardiologist:  Dr. Mariah Milling, MD    Chief Complaint: Follow up  History of Present Illness: Carrie [name] is a 43 y.o. female with history of chest pain, palpitations, anxiety, depression, exercise-induced asthma, and migraine disorder who presents for follow up for chest pain.  The patient has been previously seen by Dr. Mariah Milling in 05/2018 for palpitations with preceding 14-day event monitor showing no significant arrhythmia with PACs and PVCs noted. PRN propranolol was recommended. She was seen as an urgent visit by Dr. Okey Dupre on 12/23/2018 for chest pain that began 4 days prior to her visit that was described as pressure and a tight feeling just above the left breast and occasionally radiated to the center of her chest or to the left lateral chest. There were no exacerbating factors. The pain was rated an 8-9.5/10 and had progressed to being nearly constant. It was associated with SOB. The pain felt different than what she had experienced in the past with reflux. She was noted to have had a recent viral URI that had resolved outside of an intermittent cough. She noted her chest pain improved when laying flat in our office for her EKG. She also noted a recent trip to the beach the weekend prior to her visit with Dr. Okey Dupre. In the office, her EKG showed NSR with isolated PVC and borderline LAE. Given her ongoing chest pain, she was sent to the ED. Work up in the ED on 2/12 showed troponin negative x 2, d dimer normal at 228.24, K+ 4.0, SCr 0.60, unremarkable CBC, influenza negative, CXR not acute. In office follow up with cardiology on 12/28/2018, she continued to note left-sided chest pain that was intermittent and not exacerbated by exertion and improved when laying supine. She was empirically started on verapamil and underwent treadmill Myoview on 12/29/2018 that showed no significant  ischemia or scar, EF >65%. Low risk study.   She comes in today continuing to note daily upper left-sided anterior wall chest pain that waxes and wanes and remains unchanged.  Pain is worse with deep inspiration and coughing.  Pain is reproducible to her palpation.  She has been advised to have a trial of holding her Concerta though she did not feel like this helped.  No associated symptoms.  No exertional component.  She does not feel like the verapamil has helped.   Past Medical History:  Diagnosis Date  . Abnormal Pap smear   . Allergy   . 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 Asthma    exercised induced  . Depression   . GERD (gastroesophageal reflux disease)   . Headache(784.0)   . Mononucleosis 1998  . PONV (postoperative nausea and vomiting)    takes awhile to wake up    Past Surgical History:  Procedure Laterality Date  . CARPAL TUNNEL RELEASE Bilateral 2016  . CESAREAN SECTION N/A 10/30/2013   Procedure: CESAREAN SECTION;  Surgeon: Meriel Pica, MD;  Location: [facility];  Service: Obstetrics;  Laterality: N/A;  PRIMARY  . CHOLECYSTECTOMY N/A 10/24/2017   Procedure: LAPAROSCOPIC CHOLECYSTECTOMY;  Surgeon: Lattie Haw, MD;  Location: [facility] ORS;  Service: General;  Laterality: N/A;  . COLPOSCOPY W/ BIOPSY / CURETTAGE    . DE QUERVAIN'S RELEASE Left 2016  . DILATION AND CURETTAGE OF UTERUS    . FOOT FRACTURE SURGERY Left   . HAND SURGERY Left  Fracture repair  . IVF      Current Meds  Medication Sig  . acetaminophen (TYLENOL) 500 MG tablet Take 1,000-1,500 mg by mouth every 6 (six) hours as needed for moderate pain or headache.   . albuterol (PROAIR HFA) 108 (90 Base) MCG/ACT inhaler Inhale 2 puffs into the lungs every 6 (six) hours as needed for wheezing or shortness of breath.  Marland Kitchen. CALCIUM PO Take 1 tablet by mouth daily.  . Cholecalciferol (VITAMIN D PO) Take 2 capsules by mouth daily.   . fluticasone (FLONASE) 50 MCG/ACT nasal spray Place 1 spray into both nostrils at  bedtime.  Marland Kitchen. ibuprofen (ADVIL,MOTRIN) 200 MG tablet Take 800 mg by mouth every 6 (six) hours as needed for headache or moderate pain.  Marland Kitchen. lansoprazole (PREVACID) 15 MG capsule Take 15 mg by mouth daily.   Marland Kitchen. loratadine (CLARITIN) 10 MG tablet Take 10 mg by mouth daily as needed.   . magnesium oxide (MAG-OX) 400 (241.3 Mg) MG tablet TAKE 1 TABLET EVERY DAY (Patient taking differently: Take 400 mg by mouth once daily)  . methylphenidate 54 MG PO CR tablet Take 54 mg by mouth daily.  . Omega-3 Fatty Acids (FISH OIL BURP-LESS PO) Take 1 capsule by mouth daily.   . propranolol (INDERAL) 20 MG tablet Take 1 tablet (20 mg total) by mouth 3 (three) times daily as needed.  . rizatriptan (MAXALT) 10 MG tablet Take 10 mg by mouth daily as needed for migraine.   . verapamil (CALAN-SR) 120 MG CR tablet Take 1 tablet (120 mg total) by mouth at bedtime.    Allergies:   Patient has no known allergies.   Social History:  The patient  reports that she has never smoked. She has never used smokeless tobacco. She reports that she does not drink alcohol or use drugs.   Family History:  The patient's family history includes Alzheimer's disease in her maternal grandmother; Anemia in her sister; Arrhythmia in her sister; Arthritis in her father and mother; Bipolar disorder in her sister; Breast cancer in her paternal grandmother; Diabetes in her maternal grandmother; Diverticulitis in her mother; Emphysema in her maternal grandfather; GER disease in her mother; Healthy in her brother; Heart attack in her maternal grandmother and paternal grandfather; Heart disease in her paternal grandfather; Hyperlipidemia in her father; Hypertension in her father; Migraines in her mother; Thyroid disease in her father, mother, sister, and sister.  ROS:   Review of Systems  Constitutional: Positive for malaise/fatigue. Negative for chills, diaphoresis, fever and weight loss.  HENT: Negative for congestion.   Eyes: Negative for discharge  and redness.  Respiratory: Positive for shortness of breath. Negative for cough, hemoptysis, sputum production and wheezing.   Cardiovascular: Positive for chest pain. Negative for palpitations, orthopnea, claudication, leg swelling and PND.  Gastrointestinal: Negative for abdominal pain, blood in stool, heartburn, melena, nausea and vomiting.  Genitourinary: Negative for hematuria.  Musculoskeletal: Negative for falls and myalgias.  Skin: Negative for rash.  Neurological: Positive for weakness and headaches. Negative for dizziness, tingling, tremors, sensory change, speech change, focal weakness and loss of consciousness.  Endo/Heme/Allergies: Does not bruise/bleed easily.  Psychiatric/Behavioral: Negative for substance abuse. The patient is not nervous/anxious.   All other systems reviewed and are negative.    PHYSICAL EXAM:  VS:  BP 112/72 (BP Location: Left Arm, Patient Position: Sitting, Cuff Size: Normal)   Pulse 75   Ht 5\' 3"  (1.6 m)   Wt 177 lb 8 oz (80.5 kg)  BMI 31.44 kg/m  BMI: Body mass index is 31.44 kg/m.  Physical Exam  Constitutional: She is oriented to person, place, and time. She appears well-developed and well-nourished.  HENT:  Head: Normocephalic and atraumatic.  Eyes: Right eye exhibits no discharge. Left eye exhibits no discharge.  Neck: Normal range of motion. No JVD present.  Cardiovascular: Normal rate, regular rhythm, S1 normal, S2 normal and normal heart sounds. Exam reveals no distant heart sounds, no friction rub, no midsystolic click and no opening snap.  No murmur heard. Pulses:      Posterior tibial pulses are 2+ on the right side and 2+ on the left side.  Palpation the the upper left side anterior chest wall reproduces symptoms once pressure is released. Deep inspiration during pulmonary auscultation.   Pulmonary/Chest: Effort normal and breath sounds normal. No respiratory distress. She has no decreased breath sounds. She has no wheezes. She has no  rales. She exhibits no tenderness.  Abdominal: Soft. She exhibits no distension. There is no abdominal tenderness.  Musculoskeletal:        General: No edema.  Neurological: She is alert and oriented to person, place, and time.  Skin: Skin is warm and dry. No cyanosis. Nails show no clubbing.  Psychiatric: She has a normal mood and affect. Her speech is normal and behavior is normal. Judgment and thought content normal.     EKG:  Was not ordered today.  Recent Labs: 03/25/2018: ALT 16; TSH 1.11 12/23/2018: BUN 12; Creatinine, Ser 0.60; Hemoglobin 12.3; Platelets 343; Potassium 4.0; Sodium 136  03/05/2018: Cholesterol 212; HDL 67.30; LDL Cholesterol 127; Total CHOL/HDL Ratio 3; Triglycerides 86.0; VLDL 17.2   CrCl cannot be calculated (Patient's most recent lab result is older than the maximum 21 days allowed.).   Wt Readings from Last 3 Encounters:  01/20/19 177 lb 8 oz (80.5 kg)  12/28/18 172 lb (78 kg)  12/23/18 173 lb (78.5 kg)     Other studies reviewed: Additional studies/records reviewed today include: summarized above  ASSESSMENT AND PLAN:  1. Atypical chest pain: Symptoms concerning for musculoskeletal etiology, possible costochondritis.  Discontinue verapamil.  Recommend patient take ibuprofen 600 mg every 6 hours.  Check echo.  Cardiac work-up including cardiac enzymes, EKGs, cardiac monitoring, and treadmill Myoview have all been unrevealing with regard to possible cardiac etiology.  Could consider coronary CTA in follow-up though otherwise recommend follow-up with PCP.  2. SOB: Check echo.  Recent CBC unrevealing.Shanon Rosser exclude obesity and physical deconditioning.  Disposition: F/u with Dr. Mariah Milling in 1-2 months.   Current medicines are reviewed at length with the patient today.  The patient did not have any concerns regarding medicines.  Signed, Eula Listen, PA-C 01/20/2019 11:10 AM     [facility] - [place] [address] [place], [place]  [address] [phone]

## 2019-01-20 ENCOUNTER — Other Ambulatory Visit: Payer: Self-pay

## 2019-01-20 ENCOUNTER — Encounter: Payer: Self-pay | Admitting: Physician Assistant

## 2019-01-20 ENCOUNTER — Ambulatory Visit: Payer: BC Managed Care – PPO | Admitting: Physician Assistant

## 2019-01-20 VITALS — BP 112/72 | HR 75 | Ht 63.0 in | Wt 177.5 lb

## 2019-01-20 DIAGNOSIS — R0602 Shortness of breath: Secondary | ICD-10-CM | POA: Diagnosis not present

## 2019-01-20 DIAGNOSIS — R0789 Other chest pain: Secondary | ICD-10-CM

## 2019-01-20 NOTE — Patient Instructions (Signed)
Medication Instructions: Your physician has recommended you make the following change in your medication:  1-Take  Ibuprofen take 3 tablets (600 mg total) every 6 hours for 1 week.  2- Stop Verapamil  If you need a refill on your cardiac medications before your next appointment, please call your pharmacy.   Lab work: None ordered  If you have labs (blood work) drawn today and your tests are completely normal, you will receive your results only by: Marland Kitchen MyChart Message (if you have MyChart) OR . A paper copy in the mail If you have any lab test that is abnormal or we need to change your treatment, we will call you to review the results.  Testing/Procedures: 1- Echo Echo  Please return to [facility] on ______________ at _______________ AM/PM for an Echocardiogram. Your physician has requested that you have an echocardiogram. Echocardiography is a painless test that uses sound waves to create images of your heart. It provides your doctor with information about the size and shape of your heart and how well your heart's chambers and valves are working. This procedure takes approximately one hour. There are no restrictions for this procedure. Please note; depending on visual quality an IV may need to be placed.    Follow-Up: At [facility], you and your health needs are our priority.  As part of our continuing mission to provide you with exceptional heart care, we have created designated Provider Care Teams.  These Care Teams include your primary Cardiologist (physician) and Advanced Practice Providers (APPs -  Physician Assistants and Nurse Practitioners) who all work together to provide you with the care you need, when you need it. You will need a follow up appointment in 1-2 months. You may see Dr. Mariah Milling or Eula Listen, PA-C

## 2019-02-12 ENCOUNTER — Telehealth: Payer: Self-pay

## 2019-02-12 NOTE — Telephone Encounter (Signed)
-----   Message from Stann Mainland, RN sent at 02/12/2019 10:09 AM EDT ----- Regarding: Can reschedule echo  ----- Message ----- From: Sondra Barges, PA-C Sent: 02/12/2019   8:46 AM EDT To: [facility] Triage  Patient's echo can be rescheduled.

## 2019-02-12 NOTE — Telephone Encounter (Signed)
lmov to reschedule echo and to change OV to an Evisit.

## 2019-02-16 ENCOUNTER — Telehealth: Payer: Self-pay | Admitting: Internal Medicine

## 2019-02-16 NOTE — Telephone Encounter (Signed)
Virtual Visit Pre-Appointment Phone Call  Steps For Call:  1. Confirm consent - "In the setting of the current Covid19 crisis, you are scheduled for a (phone or video) visit with your provider on (date) at (time).  Just as we do with many in-office visits, in order for you to participate in this visit, we must obtain consent.  If you'd like, I can send this to your mychart (if signed up) or email for you to review.  Otherwise, I can obtain your verbal consent now.  All virtual visits are billed to your insurance company just like a normal visit would be.  By agreeing to a virtual visit, we'd like you to understand that the technology does not allow for your provider to perform an examination, and thus may limit your provider's ability to fully assess your condition.  Finally, though the technology is pretty good, we cannot assure that it will always work on either your or our end, and in the setting of a video visit, we may have to convert it to a phone-only visit.  In either situation, we cannot ensure that we have a secure connection.  Are you willing to proceed?"  2. Give patient instructions for WebEx download to smartphone as below if video visit  3. Advise patient to be prepared with any vital sign or heart rhythm information, their current medicines, and a piece of paper and pen handy for any instructions they may receive the day of their visit  4. Inform patient they will receive a phone call 15 minutes prior to their appointment time (may be from unknown caller ID) so they should be prepared to answer  5. Confirm that appointment type is correct in Epic appointment notes (video vs telephone)    TELEPHONE CALL NOTE  Carrie [name] has been deemed a candidate for a follow-up tele-health visit to limit community exposure during the Covid-19 pandemic. I spoke with the patient via phone to ensure availability of phone/video source, confirm preferred email & phone number, and discuss  instructions and expectations.  I reminded Carrie [name] to be prepared with any vital sign and/or heart rhythm information that could potentially be obtained via home monitoring, at the time of her visit. I reminded Carrie [name] to expect a phone call at the time of her visit if her visit.  Did the patient verbally acknowledge consent to treatment? Yes   Norman Herrlich 02/16/2019 2:44 PM   DOWNLOADING THE WEBEX SOFTWARE TO SMARTPHONE  - If Apple, go to [employer] and type in WebEx in the search bar. Download [employer], the blue/green circle. The app is free but as with any other app downloads, their phone may require them to verify saved payment information or Apple password. The patient does NOT have to create an account.  - If Android, ask patient to go to [employer] and type in WebEx in the search bar. Download [employer], the blue/green circle. The app is free but as with any other app downloads, their phone may require them to verify saved payment information or Android password. The patient does NOT have to create an account.   CONSENT FOR TELE-HEALTH VISIT - PLEASE REVIEW  I hereby voluntarily request, consent and authorize [facility] and its employed or contracted physicians, physician assistants, nurse practitioners or other licensed health care professionals (the Practitioner), to provide me with telemedicine health care services (the Services") as deemed necessary by the treating Practitioner. I acknowledge and consent  to receive the Services by the Practitioner via telemedicine. I understand that the telemedicine visit will involve communicating with the Practitioner through live audiovisual communication technology and the disclosure of certain medical information by electronic transmission. I acknowledge that I have been given the opportunity to request an in-person assessment or other available alternative prior to the telemedicine visit and am  voluntarily participating in the telemedicine visit.  I understand that I have the right to withhold or withdraw my consent to the use of telemedicine in the course of my care at any time, without affecting my right to future care or treatment, and that the Practitioner or I may terminate the telemedicine visit at any time. I understand that I have the right to inspect all information obtained and/or recorded in the course of the telemedicine visit and may receive copies of available information for a reasonable fee.  I understand that some of the potential risks of receiving the Services via telemedicine include:   Delay or interruption in medical evaluation due to technological equipment failure or disruption;  Information transmitted may not be sufficient (e.g. poor resolution of images) to allow for appropriate medical decision making by the Practitioner; and/or   In rare instances, security protocols could fail, causing a breach of personal health information.  Furthermore, I acknowledge that it is my responsibility to provide information about my medical history, conditions and care that is complete and accurate to the best of my ability. I acknowledge that Practitioner's advice, recommendations, and/or decision may be based on factors not within their control, such as incomplete or inaccurate data provided by me or distortions of diagnostic images or specimens that may result from electronic transmissions. I understand that the practice of medicine is not an exact science and that Practitioner makes no warranties or guarantees regarding treatment outcomes. I acknowledge that I will receive a copy of this consent concurrently upon execution via email to the email address I last provided but may also request a printed copy by calling the office of [facility].    I understand that my insurance will be billed for this visit.   I have read or had this consent read to me.  I understand the  contents of this consent, which adequately explains the benefits and risks of the Services being provided via telemedicine.   I have been provided ample opportunity to ask questions regarding this consent and the Services and have had my questions answered to my satisfaction.  I give my informed consent for the services to be provided through the use of telemedicine in my medical care  By participating in this telemedicine visit I agree to the above.

## 2019-02-16 NOTE — Telephone Encounter (Signed)
Rescheduled ECHO - office visit changed to video evisit

## 2019-02-17 ENCOUNTER — Other Ambulatory Visit: Payer: BC Managed Care – PPO

## 2019-02-26 NOTE — Progress Notes (Signed)
Virtual Visit via Video Note   This visit type was conducted due to national recommendations for restrictions regarding the COVID-19 Pandemic (e.g. social distancing) in an effort to limit this patient's exposure and mitigate transmission in our community.  Due to her co-morbid illnesses, this patient is at least at moderate risk for complications without adequate follow up.  This format is felt to be most appropriate for this patient at this time.  All issues noted in this document were discussed and addressed.  A limited physical exam was performed with this format.  Please refer to the patient's chart for her consent to telehealth for [facility].   Evaluation Performed:  Follow-up visit  Date:  03/02/2019   ID:  Carrie [name], DOB 09-09-1976, MRN [id number]  Patient Location: Home Provider Location: Office  PCP:  Dale Swan, MD  Cardiologist:  Julien Nordmann, MD  Electrophysiologist:  None   Chief Complaint:  Telehealth follow up  History of Present Illness:    Carrie [name] is a 43 y.o. female with history of chest pain, palpitations, anxiety, depression, exercise-induced asthma, and migraine disorder who presents for telehealth follow up for chest pain.   The patient has been previously seen by Dr. Mariah Milling in 05/2018 for palpitations with preceding 14-day event monitor showing no significant arrhythmia with PACs and PVCs noted. PRN propranolol was recommended. She was seen as an urgent visit by Dr. Okey Dupre on 12/23/2018 for chest pain that began 4 days prior to her visit that was described as pressure and a tight feeling just above the left breast and occasionally radiated to the center of her chest or to the left lateral chest. There were no exacerbating factors. The pain was rated an 8-9.5/10 and had progressed to being nearly constant. It was associated with SOB. The pain felt different than what she had experienced in the past with reflux. She was noted to have had a recent viral URI  that had resolved outside of an intermittent cough. She noted her chest pain improved when laying flat in our office for her EKG. She also noted a recent trip to the beach the weekend prior to her visit with Dr. Okey Dupre. In the office, her EKG showed NSR with isolated PVC and borderline LAE. Given her ongoing chest pain, she was sent to the ED. Work up in the ED on 2/12 showed troponin negative x 2, d dimer normal at 228.24, K+ 4.0, SCr 0.60, unremarkable CBC, influenza negative, CXR not acute. In office follow up with cardiology on 12/28/2018, she continued to note left-sided chest pain that was intermittent and not exacerbated by exertion and improved when laying supine. She was empirically started on verapamil and underwent treadmill Myoview on 12/29/2018 that showed no significant ischemia or scar, EF >65%. Low risk study. She was most recently seen in the office in 01/2019 for follow up and continued to note daily upper left-sided anterior wall chest pain that waxed and waned and remained unchanged. Her pain was worse with deep inspiration and coughing, and reproducible to her palpation. She did not feel like the verapamil had helped, leading to its discontinuation. She was advised to take empiric ibuprofen and echo was scheduled.   She is doing reasonably well from a cardiac perspective and follow-up today.  Since she was last seen, she reported complete resolution of her chest pain for approximately 1 to 2 weeks without intervention.  Unfortunately, over the past 1 week to week and a half she has noticed return of  her sharp upper left chest wall chest pain that is associated with laughing, coughing, or deep inspiration.  She is tender to palpation as well.  She denies any exertional chest pain.  There is some associated shortness of breath though she is uncertain if this is truly associated with the pain or just in the setting of she is scared to take a deep breath when she has it.  Otherwise, there are no  associated symptoms.  She is currently chest pain-free.  She does not feel like the ibuprofen helped much.  The patient does not have symptoms concerning for COVID-19 infection (fever, chills, cough, or new shortness of breath).    Past Medical History:  Diagnosis Date   Abnormal Pap smear    Allergy    Arthritis    t-12   Asthma    exercised induced   Depression    GERD (gastroesophageal reflux disease)    Headache(784.0)    Mononucleosis 1998   PONV (postoperative nausea and vomiting)    takes awhile to wake up   Past Surgical History:  Procedure Laterality Date   CARPAL TUNNEL RELEASE Bilateral 2016   CESAREAN SECTION N/A 10/30/2013   Procedure: CESAREAN SECTION;  Surgeon: Meriel Pica, MD;  Location: [facility];  Service: Obstetrics;  Laterality: N/A;  PRIMARY   CHOLECYSTECTOMY N/A 10/24/2017   Procedure: LAPAROSCOPIC CHOLECYSTECTOMY;  Surgeon: Lattie Haw, MD;  Location: [facility] ORS;  Service: General;  Laterality: N/A;   COLPOSCOPY W/ BIOPSY / CURETTAGE     DE QUERVAIN'S RELEASE Left 2016   DILATION AND CURETTAGE OF UTERUS     FOOT FRACTURE SURGERY Left    HAND SURGERY Left    Fracture repair   IVF       Current Meds  Medication Sig   acetaminophen (TYLENOL) 500 MG tablet Take 1,000-1,500 mg by mouth every 6 (six) hours as needed for moderate pain or headache.    albuterol (PROAIR HFA) 108 (90 Base) MCG/ACT inhaler Inhale 2 puffs into the lungs every 6 (six) hours as needed for wheezing or shortness of breath.   CALCIUM PO Take 1 tablet by mouth daily.   Cholecalciferol (VITAMIN D PO) Take 2 capsules by mouth daily.    fluticasone (FLONASE) 50 MCG/ACT nasal spray Place 1 spray into both nostrils at bedtime.   ibuprofen (ADVIL,MOTRIN) 200 MG tablet Take 600 mg by mouth every 6 (six) hours as needed for headache or moderate pain.   lansoprazole (PREVACID) 15 MG capsule Take 15 mg by mouth daily.    loratadine (CLARITIN) 10 MG tablet Take  10 mg by mouth daily as needed.    magnesium oxide (MAG-OX) 400 (241.3 Mg) MG tablet TAKE 1 TABLET EVERY DAY (Patient taking differently: Take 400 mg by mouth once daily)   methylphenidate 54 MG PO CR tablet Take 54 mg by mouth daily.   Omega-3 Fatty Acids (FISH OIL BURP-LESS PO) Take 1 capsule by mouth daily.    propranolol (INDERAL) 20 MG tablet Take 1 tablet (20 mg total) by mouth 3 (three) times daily as needed.   rizatriptan (MAXALT) 10 MG tablet Take 10 mg by mouth daily as needed for migraine.      Allergies:   Patient has no known allergies.   Social History   Tobacco Use   Smoking status: Never Smoker   Smokeless tobacco: Never Used  Substance Use Topics   Alcohol use: No   Drug use: No     Family Hx: The  patient's family history includes Alzheimer's disease in her maternal grandmother; Anemia in her sister; Arrhythmia in her sister; Arthritis in her father and mother; Bipolar disorder in her sister; Breast cancer in her paternal grandmother; Diabetes in her maternal grandmother; Diverticulitis in her mother; Emphysema in her maternal grandfather; GER disease in her mother; Healthy in her brother; Heart attack in her maternal grandmother and paternal grandfather; Heart disease in her paternal grandfather; Hyperlipidemia in her father; Hypertension in her father; Migraines in her mother; Thyroid disease in her father, mother, sister, and sister.  ROS:   Please see the history of present illness.     All other systems reviewed and are negative.   Prior CV studies:   The following studies were reviewed today:  Myoview 12/2018:  Normal pharmacologic myocardial perfusion stress test without significant ischemia or scar.  The left ventricular ejection fraction is hyperdynamic (>65%).  This is a low risk study. __________  Luci [facility] 04/2018: Normal sinus rhythm avg HR of 90 bpm.  Isolated SVEs were rare (<1.0%), SVE Couplets were rare (<1.0%), and no SVE Triplets were  present. Isolated VEs were rare (<1.0%), and no VE Couplets or VE Triplets were present.  Ventricular Bigeminy and Trigeminy were present. Patient triggered events appear to be associated with rare PVCs __________  2D Echo pending  Labs/Other Tests and Data Reviewed:    EKG:  No ECG reviewed.  Recent Labs: 03/25/2018: ALT 16; TSH 1.11 12/23/2018: BUN 12; Creatinine, Ser 0.60; Hemoglobin 12.3; Platelets 343; Potassium 4.0; Sodium 136   Recent Lipid Panel Lab Results  Component Value Date/Time   CHOL 212 (H) 03/05/2018 09:15 AM   TRIG 86.0 03/05/2018 09:15 AM   HDL 67.30 03/05/2018 09:15 AM   CHOLHDL 3 03/05/2018 09:15 AM   LDLCALC 127 (H) 03/05/2018 09:15 AM    Wt Readings from Last 3 Encounters:  03/02/19 172 lb (78 kg)  01/20/19 177 lb 8 oz (80.5 kg)  12/28/18 172 lb (78 kg)     Objective:    Vital Signs:  Ht 5\' 3"  (1.6 m)    Wt 172 lb (78 kg)    BMI 30.47 kg/m    VITAL SIGNS:  reviewed  ASSESSMENT & PLAN:    1. Atypical chest pain: Currently chest pain-free.  Symptoms are concerning for musculoskeletal etiology versus pulmonary in etiology.  She indicates that she did not feel like the ibuprofen helped much though while taking it she noted complete resolution of her chest pain.  I have advised her to continue ibuprofen as needed and to try a heat pack on her chest to see if this will help.  Patient's prior cardiac work-up including cardiac enzymes, EKGs, cardiac monitoring, and treadmill Myoview have all been unrevealing with regards to coronary insufficiency.  I do not think further ischemic evaluation is needed at this time.  2. Shortness of breath: She is uncertain if this is associated with her chest pain or just noted in the setting of she is scared to take a deep breath when she is having the chest pain.  Ischemic evaluation has been unrevealing as above.  Her echo has been postponed secondary to COVID-19 restrictions.  This has been rescheduled for 04/20/2019.  Should  she note worsening symptoms we can always bump this up.  Unable to exclude obesity and physical deconditioning.  COVID-19 Education: The signs and symptoms of COVID-19 were discussed with the patient and how to seek care for testing (follow up with PCP or arrange E-visit).  The importance of social distancing was discussed today.  Time:   Today, I have spent 20 minutes with the patient with telehealth technology discussing the above problems.     Medication Adjustments/Labs and Tests Ordered: Current medicines are reviewed at length with the patient today.  Concerns regarding medicines are outlined above.   Tests Ordered: No orders of the defined types were placed in this encounter.   Medication Changes: No orders of the defined types were placed in this encounter.   Disposition:  Follow up in 1 month(s)  Signed, Eula Listenyan Jeanette Rauth, PA-C  03/02/2019 3:22 PM    [facility]

## 2019-03-02 ENCOUNTER — Telehealth (INDEPENDENT_AMBULATORY_CARE_PROVIDER_SITE_OTHER): Payer: BC Managed Care – PPO | Admitting: Physician Assistant

## 2019-03-02 ENCOUNTER — Encounter: Payer: Self-pay | Admitting: Physician Assistant

## 2019-03-02 ENCOUNTER — Ambulatory Visit: Payer: BC Managed Care – PPO | Admitting: Physician Assistant

## 2019-03-02 ENCOUNTER — Other Ambulatory Visit: Payer: Self-pay

## 2019-03-02 VITALS — Ht 63.0 in | Wt 172.0 lb

## 2019-03-02 DIAGNOSIS — R0789 Other chest pain: Secondary | ICD-10-CM | POA: Diagnosis not present

## 2019-03-02 DIAGNOSIS — R0602 Shortness of breath: Secondary | ICD-10-CM

## 2019-03-02 NOTE — Patient Instructions (Signed)
It was a pleasure to speak with you on the phone today! Thank you for allowing [place] to continue taking care of your [facility] needs during this time.   Feel free to call as needed for questions and concerns related to your cardiac needs.   Medication Instructions:  Your physician recommends that you continue on your current medications as directed. Please refer to the Current Medication list given to you today.  If you need a refill on your cardiac medications before your next appointment, please call your pharmacy.   Lab work: None ordered If you have labs (blood work) drawn today and your tests are completely normal, you will receive your results only by: Marland Kitchen MyChart Message (if you have MyChart) OR . A paper copy in the mail If you have any lab test that is abnormal or we need to change your treatment, we will call you to review the results.  Testing/Procedures: None ordered  Follow-Up: At [facility], you and your health needs are our priority.  As part of our continuing mission to provide you with exceptional heart care, we have created designated Provider Care Teams.  These Care Teams include your primary Cardiologist (physician) and Advanced Practice Providers (APPs -  Physician Assistants and Nurse Practitioners) who all work together to provide you with the care you need, when you need it. You will need a follow up appointment in 1 months. You may see Julien Nordmann, MD or Eula Listen, PA-C.  Any Other Special Instructions Will Be Listed Below (If Applicable). 1- Please apply heating pad to chest area for discomfort as needed.

## 2019-03-03 ENCOUNTER — Telehealth: Payer: Self-pay | Admitting: Physician Assistant

## 2019-03-03 NOTE — Telephone Encounter (Signed)
L MOM to call and schedule 1 month f/u virtual appointment

## 2019-03-10 ENCOUNTER — Encounter: Payer: Self-pay | Admitting: Internal Medicine

## 2019-03-12 ENCOUNTER — Encounter: Payer: BC Managed Care – PPO | Admitting: Internal Medicine

## 2019-03-15 ENCOUNTER — Telehealth: Payer: Self-pay | Admitting: *Deleted

## 2019-03-15 ENCOUNTER — Other Ambulatory Visit: Payer: Self-pay | Admitting: Internal Medicine

## 2019-03-15 ENCOUNTER — Encounter: Payer: Self-pay | Admitting: *Deleted

## 2019-03-15 DIAGNOSIS — J0140 Acute pansinusitis, unspecified: Secondary | ICD-10-CM

## 2019-03-15 NOTE — Telephone Encounter (Signed)
Sent myhcart message for pt to reschedule appt & swtich to virtual visit.

## 2019-03-17 NOTE — Telephone Encounter (Signed)
Copied from CRM [id number]. Topic: Appointment Scheduling - Scheduling Inquiry for Clinic >> Mar 17, 2019  9:24 AM Leafy Ro wrote: Reason for CRM: pt is calling and would like to reschedule physical with dr scott for 03-12-2019

## 2019-03-17 NOTE — Telephone Encounter (Signed)
Called and scheduled a appointment for a physical with Dr. Lorin Picket, pt has appt date and time.  Nina,.cma

## 2019-03-19 ENCOUNTER — Ambulatory Visit (INDEPENDENT_AMBULATORY_CARE_PROVIDER_SITE_OTHER): Payer: BC Managed Care – PPO | Admitting: Internal Medicine

## 2019-03-19 ENCOUNTER — Other Ambulatory Visit: Payer: Self-pay

## 2019-03-19 ENCOUNTER — Encounter: Payer: Self-pay | Admitting: Internal Medicine

## 2019-03-19 DIAGNOSIS — R079 Chest pain, unspecified: Secondary | ICD-10-CM | POA: Diagnosis not present

## 2019-03-19 DIAGNOSIS — S92322G Displaced fracture of second metatarsal bone, left foot, subsequent encounter for fracture with delayed healing: Secondary | ICD-10-CM | POA: Diagnosis not present

## 2019-03-19 DIAGNOSIS — N644 Mastodynia: Secondary | ICD-10-CM

## 2019-03-19 DIAGNOSIS — G43019 Migraine without aura, intractable, without status migrainosus: Secondary | ICD-10-CM

## 2019-03-19 DIAGNOSIS — F419 Anxiety disorder, unspecified: Secondary | ICD-10-CM

## 2019-03-19 DIAGNOSIS — E7800 Pure hypercholesterolemia, unspecified: Secondary | ICD-10-CM

## 2019-03-19 DIAGNOSIS — K219 Gastro-esophageal reflux disease without esophagitis: Secondary | ICD-10-CM | POA: Diagnosis not present

## 2019-03-19 NOTE — Progress Notes (Signed)
Patient ID: Carrie [name], female   DOB: May 26, 1976, 43 y.o.   MRN: [id number]   Virtual Visit via Video Note  This visit type was conducted due to national recommendations for restrictions regarding the COVID-19 pandemic (e.g. social distancing).  This format is felt to be most appropriate for this patient at this time.  All issues noted in this document were discussed and addressed.  No physical exam was performed (except for noted visual exam findings with Video Visits).   I connected with Grainne Schlarb by a video enabled telemedicine application and verified that I am speaking with the correct person using two identifiers. Location patient: work Location provider: work  Persons participating in the virtual visit: patient, provider  I discussed the limitations, risks, security and privacy concerns of performing an evaluation and management service by video and the availability of in person appointments. The patient expressed understanding and agreed to proceed.   Reason for visit: scheduled follow up.    HPI: She has been evaluated recently by cardiology for anterior chest pain.  She was last seen 03/02/19.  Has had event monitor - PACs and PVCs.  Given rx for prn propranolol.  Had treadmill myoview - negative for ischemia.  Planning for ECHO 04/20/19.  CXR negative. She still feels pressure/discomfort - left anterior chest.  Localized over the superior portion of her left breast.  When she presses on the area - feels pressure.  No palpitations now.  Tries to stay active.  Worse on some days.  No sob.  Some acid reflux.  Takes prevacid.  On 15mg  q day.  No nausea or vomiting.  Bowels moving.  No urine change.  Has seen Dr Sherryll Burger for her headaches. S/p block.  Helped.  Has f/u scheduled Monday 03/22/19.  Sees Dr Maryruth Bun.  On Concerta.  Increased stress.  Has good support. Overall she feels she is handling things relatively well.  Does not feel needs any further intervention.   ROS: See pertinent positives  and negatives per HPI.  Past Medical History:  Diagnosis Date  . Abnormal Pap smear   . Allergy   . 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 Asthma    exercised induced  . Depression   . GERD (gastroesophageal reflux disease)   . Headache(784.0)   . Mononucleosis 1998  . PONV (postoperative nausea and vomiting)    takes awhile to wake up    Past Surgical History:  Procedure Laterality Date  . CARPAL TUNNEL RELEASE Bilateral 2016  . CESAREAN SECTION N/A 10/30/2013   Procedure: CESAREAN SECTION;  Surgeon: Meriel Pica, MD;  Location: [facility];  Service: Obstetrics;  Laterality: N/A;  PRIMARY  . CHOLECYSTECTOMY N/A 10/24/2017   Procedure: LAPAROSCOPIC CHOLECYSTECTOMY;  Surgeon: Lattie Haw, MD;  Location: [facility] ORS;  Service: General;  Laterality: N/A;  . COLPOSCOPY W/ BIOPSY / CURETTAGE    . DE QUERVAIN'S RELEASE Left 2016  . DILATION AND CURETTAGE OF UTERUS    . FOOT FRACTURE SURGERY Left   . HAND SURGERY Left    Fracture repair  . IVF      Family History  Problem Relation Age of Onset  . Arthritis Mother   . Diverticulitis Mother   . Thyroid disease Mother   . GER disease Mother   . Migraines Mother   . Arthritis Father   . Hypertension Father   . Hyperlipidemia Father   . Thyroid disease Father   . Diabetes Maternal Grandmother   . Heart attack  Maternal Grandmother   . Alzheimer's disease Maternal Grandmother   . Heart disease Paternal Grandfather   . Heart attack Paternal Grandfather   . Anemia Sister   . Thyroid disease Sister   . Healthy Brother   . Emphysema Maternal Grandfather   . Breast cancer Paternal Grandmother   . Thyroid disease Sister   . Bipolar disorder Sister   . Arrhythmia Sister        Ventricular Tachycardia    SOCIAL HX: reviewed.    Current Outpatient Medications:  .  acetaminophen (TYLENOL) 500 MG tablet, Take 1,000-1,500 mg by mouth every 6 (six) hours as needed for moderate pain or headache. , Disp: , Rfl:  .  albuterol (PROAIR HFA) 108  (90 Base) MCG/ACT inhaler, Inhale 2 puffs into the lungs every 6 (six) hours as needed for wheezing or shortness of breath., Disp: 1 Inhaler, Rfl: 2 .  CALCIUM PO, Take 1 tablet by mouth daily., Disp: , Rfl:  .  Cholecalciferol (VITAMIN D PO), Take 2 capsules by mouth daily. , Disp: , Rfl:  .  fluticasone (FLONASE) 50 MCG/ACT nasal spray, PLACE 1 SPRAY INTO BOTH NOSTRILS AT BEDTIME., Disp: 48 g, Rfl: 2 .  ibuprofen (ADVIL,MOTRIN) 200 MG tablet, Take 600 mg by mouth every 6 (six) hours as needed for headache or moderate pain., Disp: , Rfl:  .  lansoprazole (PREVACID) 15 MG capsule, Take 15 mg by mouth daily. , Disp: , Rfl:  .  loratadine (CLARITIN) 10 MG tablet, Take 10 mg by mouth daily as needed. , Disp: , Rfl:  .  magnesium oxide (MAG-OX) 400 (241.3 Mg) MG tablet, TAKE 1 TABLET EVERY DAY (Patient taking differently: Take 400 mg by mouth once daily), Disp: 30 tablet, Rfl: 1 .  methylphenidate 54 MG PO CR tablet, Take 54 mg by mouth daily., Disp: , Rfl:  .  Omega-3 Fatty Acids (FISH OIL BURP-LESS PO), Take 1 capsule by mouth daily. , Disp: , Rfl:  .  propranolol (INDERAL) 20 MG tablet, Take 1 tablet (20 mg total) by mouth 3 (three) times daily as needed., Disp: 90 tablet, Rfl: 3 .  rizatriptan (MAXALT) 10 MG tablet, Take 10 mg by mouth daily as needed for migraine. , Disp: , Rfl:   EXAM:  GENERAL: alert, oriented, appears well and in no acute distress  HEENT: atraumatic, conjunttiva clear, no obvious abnormalities on inspection of external nose and ears  NECK: normal movements of the head and neck  LUNGS: on inspection no signs of respiratory distress, breathing rate appears normal, no obvious gross SOB, gasping or wheezing  CV: no obvious cyanosis  MS: minimal pressure sensation when pressing on her left anterior breast.    PSYCH/NEURO: pleasant and cooperative, no obvious depression or anxiety, speech and thought processing grossly intact  ASSESSMENT AND PLAN:  Discussed the  following assessment and plan:  Gastroesophageal reflux disease, esophagitis presence not specified  Anxiety  Chest pain, unspecified type - Plan: TSH, T4, free, T3, free, Sedimentation rate, Insulin and C-Peptide, T3, reverse  Closed displaced fracture of second metatarsal bone of left foot with delayed healing, subsequent encounter  Common migraine with intractable migraine  Hypercholesteremia - Plan: CBC with Differential/Platelet, Hepatic function panel, Lipid panel, Basic metabolic panel  Breast pain, left - Plan: MM DIAG BREAST TOMO BILATERAL, US BREAST LTD UNI LEFT INC AXILLA  Acid reflux On prevacid.  Increase to 30mg  q day.  Follow.    Anxiety Followed by Dr Maryruth BunKapur.  Some increased stress recently.  Has good support. Does not feel needs any further intervention.  Follow.   Chest pain Has had chest pain as outlined.  Cardiac w/up as outlined.  Negative treadmill myoview.  Event monitor - PACs/PVCs.  Given rx for prn propranolol.  cxr negative.  Has ECHO planned 04/20/19.  Treat acid reflux.  Obtain mammogram.    Closed displaced fracture of second metatarsal bone of left foot Has been followed by [facility].   Common migraine with intractable migraine S/p SPG block. Did well with this.  Sees Dr Sherryll Burger. Has f/u planned Monday.    Hypercholesteremia Low cholesterol diet and exercise.  Follow lipid panel.    Breast pain, left The chest pain is localized over her left breast.  No rash.  Will obtain diagnostic mammogram.  Overdue.  Follow.      I discussed the assessment and treatment plan with the patient. The patient was provided an opportunity to ask questions and all were answered. The patient agreed with the plan and demonstrated an understanding of the instructions.   The patient was advised to call back or seek an in-person evaluation if the symptoms worsen or if the condition fails to improve as anticipated.  I provided 30 minutes of non-face-to-face time during this  encounter.   Dale [place], MD

## 2019-03-21 ENCOUNTER — Encounter: Payer: Self-pay | Admitting: Internal Medicine

## 2019-03-21 DIAGNOSIS — N644 Mastodynia: Secondary | ICD-10-CM | POA: Insufficient documentation

## 2019-03-21 NOTE — Assessment & Plan Note (Signed)
The chest pain is localized over her left breast.  No rash.  Will obtain diagnostic mammogram.  Overdue.  Follow.

## 2019-03-21 NOTE — Assessment & Plan Note (Signed)
Followed by Dr Maryruth Bun.  Some increased stress recently.  Has good support. Does not feel needs any further intervention.  Follow.

## 2019-03-21 NOTE — Assessment & Plan Note (Signed)
On prevacid.  Increase to 30mg  q day.  Follow.

## 2019-03-21 NOTE — Assessment & Plan Note (Signed)
Has had chest pain as outlined.  Cardiac w/up as outlined.  Negative treadmill myoview.  Event monitor - PACs/PVCs.  Given rx for prn propranolol.  cxr negative.  Has ECHO planned 04/20/19.  Treat acid reflux.  Obtain mammogram.

## 2019-03-21 NOTE — Assessment & Plan Note (Signed)
Has been followed by [facility].

## 2019-03-21 NOTE — Assessment & Plan Note (Signed)
S/p SPG block. Did well with this.  Sees Dr Sherryll Burger. Has f/u planned Monday.

## 2019-03-21 NOTE — Assessment & Plan Note (Signed)
Low cholesterol diet and exercise.  Follow lipid panel.   

## 2019-03-22 ENCOUNTER — Encounter: Payer: Self-pay | Admitting: Internal Medicine

## 2019-03-22 ENCOUNTER — Other Ambulatory Visit (INDEPENDENT_AMBULATORY_CARE_PROVIDER_SITE_OTHER): Payer: BC Managed Care – PPO

## 2019-03-22 ENCOUNTER — Other Ambulatory Visit: Payer: Self-pay

## 2019-03-22 DIAGNOSIS — E7800 Pure hypercholesterolemia, unspecified: Secondary | ICD-10-CM | POA: Diagnosis not present

## 2019-03-22 DIAGNOSIS — R079 Chest pain, unspecified: Secondary | ICD-10-CM

## 2019-03-22 LAB — BASIC METABOLIC PANEL
BUN: 17 mg/dL (ref 6–23)
CO2: 27 mEq/L (ref 19–32)
Calcium: 9.3 mg/dL (ref 8.4–10.5)
Chloride: 103 mEq/L (ref 96–112)
Creatinine, Ser: 0.71 mg/dL (ref 0.40–1.20)
GFR: 90.19 mL/min (ref >60.00)
Glucose, Bld: 93 mg/dL (ref 70–99)
Potassium: 4.3 mEq/L (ref 3.5–5.1)
Sodium: 138 mEq/L (ref 135–145)

## 2019-03-22 LAB — CBC WITH DIFFERENTIAL/PLATELET
Basophils Absolute: 0 10*3/uL (ref 0.0–0.1)
Basophils Relative: 0.3 % (ref 0.0–3.0)
Eosinophils Absolute: 0 10*3/uL (ref 0.0–0.7)
Eosinophils Relative: 0.8 % (ref 0.0–5.0)
HCT: 37.8 % (ref 36.0–46.0)
Hemoglobin: 12.7 g/dL (ref 12.0–15.0)
Lymphocytes Relative: 42.2 % (ref 12.0–46.0)
Lymphs Abs: 2.6 10*3/uL (ref 0.7–4.0)
MCHC: 33.7 g/dL (ref 30.0–36.0)
MCV: 88.9 fl (ref 78.0–100.0)
Monocytes Absolute: 0.4 10*3/uL (ref 0.1–1.0)
Monocytes Relative: 6 % (ref 3.0–12.0)
Neutro Abs: 3.1 10*3/uL (ref 1.4–7.7)
Neutrophils Relative %: 50.7 % (ref 43.0–77.0)
Platelets: 309 10*3/uL (ref 150.0–400.0)
RBC: 4.25 Mil/uL (ref 3.87–5.11)
RDW: 13.8 % (ref 11.5–15.5)
WBC: 6.2 10*3/uL (ref 4.0–10.5)

## 2019-03-22 LAB — HEPATIC FUNCTION PANEL
ALT: 18 U/L (ref 0–35)
AST: 18 U/L (ref 0–37)
Albumin: 4.5 g/dL (ref 3.5–5.2)
Alkaline Phosphatase: 42 U/L (ref 39–117)
Bilirubin, Direct: 0.1 mg/dL (ref 0.0–0.3)
Total Bilirubin: 0.4 mg/dL (ref 0.2–1.2)
Total Protein: 7.1 g/dL (ref 6.0–8.3)

## 2019-03-22 LAB — T3, FREE: T3, Free: 3.4 pg/mL (ref 2.3–4.2)

## 2019-03-22 LAB — LIPID PANEL
Cholesterol: 229 mg/dL — ABNORMAL HIGH (ref 0–200)
HDL: 70.1 mg/dL (ref >39.00)
LDL Cholesterol: 143 mg/dL — ABNORMAL HIGH (ref 0–99)
NonHDL: 159.36
Total CHOL/HDL Ratio: 3
Triglycerides: 84 mg/dL (ref 0.0–149.0)
VLDL: 16.8 mg/dL (ref 0.0–40.0)

## 2019-03-22 LAB — SEDIMENTATION RATE: Sed Rate: 6 mm/hr (ref 0–20)

## 2019-03-22 LAB — TSH: TSH: 1.8 u[IU]/mL (ref 0.35–4.50)

## 2019-03-22 LAB — T4, FREE: Free T4: 0.78 ng/dL (ref 0.60–1.60)

## 2019-03-23 LAB — INSULIN AND C-PEPTIDE, SERUM
C-Peptide: 2.3 ng/mL (ref 1.1–4.4)
INSULIN: 14.6 u[IU]/mL (ref 2.6–24.9)

## 2019-03-25 LAB — T3, REVERSE: T3, Reverse: 12 ng/dL (ref 8–25)

## 2019-04-16 ENCOUNTER — Ambulatory Visit: Payer: Self-pay | Admitting: Internal Medicine

## 2019-04-16 NOTE — Telephone Encounter (Signed)
Pt. Has been exposed to co-worker who tested positive for COVID 19. Would like a virtual visit with her PCP. No availability on scheduling line. Please advise pt. [phone]. Answer Assessment - Initial Assessment Questions 1. CLOSE CONTACT: "Who is the person with the confirmed or suspected COVID-19 infection that you were exposed to?"     Co-worker 2. PLACE of CONTACT: "Where were you when you were exposed to COVID-19?" (e.g., home, school, medical waiting room; which city?)     Wwork 3. TYPE of CONTACT: "How much contact was there?" (e.g., sitting next to, live in same house, work in same office, same building)     1-2 days 4. DURATION of CONTACT: "How long were you in contact with the COVID-19 patient?" (e.g., a few seconds, passed by person, a few minutes, live with the patient)     1-3 days 5. DATE of CONTACT: "When did you have contact with a COVID-19 patient?" (e.g., how many days ago)     Monday and Tues. 6. TRAVEL: "Have you traveled out of the country recently?" If so, "When and where?"     * Also ask about out-of-state travel, since the CDC has identified some high-risk cities for community spread in the [place].     * Note: Travel becomes less relevant if there is widespread community transmission where the patient lives.     No 7. COMMUNITY SPREAD: "Are there lots of cases of COVID-19 (community spread) where you live?" (See public health department website, if unsure)       Yes 8. SYMPTOMS: "Do you have any symptoms?" (e.g., fever, cough, breathing difficulty)     Headache 9. PREGNANCY OR POSTPARTUM: "Is there any chance you are pregnant?" "When was your last menstrual period?" "Did you deliver in the last 2 weeks?"     No 10. HIGH RISK: "Do you have any heart or lung problems? Do you have a weak immune system?" (e.g., CHF, COPD, asthma, HIV positive, chemotherapy, renal failure, diabetes mellitus, sickle cell anemia)       Heart  Protocols used: CORONAVIRUS (COVID-19)  EXPOSURE-A-AH

## 2019-04-19 ENCOUNTER — Other Ambulatory Visit: Payer: BC Managed Care – PPO

## 2019-04-19 ENCOUNTER — Telehealth: Payer: Self-pay | Admitting: *Deleted

## 2019-04-19 ENCOUNTER — Ambulatory Visit: Payer: Self-pay | Admitting: Internal Medicine

## 2019-04-19 DIAGNOSIS — Z20822 Contact with and (suspected) exposure to covid-19: Secondary | ICD-10-CM

## 2019-04-19 NOTE — Telephone Encounter (Signed)
Pt scheduled for Covid 19 testing at [facility] site for today. Requested by [facility] Dept. and  Dr.[name]. Pt with possible exposure. Reviewed testing process; pt verbalizes understanding.

## 2019-04-19 NOTE — Telephone Encounter (Signed)
I received a call from  Thayer Headings, nurse with [facility] Dept. Requesting testing for COVID-19 for this pt. 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     I called pt and left a message on her answering machine to call us back at [phone] to be scheduled for COVID-19.   Any nurse could assist her just let them know you are returning a call to be scheduled for COVID-19.

## 2019-04-19 NOTE — Telephone Encounter (Signed)
Health dept has contacted her again regarding ordering test. They are supposed to be letting her know when to go over to [facility]. Pt would like to wait on virtual appt right now.

## 2019-04-19 NOTE — Telephone Encounter (Signed)
Copied from [place] [phone]. Topic: General - Other >> Apr 16, 2019  4:53 PM Rainey Pines A wrote: Patient is still waiting to hear back on a order to get COVID testing since a coworker has exposed her. Patient would like a callback.

## 2019-04-19 NOTE — Telephone Encounter (Signed)
Pt has a coworker that was tested for COVID on Wednesday. Test was postive on Friday. Patient has been contacted by the health dept. They are recommending that she get tested. Pt complaining of headache, feeling tired, and diarrhea on Friday. She is not sure that those symptoms were related to COVID because she just started on new medication and all of those are side effects. Otherwise feeling ok, confirmed no other acute symptoms at this time.

## 2019-04-19 NOTE — Telephone Encounter (Signed)
I am ok to order test.  Can set up doxy to discuss symptoms and recommendations, etc.  I can work her in at 12:30 today.

## 2019-04-19 NOTE — Telephone Encounter (Signed)
See other note

## 2019-04-20 ENCOUNTER — Other Ambulatory Visit: Payer: BC Managed Care – PPO

## 2019-04-21 LAB — NOVEL CORONAVIRUS, NAA: SARS-CoV-2, NAA: NOT DETECTED

## 2019-04-23 ENCOUNTER — Telehealth: Payer: BC Managed Care – PPO | Admitting: Cardiovascular Disease

## 2019-05-05 ENCOUNTER — Telehealth: Payer: Self-pay

## 2019-05-05 DIAGNOSIS — N644 Mastodynia: Secondary | ICD-10-CM

## 2019-05-05 NOTE — Telephone Encounter (Signed)
Order placed for us

## 2019-05-10 ENCOUNTER — Telehealth: Payer: Self-pay | Admitting: Cardiovascular Disease

## 2019-05-10 NOTE — Telephone Encounter (Signed)

## 2019-05-11 ENCOUNTER — Ambulatory Visit (INDEPENDENT_AMBULATORY_CARE_PROVIDER_SITE_OTHER): Payer: BC Managed Care – PPO

## 2019-05-11 ENCOUNTER — Other Ambulatory Visit: Payer: Self-pay

## 2019-05-11 DIAGNOSIS — R0602 Shortness of breath: Secondary | ICD-10-CM

## 2019-05-11 DIAGNOSIS — R0789 Other chest pain: Secondary | ICD-10-CM | POA: Diagnosis not present

## 2019-05-12 ENCOUNTER — Telehealth: Payer: Self-pay

## 2019-05-12 ENCOUNTER — Telehealth: Payer: BC Managed Care – PPO | Admitting: Nurse Practitioner

## 2019-05-12 DIAGNOSIS — L237 Allergic contact dermatitis due to plants, except food: Secondary | ICD-10-CM | POA: Diagnosis not present

## 2019-05-12 MED ORDER — PREDNISONE 10 MG (21) PO TBPK: ORAL_TABLET | ORAL | 0 refills | Status: DC

## 2019-05-12 NOTE — Telephone Encounter (Signed)
-----   Message from Rise Mu, PA-C sent at 05/12/2019  7:15 AM EDT ----- Echo showed normal pump function, normal relaxation of the heart, normal wall motion, normal pressure in the right side, systolic bowing of the mitral valve without prolapse with trivially leaky mitral valve, normal in size and structure aortic root and ascending aorta.  Overall reassuring echo.

## 2019-05-12 NOTE — Telephone Encounter (Signed)
Pt made aware of echo results with verbal understanding. 

## 2019-05-12 NOTE — Addendum Note (Signed)
Addended by: Evelina Dun A on: 05/12/2019 04:51 PM   Modules accepted: Orders

## 2019-05-12 NOTE — Progress Notes (Signed)
E Visit for Rash  We are sorry that you are not feeling well. Here is how we plan to help!  Based on what you shared with me it looks like you have contact dermatitis.  Contact dermatitis is a skin rash caused by something that touches the skin and causes irritation or inflammation.  Your skin may be red, swollen, dry, cracked, and itch.  The rash should go away in a few days but can last a few weeks.  If you get a rash, it's important to figure out what caused it so the irritant can be avoided in the future.      Prednisone 10 mg daily for 6 days (see taper instructions below)  Directions for 6 day taper: Day 1: 2 tablets before breakfast, 1 after both lunch & dinner and 2 at bedtime Day 2: 1 tab before breakfast, 1 after both lunch & dinner and 2 at bedtime Day 3: 1 tab at each meal & 1 at bedtime Day 4: 1 tab at breakfast, 1 at lunch, 1 at bedtime Day 5: 1 tab at breakfast & 1 tab at bedtime Day 6: 1 tab at breakfast    HOME CARE:   Take cool showers and avoid direct sunlight.  Apply cool compress or wet dressings.  Take a bath in an oatmeal bath.  Sprinkle content of one Aveeno packet under running faucet with comfortably warm water.  Bathe for 15-20 minutes, 1-2 times daily.  Pat dry with a towel. Do not rub the rash.  Use hydrocortisone cream.  Take an antihistamine like Benadryl for widespread rashes that itch.  The adult dose of Benadryl is 25-50 mg by mouth 4 times daily.  Caution:  This type of medication may cause sleepiness.  Do not drink alcohol, drive, or operate dangerous machinery while taking antihistamines.  Do not take these medications if you have prostate enlargement.  Read package instructions thoroughly on all medications that you take.  GET HELP RIGHT AWAY IF:   Symptoms don't go away after treatment.  Severe itching that persists.  If you rash spreads or swells.  If you rash begins to smell.  If it blisters and opens or develops a yellow-brown  crust.  You develop a fever.  You have a sore throat.  You become short of breath.  MAKE SURE YOU:  Understand these instructions. Will watch your condition. Will get help right away if you are not doing well or get worse.  Thank you for choosing an e-visit. Your e-visit answers were reviewed by a board certified advanced clinical practitioner to complete your personal care plan. Depending upon the condition, your plan could have included both over the counter or prescription medications. Please review your pharmacy choice. Be sure that the pharmacy you have chosen is open so that you can pick up your prescription now.  If there is a problem you may message your provider in MyChart to have the prescription routed to another pharmacy. Your safety is important to us. If you have drug allergies check your prescription carefully.   For the next 24 hours, you can use MyChart to ask questions about today's visit, request a non-urgent call back, or ask for a work or school excuse from your e-visit provider. You will get an email in the next two days asking about your experience. I hope that your e-visit has been valuable and will speed your recovery.    5-10 minutes spent reviewing and documenting in chart.   

## 2019-05-18 NOTE — Progress Notes (Signed)
Cardiology Office Note  Date:  05/19/2019   ID:  Carrie [name], DOB 02/03/1976, MRN [id number]  PCP:  Carrie [name], Charlene, MD   Chief Complaint  Patient presents with  . other    1 month follow up post ECHO. Meds reviewed verbally with patient.     HPI:  Carrie [name] is a 43 year old woman with past medical history of migraine Anxiety, depression Presents for f/u of her palpitations, and chronic chest pain  Previously seen by our office for chest pain symptoms Stress test showing no ischemia February 2020 Echocardiogram essentially normal May 11, 2019 Results discussed with her in detail  Describes the symptoms as sharp, sometimes positional sharp upper left chest wall chest pain that is associated with laughing, coughing, or deep inspiration tender to palpation  Noticed the symptoms during echocardiogram with probe pushed against the chest  Pain left chest , radiating, was better on SSRI, got hives Some positional component,   EKG personally reviewed by myself on todays visit Shows NSR with no ST or T wave changes rate 77 bpm  Other results available Event monitor  rare PVC  associated with her symptoms  no other significant tachycardia or arrhythmia  Family hx Sister with ectopy  PMH:   has a past medical history of Abnormal Pap smear, Allergy, Arthritis, Asthma, Depression, GERD (gastroesophageal reflux disease), Headache(784.0), Mononucleosis (1998), and PONV (postoperative nausea and vomiting).  PSH:    Past Surgical History:  Procedure Laterality Date  . CARPAL TUNNEL RELEASE Bilateral 2016  . CESAREAN SECTION N/A 10/30/2013   Procedure: CESAREAN SECTION;  Surgeon: Carrie [name] M Holland, MD;  Location: [facility];  Service: Obstetrics;  Laterality: N/A;  PRIMARY  . CHOLECYSTECTOMY N/A 10/24/2017   Procedure: LAPAROSCOPIC CHOLECYSTECTOMY;  Surgeon: Carrie [name], Carrie E, MD;  Location: [facility] ORS;  Service: General;  Laterality: N/A;  . COLPOSCOPY W/ BIOPSY / CURETTAGE    . DE  QUERVAIN'S RELEASE Left 2016  . DILATION AND CURETTAGE OF UTERUS    . FOOT FRACTURE SURGERY Left   . HAND SURGERY Left    Fracture repair  . IVF      Current Outpatient Medications  Medication Sig Dispense Refill  . acetaminophen (TYLENOL) 500 MG tablet Take 1,000-1,500 mg by mouth every 6 (six) hours as needed for moderate pain or headache.     . albuterol (PROAIR HFA) 108 (90 Base) MCG/ACT inhaler Inhale 2 puffs into the lungs every 6 (six) hours as needed for wheezing or shortness of breath. 1 Inhaler 2  . CALCIUM PO Take 1 tablet by mouth daily.    . Cholecalciferol (VITAMIN D PO) Take 2 capsules by mouth daily.     . fluticasone (FLONASE) 50 MCG/ACT nasal spray PLACE 1 SPRAY INTO BOTH NOSTRILS AT BEDTIME. 48 g 2  . ibuprofen (ADVIL,MOTRIN) 200 MG tablet Take 600 mg by mouth every 6 (six) hours as needed for headache or moderate pain.    Marland Kitchen. lansoprazole (PREVACID) 15 MG capsule Take 15 mg by mouth daily.     Marland Kitchen. loratadine (CLARITIN) 10 MG tablet Take 10 mg by mouth daily as needed.     . magnesium oxide (MAG-OX) 400 (241.3 Mg) MG tablet TAKE 1 TABLET EVERY DAY (Patient taking differently: Take 400 mg by mouth once daily) 30 tablet 1  . methylphenidate 54 MG PO CR tablet Take 54 mg by mouth daily.    . Omega-3 Fatty Acids (FISH OIL BURP-LESS PO) Take 1 capsule by mouth daily.     .Marland Kitchen  propranolol (INDERAL) 20 MG tablet Take 1 tablet (20 mg total) by mouth 3 (three) times daily as needed. 90 tablet 3  . rizatriptan (MAXALT) 10 MG tablet Take 10 mg by mouth daily as needed for migraine.      No current facility-administered medications for this visit.     Allergies:   Patient has no known allergies.   Social History:  The patient  reports that she has never smoked. She has never used smokeless tobacco. She reports that she does not drink alcohol or use drugs.   Family History:   family history includes Alzheimer's disease in her maternal grandmother; Anemia in her sister; Arrhythmia in her  sister; Arthritis in her father and mother; Bipolar disorder in her sister; Breast cancer in her paternal grandmother; Diabetes in her maternal grandmother; Diverticulitis in her mother; Emphysema in her maternal grandfather; GER disease in her mother; Healthy in her brother; Heart attack in her maternal grandmother and paternal grandfather; Heart disease in her paternal grandfather; Hyperlipidemia in her father; Hypertension in her father; Migraines in her mother; Thyroid disease in her father, mother, sister, and sister.    Review of Systems: Review of Systems  Constitutional: Negative.   Respiratory: Negative.   Cardiovascular: Positive for palpitations.  Gastrointestinal: Negative.   Musculoskeletal: Negative.   Neurological: Negative.   Psychiatric/Behavioral: Negative.   All other systems reviewed and are negative.   PHYSICAL EXAM: VS:  BP 136/82 (BP Location: Left Arm, Patient Position: Sitting, Cuff Size: Normal)   Pulse 95   Ht 5\' 3"  (1.6 m)   Wt 178 lb (80.7 kg)   BMI 31.53 kg/m  , BMI Body mass index is 31.53 kg/m. GEN: Well nourished, well developed, in no acute distress  HEENT: normal  Neck: no JVD, carotid bruits, or masses Cardiac: RRR; no murmurs, rubs, or gallops,no edema  Respiratory:  clear to auscultation bilaterally, normal work of breathing GI: soft, nontender, nondistended, + BS Carrie: no deformity or atrophy  Skin: warm and dry, no rash Neuro:  Strength and sensation are intact Psych: euthymic mood, full affect    Recent Labs: 03/22/2019: ALT 18; BUN 17; Creatinine, Ser 0.71; Hemoglobin 12.7; Platelets 309.0; Potassium 4.3; Sodium 138; TSH 1.80    Lipid Panel Lab Results  Component Value Date   CHOL 229 (H) 03/22/2019   HDL 70.10 03/22/2019   LDLCALC 143 (H) 03/22/2019   TRIG 84.0 03/22/2019      Wt Readings from Last 3 Encounters:  05/19/19 178 lb (80.7 kg)  03/02/19 172 lb (78 kg)  01/20/19 177 lb 8 oz (80.5 kg)       ASSESSMENT AND  PLAN:  Tachycardia - Plan: EKG 12-Lead No significant tachycardia noted on 2 week monitor Having APCs and PVCs No changes to her medications, no further work-up needed  Anxiety Managed by primary care and counselor Recommended regular walking program Did not tolerate SSRI, had a rash.  She stopped the medication  Hypercholesteremia Few other risk factors, non-smoker, no diabetes Recommended lifestyle modification  Chest pain Atypical in nature, positional component, likely musculoskeletal, at times reproducible Normal echocardiogram and stress test Unclear if additional work-up is needed but we have offered CT coronary calcium scoring to rule out structural issues such as hiatal hernia, other pathology  Disposition:   F/U as needed   Total encounter time more than 25 minutes  Greater than 50% was spent in counseling and coordination of care with the patient    Orders Placed This Encounter  Procedures  . CT CARDIAC SCORING     Signed, Esmond Plants, M.D., Ph.D. 05/19/2019  [place], [place]

## 2019-05-19 ENCOUNTER — Other Ambulatory Visit: Payer: Self-pay

## 2019-05-19 ENCOUNTER — Encounter: Payer: Self-pay | Admitting: Cardiovascular Disease

## 2019-05-19 ENCOUNTER — Ambulatory Visit: Payer: BC Managed Care – PPO | Admitting: Cardiovascular Disease

## 2019-05-19 VITALS — BP 136/82 | HR 95 | Ht 63.0 in | Wt 178.0 lb

## 2019-05-19 DIAGNOSIS — I493 Ventricular premature depolarization: Secondary | ICD-10-CM

## 2019-05-19 DIAGNOSIS — R0602 Shortness of breath: Secondary | ICD-10-CM | POA: Diagnosis not present

## 2019-05-19 DIAGNOSIS — R000 Tachycardia, unspecified: Secondary | ICD-10-CM | POA: Diagnosis not present

## 2019-05-19 DIAGNOSIS — F419 Anxiety disorder, unspecified: Secondary | ICD-10-CM

## 2019-05-19 DIAGNOSIS — R079 Chest pain, unspecified: Secondary | ICD-10-CM | POA: Diagnosis not present

## 2019-05-19 DIAGNOSIS — E7800 Pure hypercholesterolemia, unspecified: Secondary | ICD-10-CM

## 2019-05-19 NOTE — Patient Instructions (Addendum)
CT coronary calcium score We will order CT coronary calcium score $150 Family History     Please call [phone] to schedule   [facility] [address] [place], [place] [address]    Medication Instructions:  No changes  If you need a refill on your cardiac medications before your next appointment, please call your pharmacy.    Lab work: No new labs needed   If you have labs (blood work) drawn today and your tests are completely normal, you will receive your results only by: Marland Kitchen MyChart Message (if you have MyChart) OR . A paper copy in the mail If you have any lab test that is abnormal or we need to change your treatment, we will call you to review the results.   Testing/Procedures: CT coronary calcium score We will order CT coronary calcium score $150 Family History     Please call [phone] to schedule   [facility] [address] [place], [place] [address]   Follow-Up: At [facility], you and your health needs are our priority.  As part of our continuing mission to provide you with exceptional heart care, we have created designated Provider Care Teams.  These Care Teams include your primary Cardiologist (physician) and Advanced Practice Providers (APPs -  Physician Assistants and Nurse Practitioners) who all work together to provide you with the care you need, when you need it.  . You will need a follow up appointment as needed   . Providers on your designated Care Team:   . Murray Hodgkins, NP . Christell Faith, PA-C . Marrianne Mood, PA-C  Any Other Special Instructions Will Be Listed Below (If Applicable).  For educational health videos Log in to : [email or web address] Or : [email or web address], password : [email or web address]

## 2019-05-27 ENCOUNTER — Ambulatory Visit
Admission: RE | Admit: 2019-05-27 | Discharge: 2019-05-27 | Disposition: A | Discharge status: Home / Self Care | Payer: BC Managed Care – PPO | Source: Ambulatory Visit | Attending: Internal Medicine | Admitting: Internal Medicine

## 2019-05-27 ENCOUNTER — Other Ambulatory Visit: Payer: Self-pay

## 2019-05-27 DIAGNOSIS — N644 Mastodynia: Secondary | ICD-10-CM

## 2019-07-27 ENCOUNTER — Other Ambulatory Visit: Payer: Self-pay

## 2019-07-27 ENCOUNTER — Ambulatory Visit (INDEPENDENT_AMBULATORY_CARE_PROVIDER_SITE_OTHER): Payer: BC Managed Care – PPO | Admitting: Internal Medicine

## 2019-07-27 DIAGNOSIS — R509 Fever, unspecified: Secondary | ICD-10-CM | POA: Insufficient documentation

## 2019-07-27 DIAGNOSIS — F419 Anxiety disorder, unspecified: Secondary | ICD-10-CM | POA: Diagnosis not present

## 2019-07-27 DIAGNOSIS — G43019 Migraine without aura, intractable, without status migrainosus: Secondary | ICD-10-CM

## 2019-07-27 DIAGNOSIS — R079 Chest pain, unspecified: Secondary | ICD-10-CM | POA: Diagnosis not present

## 2019-07-27 DIAGNOSIS — R42 Dizziness and giddiness: Secondary | ICD-10-CM

## 2019-07-27 NOTE — Progress Notes (Signed)
Patient ID: Carrie [name], female   DOB: 04/06/1976, 43 y.o.   MRN: [id number]   Virtual Visit via video Note  This visit type was conducted due to national recommendations for restrictions regarding the COVID-19 pandemic (e.g. social distancing).  This format is felt to be most appropriate for this patient at this time.  All issues noted in this document were discussed and addressed.  No physical exam was performed (except for noted visual exam findings with Video Visits).   I connected with Carrie KoyanagiLaura Raider by a video enabled telemedicine application and verified that I am speaking with the correct person using two identifiers. Location patient: home Location provider: work  Persons participating in the virtual visit: patient, provider  I discussed the limitations, risks, security and privacy concerns of performing an evaluation and management service by video and the availability of in person appointments.  The patient expressed understanding and agreed to proceed.   Reason for visit: work in appt.    HPI: She has been evaluated recently for chest pain.  Stress test - no ischemia 12/2018.  ECHO - essentially normal 05/11/19.  Event monitor rare PVC.  Per note, felt to be more msk.  They did discuss CT coronary calcium scoring.  She reports the chest symptoms are better.  Has noticed recently some dizziness/light headedness.  Will occasionally feel hot and experience some dizziness.  Some blurring of vision at times.  No loss of vision.  Has noticed a black spot - right eye.  Some floaters occasionally.  States her head feels funny. Notices more symptoms when looks down and to the left.  States feels like her eyes "crazy".  Had had two episodes of nausea.  No vomiting.  Describes a constant headache.  Did notice this week low grade fever.  tmax 100.5.  No known covid exposures.  Discussed testing today.  No chest pain.  No sob.  No chest congestion or cough.  Takes claritin.  No general body aches.  No  rash.  Hands will tingle at times.  She is trying to teach through virtual learning.  On her computer a lot.  Has appt with eye MD next week.     ROS: See pertinent positives and negatives per HPI.  Past Medical History:  Diagnosis Date   Abnormal Pap smear    Allergy    Arthritis    t-12   Asthma    exercised induced   Depression    GERD (gastroesophageal reflux disease)    Headache(784.0)    Mononucleosis 1998   PONV (postoperative nausea and vomiting)    takes awhile to wake up    Past Surgical History:  Procedure Laterality Date   CARPAL TUNNEL RELEASE Bilateral 2016   CESAREAN SECTION N/A 10/30/2013   Procedure: CESAREAN SECTION;  Surgeon: Meriel Picaichard M Holland, MD;  Location: [facility];  Service: Obstetrics;  Laterality: N/A;  PRIMARY   CHOLECYSTECTOMY N/A 10/24/2017   Procedure: LAPAROSCOPIC CHOLECYSTECTOMY;  Surgeon: Lattie Hawooper, Richard E, MD;  Location: [facility];  Service: General;  Laterality: N/A;   COLPOSCOPY W/ BIOPSY / CURETTAGE     DE QUERVAIN'S RELEASE Left 2016   DILATION AND CURETTAGE OF UTERUS     FOOT FRACTURE SURGERY Left    HAND SURGERY Left    Fracture repair   IVF      Family History  Problem Relation Age of Onset   Arthritis Mother    Diverticulitis Mother    Thyroid disease Mother    GER  disease Mother    Migraines Mother    Arthritis Father    Hypertension Father    Hyperlipidemia Father    Thyroid disease Father    Diabetes Maternal Grandmother    Heart attack Maternal Grandmother    Alzheimer's disease Maternal Grandmother    Heart disease Paternal Grandfather    Heart attack Paternal Grandfather    Anemia Sister    Thyroid disease Sister    Healthy Brother    Emphysema Maternal Grandfather    Breast cancer Paternal Grandmother    Thyroid disease Sister    Bipolar disorder Sister    Arrhythmia Sister        Ventricular Tachycardia    SOCIAL HX: reviewed.    Current Outpatient Medications:     acetaminophen (TYLENOL) 500 MG tablet, Take 1,000-1,500 mg by mouth every 6 (six) hours as needed for moderate pain or headache. , Disp: , Rfl:    albuterol (PROAIR HFA) 108 (90 Base) MCG/ACT inhaler, Inhale 2 puffs into the lungs every 6 (six) hours as needed for wheezing or shortness of breath., Disp: 1 Inhaler, Rfl: 2   CALCIUM PO, Take 1 tablet by mouth daily., Disp: , Rfl:    Cholecalciferol (VITAMIN D PO), Take 2 capsules by mouth daily. , Disp: , Rfl:    fluticasone (FLONASE) 50 MCG/ACT nasal spray, PLACE 1 SPRAY INTO BOTH NOSTRILS AT BEDTIME., Disp: 48 g, Rfl: 2   ibuprofen (ADVIL,MOTRIN) 200 MG tablet, Take 600 mg by mouth every 6 (six) hours as needed for headache or moderate pain., Disp: , Rfl:    lansoprazole (PREVACID) 15 MG capsule, Take 15 mg by mouth daily. , Disp: , Rfl:    loratadine (CLARITIN) 10 MG tablet, Take 10 mg by mouth daily as needed. , Disp: , Rfl:    magnesium oxide (MAG-OX) 400 (241.3 Mg) MG tablet, TAKE 1 TABLET EVERY DAY (Patient taking differently: Take 400 mg by mouth once daily), Disp: 30 tablet, Rfl: 1   methylphenidate 54 MG PO CR tablet, Take 54 mg by mouth daily., Disp: , Rfl:    Omega-3 Fatty Acids (FISH OIL BURP-LESS PO), Take 1 capsule by mouth daily. , Disp: , Rfl:    propranolol (INDERAL) 20 MG tablet, Take 1 tablet (20 mg total) by mouth 3 (three) times daily as needed., Disp: 90 tablet, Rfl: 3   rizatriptan (MAXALT) 10 MG tablet, Take 10 mg by mouth daily as needed for migraine. , Disp: , Rfl:   EXAM:  GENERAL: alert, oriented, appears well and in no acute distress  HEENT: atraumatic, conjunttiva clear, no obvious abnormalities on inspection of external nose and ears  NECK: normal movements of the head and neck  LUNGS: on inspection no signs of respiratory distress, breathing rate appears normal, no obvious gross SOB, gasping or wheezing  CV: no obvious cyanosis  PSYCH/NEURO: pleasant and cooperative, no obvious depression or  anxiety, speech and thought processing grossly intact  ASSESSMENT AND PLAN:  Discussed the following assessment and plan:  Anxiety Has been followed by Dr Evelene Croon.  Discussed with her today.  Overall she feels she is handling things relatively well.  Follow.    Chest pain Has been worked up by cardiology as outlined.  Overall stable.  Hold on any further cardiac testing at this time.  She is aware of CT calcium scoring.    Common migraine with intractable migraine S/p SPG block.  Did well with this.  Sees Dr Sherryll Burger.  Has had the constant headache  as outlined.  Discussed scanning.  She is on the computer more, etc.  Has eye appt next week. Await results.  Further w/up pending their assessment.    Fever Low grade.  Given recent fever in the setting of recent change in symptoms as outlined, including dizziness, headache, etc, will check for covid to confirm no infection.  Discussed self quarantine and family as much as possible.     Dizziness Unclear etiology.  Reproducible symptoms with looking down and to the left.  Makes her head feel different. Some position changes.  Discussed possible vertigo.  Discussed scanning.  Has seen neurology.  Await eye exam.  Treat symptoms.  Stay hydrated.  Call with update over the next week.      I discussed the assessment and treatment plan with the patient. The patient was provided an opportunity to ask questions and all were answered. The patient agreed with the plan and demonstrated an understanding of the instructions.   The patient was advised to call back or seek an in-person evaluation if the symptoms worsen or if the condition fails to improve as anticipated.   Einar Pheasant, MD

## 2019-07-28 ENCOUNTER — Telehealth: Payer: Self-pay

## 2019-07-28 NOTE — Telephone Encounter (Signed)
Copied from [place] [id number]. Topic: General - Other >> Jul 28, 2019  1:19 PM Alanda Slim E wrote: Reason for CRM: Pts husband called to speak with Dr. Nicki Reaper about the Pts plan of care and most recent office visit/ please advise

## 2019-07-28 NOTE — Telephone Encounter (Signed)
Please make sure pt ok with speaking with husband.

## 2019-07-28 NOTE — Telephone Encounter (Signed)
LMTCB

## 2019-07-28 NOTE — Telephone Encounter (Signed)
Patients husband stated that he would like a call back from you if possible to briefly discuss possibility of COVID and overall plan of care. Advised that you are not in the office this afternoon but would send over message.

## 2019-07-29 ENCOUNTER — Telehealth: Payer: Self-pay | Admitting: Internal Medicine

## 2019-07-29 NOTE — Telephone Encounter (Signed)
Called and spoke to husband.  Questions answered.

## 2019-07-29 NOTE — Telephone Encounter (Signed)
LMTCB

## 2019-07-29 NOTE — Telephone Encounter (Signed)
I sent her a my chart message but there is a DPR scanned in the chart with his name on it.

## 2019-07-29 NOTE — Telephone Encounter (Signed)
Patient called back returning Trisha's message, stating it is ok to speak to patient's husband. She thought she signed and paced him on release of information.

## 2019-07-30 NOTE — Telephone Encounter (Signed)
Called and spoke to husband.  See other phone message.

## 2019-07-30 NOTE — Telephone Encounter (Signed)
Patient is ok with you talking to her husband

## 2019-08-01 ENCOUNTER — Encounter: Payer: Self-pay | Admitting: Internal Medicine

## 2019-08-01 DIAGNOSIS — R42 Dizziness and giddiness: Secondary | ICD-10-CM | POA: Insufficient documentation

## 2019-08-01 NOTE — Assessment & Plan Note (Signed)
Unclear etiology.  Reproducible symptoms with looking down and to the left.  Makes her head feel different. Some position changes.  Discussed possible vertigo.  Discussed scanning.  Has seen neurology.  Await eye exam.  Treat symptoms.  Stay hydrated.  Call with update over the next week.

## 2019-08-01 NOTE — Assessment & Plan Note (Signed)
Has been followed by Dr Toy Care.  Discussed with her today.  Overall she feels she is handling things relatively well.  Follow.

## 2019-08-01 NOTE — Assessment & Plan Note (Signed)
Has been worked up by cardiology as outlined.  Overall stable.  Hold on any further cardiac testing at this time.  She is aware of CT calcium scoring.

## 2019-08-01 NOTE — Assessment & Plan Note (Signed)
S/p SPG block.  Did well with this.  Sees Dr Manuella Ghazi.  Has had the constant headache as outlined.  Discussed scanning.  She is on the computer more, etc.  Has eye appt next week. Await results.  Further w/up pending their assessment.

## 2019-08-01 NOTE — Assessment & Plan Note (Addendum)
Low grade.  Given recent fever in the setting of recent change in symptoms as outlined, including dizziness, headache, etc, will check for covid to confirm no infection.  Discussed self quarantine and family as much as possible.

## 2019-08-05 ENCOUNTER — Encounter: Payer: Self-pay | Admitting: Internal Medicine

## 2019-08-05 DIAGNOSIS — R519 Headache, unspecified: Secondary | ICD-10-CM

## 2019-08-10 NOTE — Telephone Encounter (Signed)
Called and spoke with pt and neurology.  Will schedule MRI.  Plan f/u with neurology.

## 2019-08-13 ENCOUNTER — Telehealth: Payer: Self-pay

## 2019-08-13 NOTE — Telephone Encounter (Signed)
Copied from [place] [id number]. Topic: General - Other >> Aug 13, 2019  1:48 PM Sheran Luz wrote: Patient would like to know who to contact about scheduling MRI, as advised. She states she received call from Neurology to schedule follow up but cannot do that until she has MRI.

## 2019-08-25 ENCOUNTER — Encounter: Payer: Self-pay | Admitting: Internal Medicine

## 2019-08-25 ENCOUNTER — Other Ambulatory Visit: Payer: Self-pay

## 2019-08-25 ENCOUNTER — Ambulatory Visit
Admission: RE | Admit: 2019-08-25 | Discharge: 2019-08-25 | Disposition: A | Discharge status: Home / Self Care | Payer: BC Managed Care – PPO | Source: Ambulatory Visit | Attending: Internal Medicine | Admitting: Internal Medicine

## 2019-08-25 DIAGNOSIS — R519 Headache, unspecified: Secondary | ICD-10-CM | POA: Insufficient documentation

## 2019-08-25 MED ORDER — GADOBUTROL 1 MMOL/ML IV SOLN
7.5000 mL | Freq: Once | INTRAVENOUS | Status: AC | PRN
  Administered 2019-08-25: 7.5 mL via INTRAVENOUS

## 2019-11-30 ENCOUNTER — Ambulatory Visit (INDEPENDENT_AMBULATORY_CARE_PROVIDER_SITE_OTHER): Payer: BC Managed Care – PPO | Admitting: Internal Medicine

## 2019-11-30 ENCOUNTER — Other Ambulatory Visit: Payer: Self-pay

## 2019-11-30 DIAGNOSIS — E7800 Pure hypercholesterolemia, unspecified: Secondary | ICD-10-CM | POA: Diagnosis not present

## 2019-11-30 DIAGNOSIS — G43019 Migraine without aura, intractable, without status migrainosus: Secondary | ICD-10-CM | POA: Diagnosis not present

## 2019-11-30 DIAGNOSIS — K219 Gastro-esophageal reflux disease without esophagitis: Secondary | ICD-10-CM

## 2019-11-30 DIAGNOSIS — W19XXXA Unspecified fall, initial encounter: Secondary | ICD-10-CM

## 2019-11-30 DIAGNOSIS — R42 Dizziness and giddiness: Secondary | ICD-10-CM

## 2019-11-30 DIAGNOSIS — R079 Chest pain, unspecified: Secondary | ICD-10-CM

## 2019-11-30 DIAGNOSIS — F419 Anxiety disorder, unspecified: Secondary | ICD-10-CM

## 2019-11-30 NOTE — Progress Notes (Signed)
Patient ID: Carrie [name], female   DOB: [birth year], 44 y.o.   MRN: [id number]   Virtual Visit via video Note  This visit type was conducted due to national recommendations for restrictions regarding the COVID-19 pandemic (e.g. social distancing).  This format is felt to be most appropriate for this patient at this time.  All issues noted in this document were discussed and addressed.  No physical exam was performed (except for noted visual exam findings with Video Visits).   I connected with Trinidi Toppins by a video enabled telemedicine application and verified that I am speaking with the correct person using two identifiers. Location patient: home Location provider: work  Persons participating in the virtual visit: patient, provider  The limitations, risks, security and privacy concerns of performing an evaluation and management service by video and the availability of in person appointments have been discussed.  The patient expressed understanding and agreed to proceed.   Reason for visit: scheduled follow up.   HPI: Being followed by neurology.  Diagnosed with migraine with aura and chronic daily headaches.  Recommended mag oxide.  Receiving ajovy monthly injections.  Per last note, sumatriptan prescribed to take as needed. Also saw ENT - vertigo test ok.  Headache better.  No significant dizziness.  Eating.  No nausea or vomiting.  No abdominal pain reported.  [place] recently.  Right foot slipped on stairs.  Was carrying vacuum.  Did not hit her head.  Right anterior chest - bruise.  Some pain - left side under rib cage - left side back.  Sitting/standing for extended period of time - hurts.  Taking advil/tylenol.  Discussed changing to protonix generic - to help control acid reflux.  Increased stress. Discussed.  Overall handling things relatively well.  Notify me if feels needs anything more.    ROS: See pertinent positives and negatives per HPI.  Past Medical History:  Diagnosis Date  .  Abnormal Pap smear   . Allergy   . 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 Asthma    exercised induced  . Depression   . GERD (gastroesophageal reflux disease)   . Headache(784.0)   . Mononucleosis 1998  . PONV (postoperative nausea and vomiting)    takes awhile to wake up    Past Surgical History:  Procedure Laterality Date  . CARPAL TUNNEL RELEASE Bilateral 2016  . CESAREAN SECTION N/A 10/30/2013   Procedure: CESAREAN SECTION;  Surgeon: Margarette Asal, MD;  Location: [facility];  Service: Obstetrics;  Laterality: N/A;  PRIMARY  . CHOLECYSTECTOMY N/A 10/24/2017   Procedure: LAPAROSCOPIC CHOLECYSTECTOMY;  Surgeon: Florene Glen, MD;  Location: [facility];  Service: General;  Laterality: N/A;  . COLPOSCOPY W/ BIOPSY / CURETTAGE    . DE QUERVAIN'S RELEASE Left 2016  . DILATION AND CURETTAGE OF UTERUS    . FOOT FRACTURE SURGERY Left   . HAND SURGERY Left    Fracture repair  . IVF      Family History  Problem Relation Age of Onset  . Arthritis Mother   . Diverticulitis Mother   . Thyroid disease Mother   . GER disease Mother   . Migraines Mother   . Arthritis Father   . Hypertension Father   . Hyperlipidemia Father   . Thyroid disease Father   . Diabetes Maternal Grandmother   . Heart attack Maternal Grandmother   . Alzheimer's disease Maternal Grandmother   . Heart disease Paternal Grandfather   . Heart attack Paternal Grandfather   .  Anemia Sister   . Thyroid disease Sister   . Healthy Brother   . Emphysema Maternal Grandfather   . Breast cancer Paternal Grandmother   . Thyroid disease Sister   . Bipolar disorder Sister   . Arrhythmia Sister        Ventricular Tachycardia    SOCIAL HX: reviewed.    Current Outpatient Medications:  .  acetaminophen (TYLENOL) 500 MG tablet, Take 1,000-1,500 mg by mouth every 6 (six) hours as needed for moderate pain or headache. , Disp: , Rfl:  .  albuterol (PROAIR HFA) 108 (90 Base) MCG/ACT inhaler, Inhale 2 puffs into the lungs every 6 (six)  hours as needed for wheezing or shortness of breath., Disp: 1 Inhaler, Rfl: 2 .  CALCIUM PO, Take 1 tablet by mouth daily., Disp: , Rfl:  .  Cholecalciferol (VITAMIN D PO), Take 2 capsules by mouth daily. , Disp: , Rfl:  .  fluticasone (FLONASE) 50 MCG/ACT nasal spray, PLACE 1 SPRAY INTO BOTH NOSTRILS AT BEDTIME., Disp: 48 g, Rfl: 2 .  ibuprofen (ADVIL,MOTRIN) 200 MG tablet, Take 600 mg by mouth every 6 (six) hours as needed for headache or moderate pain., Disp: , Rfl:  .  loratadine (CLARITIN) 10 MG tablet, Take 10 mg by mouth daily as needed. , Disp: , Rfl:  .  magnesium oxide (MAG-OX) 400 (241.3 Mg) MG tablet, TAKE 1 TABLET EVERY DAY (Patient taking differently: Take 400 mg by mouth once daily), Disp: 30 tablet, Rfl: 1 .  methylphenidate 54 MG PO CR tablet, Take 54 mg by mouth daily., Disp: , Rfl:  .  Omega-3 Fatty Acids (FISH OIL BURP-LESS PO), Take 1 capsule by mouth daily. , Disp: , Rfl:  .  pantoprazole (PROTONIX) 40 MG tablet, Take 1 tablet (40 mg total) by mouth daily., Disp: 30 tablet, Rfl: 2 .  propranolol (INDERAL) 20 MG tablet, Take 1 tablet (20 mg total) by mouth 3 (three) times daily as needed., Disp: 90 tablet, Rfl: 3 .  rizatriptan (MAXALT) 10 MG tablet, Take 10 mg by mouth daily as needed for migraine. , Disp: , Rfl:   EXAM:  GENERAL: alert, oriented, appears well and in no acute distress  HEENT: atraumatic, conjunttiva clear, no obvious abnormalities on inspection of external nose and ears  NECK: normal movements of the head and neck  LUNGS: on inspection no signs of respiratory distress, breathing rate appears normal, no obvious gross SOB, gasping or wheezing  CV: no obvious cyanosis  PSYCH/NEURO: pleasant and cooperative, no obvious depression or anxiety, speech and thought processing grossly intact  ASSESSMENT AND PLAN:  Discussed the following assessment and plan:  Acid reflux Discussed changing to protonix (generic).  Follow.   Chest pain Has been worked up  by cardiology.  Improved.  Follow.    Common migraine with intractable migraine S/p SPG block.  Followed by neurology.  Mag oxide.  Continue ajovy.  Doing better.  Follow.   Hypercholesteremia Low cholesterol diet and exercise.  Follow lipid panel.   Anxiety Discussed today - increased stress.  Overall handling things relatively well.  Follow.    Dizziness Has seen ENT. Seeing neurology.  Stable.    Fall Recent fall as outlined.  Pian left side (under rib cage - left side back). Taking tylenol/advil.  Continue conservative measures.  Follow.     No orders of the defined types were placed in this encounter.   Meds ordered this encounter  Medications  . pantoprazole (PROTONIX) 40 MG tablet  Sig: Take 1 tablet (40 mg total) by mouth daily.    Dispense:  30 tablet    Refill:  2     I discussed the assessment and treatment plan with the patient. The patient was provided an opportunity to ask questions and all were answered. The patient agreed with the plan and demonstrated an understanding of the instructions.   The patient was advised to call back or seek an in-person evaluation if the symptoms worsen or if the condition fails to improve as anticipated.    Dale Patagonia, MD

## 2019-12-05 ENCOUNTER — Encounter: Payer: Self-pay | Admitting: Internal Medicine

## 2019-12-05 DIAGNOSIS — W19XXXA Unspecified fall, initial encounter: Secondary | ICD-10-CM | POA: Insufficient documentation

## 2019-12-05 MED ORDER — PANTOPRAZOLE SODIUM 40 MG PO TBEC: 40.0000 mg | DELAYED_RELEASE_TABLET | Freq: Every day | ORAL | 2 refills | Status: DC

## 2019-12-05 NOTE — Assessment & Plan Note (Signed)
Low cholesterol diet and exercise.  Follow lipid panel.   

## 2019-12-05 NOTE — Assessment & Plan Note (Signed)
Discussed today - increased stress.  Overall handling things relatively well.  Follow.

## 2019-12-05 NOTE — Assessment & Plan Note (Signed)
Discussed changing to protonix (generic).  Follow.

## 2019-12-05 NOTE — Assessment & Plan Note (Signed)
S/p SPG block.  Followed by neurology.  Mag oxide.  Continue ajovy.  Doing better.  Follow.

## 2019-12-05 NOTE — Assessment & Plan Note (Signed)
Recent fall as outlined.  Pian left side (under rib cage - left side back). Taking tylenol/advil.  Continue conservative measures.  Follow.

## 2019-12-05 NOTE — Assessment & Plan Note (Signed)
Has seen ENT. Seeing neurology.  Stable.

## 2019-12-05 NOTE — Assessment & Plan Note (Signed)
Has been worked up by cardiology.  Improved.  Follow.

## 2019-12-10 ENCOUNTER — Encounter: Payer: Self-pay | Admitting: Internal Medicine

## 2019-12-13 ENCOUNTER — Other Ambulatory Visit: Payer: Self-pay | Admitting: Internal Medicine

## 2019-12-13 DIAGNOSIS — J0140 Acute pansinusitis, unspecified: Secondary | ICD-10-CM

## 2019-12-18 ENCOUNTER — Ambulatory Visit: Payer: BC Managed Care – PPO | Attending: Internal Medicine

## 2019-12-18 DIAGNOSIS — Z23 Encounter for immunization: Secondary | ICD-10-CM | POA: Insufficient documentation

## 2019-12-18 NOTE — Progress Notes (Signed)
   Covid-19 Vaccination Clinic  Name:  Carrie [name]    MRN: [id number] DOB: [birth year]  12/18/2019  Carrie [name] was observed post Covid-19 immunization for 15 minutes without incidence. She was provided with Vaccine Information Sheet and instruction to access the V-Safe system.   Carrie [name] was instructed to call 911 with any severe reactions post vaccine: Marland Kitchen Difficulty breathing  . Swelling of your face and throat  . A fast heartbeat  . A bad rash all over your body  . Dizziness and weakness    Immunizations Administered    Name Date Dose VIS Date Route   Pfizer COVID-19 Vaccine 12/18/2019  9:08 AM 0.3 mL 10/22/2019 Intramuscular   Manufacturer: [employer], [employer]   Lot: [id number]   NDC: [address]-5

## 2020-01-12 ENCOUNTER — Ambulatory Visit: Payer: BC Managed Care – PPO

## 2020-01-14 ENCOUNTER — Ambulatory Visit: Payer: BC Managed Care – PPO | Attending: Internal Medicine

## 2020-01-14 DIAGNOSIS — Z23 Encounter for immunization: Secondary | ICD-10-CM

## 2020-01-14 NOTE — Progress Notes (Signed)
   Covid-19 Vaccination Clinic  Name:  Meriah Shands    MRN: [id number] DOB: 1976/10/21  01/14/2020  Ms. Parran was observed post Covid-19 immunization for 15 minutes without incident. She was provided with Vaccine Information Sheet and instruction to access the V-Safe system.   Ms. Rebman was instructed to call 911 with any severe reactions post vaccine: Marland Kitchen Difficulty breathing  . Swelling of face and throat  . A fast heartbeat  . A bad rash all over body  . Dizziness and weakness   Immunizations Administered    Name Date Dose VIS Date Route   Pfizer COVID-19 Vaccine 01/14/2020 12:06 PM 0.3 mL 10/22/2019 Intramuscular   Manufacturer: [employer], [employer]   Lot: [id number]   NDC: [address]-8

## 2020-02-27 ENCOUNTER — Other Ambulatory Visit: Payer: Self-pay | Admitting: Internal Medicine

## 2020-07-30 ENCOUNTER — Encounter: Payer: Self-pay | Admitting: Internal Medicine

## 2020-08-02 NOTE — Telephone Encounter (Signed)
I do not mind ordering labs prior to appt if she desires.  Just let her know that after we talk and I examine her - there may (or may not) be other labs needed.  If she wants to go ahead and do some labs prior - ok to schedule fasting lab appt and let me know - I will need to order the labs.

## 2020-08-02 NOTE — Telephone Encounter (Signed)
Pt is going to wait until appt for labs

## 2020-08-02 NOTE — Telephone Encounter (Signed)
Left message for patient to return my call.

## 2020-08-09 ENCOUNTER — Encounter: Payer: Self-pay | Admitting: Internal Medicine

## 2020-08-09 ENCOUNTER — Other Ambulatory Visit: Payer: Self-pay

## 2020-08-09 ENCOUNTER — Ambulatory Visit (INDEPENDENT_AMBULATORY_CARE_PROVIDER_SITE_OTHER): Payer: BC Managed Care – PPO | Admitting: Internal Medicine

## 2020-08-09 VITALS — BP 126/82 | HR 90 | Temp 98.2°F | Resp 16 | Wt 200.0 lb

## 2020-08-09 DIAGNOSIS — K219 Gastro-esophageal reflux disease without esophagitis: Secondary | ICD-10-CM

## 2020-08-09 DIAGNOSIS — R41840 Attention and concentration deficit: Secondary | ICD-10-CM

## 2020-08-09 DIAGNOSIS — Z9289 Personal history of other medical treatment: Secondary | ICD-10-CM

## 2020-08-09 DIAGNOSIS — R5383 Other fatigue: Secondary | ICD-10-CM | POA: Diagnosis not present

## 2020-08-09 DIAGNOSIS — G43829 Menstrual migraine, not intractable, without status migrainosus: Secondary | ICD-10-CM | POA: Diagnosis not present

## 2020-08-09 DIAGNOSIS — Z23 Encounter for immunization: Secondary | ICD-10-CM

## 2020-08-09 DIAGNOSIS — F419 Anxiety disorder, unspecified: Secondary | ICD-10-CM

## 2020-08-09 DIAGNOSIS — E7800 Pure hypercholesterolemia, unspecified: Secondary | ICD-10-CM

## 2020-08-09 HISTORY — DX: Personal history of other medical treatment: Z92.89

## 2020-08-09 LAB — BASIC METABOLIC PANEL
BUN: 18 mg/dL (ref 6–23)
CO2: 28 mEq/L (ref 19–32)
Calcium: 9.3 mg/dL (ref 8.4–10.5)
Chloride: 104 mEq/L (ref 96–112)
Creatinine, Ser: 0.67 mg/dL (ref 0.40–1.20)
GFR: 95.8 mL/min (ref >60.00)
Glucose, Bld: 91 mg/dL (ref 70–99)
Potassium: 4.9 mEq/L (ref 3.5–5.1)
Sodium: 138 mEq/L (ref 135–145)

## 2020-08-09 LAB — CBC WITH DIFFERENTIAL/PLATELET
Basophils Absolute: 0 10*3/uL (ref 0.0–0.1)
Basophils Relative: 0.4 % (ref 0.0–3.0)
Eosinophils Absolute: 0.1 10*3/uL (ref 0.0–0.7)
Eosinophils Relative: 2 % (ref 0.0–5.0)
HCT: 36.9 % (ref 36.0–46.0)
Hemoglobin: 12.2 g/dL (ref 12.0–15.0)
Lymphocytes Relative: 37.7 % (ref 12.0–46.0)
Lymphs Abs: 2.1 10*3/uL (ref 0.7–4.0)
MCHC: 33.1 g/dL (ref 30.0–36.0)
MCV: 88.8 fl (ref 78.0–100.0)
Monocytes Absolute: 0.4 10*3/uL (ref 0.1–1.0)
Monocytes Relative: 7.2 % (ref 3.0–12.0)
Neutro Abs: 2.9 10*3/uL (ref 1.4–7.7)
Neutrophils Relative %: 52.7 % (ref 43.0–77.0)
Platelets: 313 10*3/uL (ref 150.0–400.0)
RBC: 4.16 Mil/uL (ref 3.87–5.11)
RDW: 13.9 % (ref 11.5–15.5)
WBC: 5.6 10*3/uL (ref 4.0–10.5)

## 2020-08-09 LAB — LIPID PANEL
Cholesterol: 232 mg/dL — ABNORMAL HIGH (ref 0–200)
HDL: 61.7 mg/dL (ref >39.00)
LDL Cholesterol: 153 mg/dL — ABNORMAL HIGH (ref 0–99)
NonHDL: 169.98
Total CHOL/HDL Ratio: 4
Triglycerides: 83 mg/dL (ref 0.0–149.0)
VLDL: 16.6 mg/dL (ref 0.0–40.0)

## 2020-08-09 LAB — TSH: TSH: 1.27 u[IU]/mL (ref 0.35–4.50)

## 2020-08-09 LAB — HEPATIC FUNCTION PANEL
ALT: 14 U/L (ref 0–35)
AST: 15 U/L (ref 0–37)
Albumin: 4.3 g/dL (ref 3.5–5.2)
Alkaline Phosphatase: 52 U/L (ref 39–117)
Bilirubin, Direct: 0.1 mg/dL (ref 0.0–0.3)
Total Bilirubin: 0.4 mg/dL (ref 0.2–1.2)
Total Protein: 6.7 g/dL (ref 6.0–8.3)

## 2020-08-09 LAB — SEDIMENTATION RATE: Sed Rate: 24 mm/hr — ABNORMAL HIGH (ref 0–20)

## 2020-08-09 NOTE — Progress Notes (Signed)
Patient ID: Shelle Galdamez, female   DOB: 1976-10-08, 44 y.o.   MRN: [id number]   Subjective:    Patient ID: Sherri Rad, female    DOB: [birth year], 44 y.o.   MRN: [id number]  HPI This visit occurred during the SARS-CoV-2 public health emergency.  Safety protocols were in place, including screening questions prior to the visit, additional usage of staff PPE, and extensive cleaning of exam room while observing appropriate contact time as indicated for disinfecting solutions.  Patient here for a scheduled follow up. Here to follow up regarding her headaches, hypercholesterolemia and reflux.  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  Is now s/p right thumb UCL repair and MP capsulorrhaphy.  In a splint.  Per pt, this occurred at work.  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  Increased stress related to above.  Discussed.  Seeing Dr Nicolasa Ducking.  On cymbalta.  Also takes adderall. Feels helps with focus.  She has noticed pin prick sensations and itching.  Noticed various places on her body - thumb, neck, under breast, left arm, lower legs, etc.  Not as severe now, but is still itching.  Did realize she was off antihistamine for a brief time.  Feels is better since starting back.  Question if some allergy trigger.  She was concerned regarding a possible autoimmune disorder.  Headaches are better.  Has not had SPG block in one year.  On ubrelvy.  Has maxalt if needed.  Also on inderal.  No chest pain.  Occasional palpitations, but overall cardiac symptoms improved.  Has gained weight.  Discussed diet and exercise.     Past Medical History:  Diagnosis Date  . Abnormal Pap smear   . Allergy   . 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 Asthma    exercised induced  . Depression   . GERD (gastroesophageal reflux disease)   . Headache(784.0)   . Mononucleosis 1998  . PONV (postoperative nausea and vomiting)    takes awhile to wake up    Past Surgical History:  Procedure Laterality Date  . CARPAL TUNNEL RELEASE Bilateral 2016  . CESAREAN SECTION N/A 10/30/2013   Procedure: CESAREAN SECTION;  Surgeon: Margarette Asal, MD;  Location: [facility];  Service: Obstetrics;  Laterality: N/A;  PRIMARY  . CHOLECYSTECTOMY N/A 10/24/2017   Procedure: LAPAROSCOPIC CHOLECYSTECTOMY;  Surgeon: Florene Glen, MD;  Location: [facility];  Service: General;  Laterality: N/A;  . COLPOSCOPY W/ BIOPSY / CURETTAGE    . DE QUERVAIN'S RELEASE Left 2016  . DILATION AND CURETTAGE OF UTERUS    . FOOT FRACTURE SURGERY Left   . HAND SURGERY Left    Fracture repair  . IVF     Family History  Problem Relation Age of Onset  . Arthritis Mother   . Diverticulitis Mother   . Thyroid disease Mother   . GER disease Mother   . Migraines Mother   . Arthritis Father   . Hypertension Father   . Hyperlipidemia Father   . Thyroid disease Father   . Diabetes Maternal Grandmother   . Heart attack Maternal Grandmother   . Alzheimer's disease Maternal Grandmother   . Heart disease Paternal Grandfather   . Heart attack Paternal Grandfather   . Anemia Sister   . Thyroid disease Sister   . Healthy Brother   . Emphysema Maternal Grandfather   . Breast cancer Paternal Grandmother   .  Thyroid disease Sister   . Bipolar disorder Sister   . Arrhythmia Sister        Ventricular Tachycardia   Social History   Socioeconomic History  . Marital status: Married    Spouse name: Not on file  . Number of children: 1  . Years of education: Master's  . Highest education level: Not on file  Occupational History  . Occupation: Passenger transport manager    Comment: Transport planner  Tobacco Use  . Smoking status: Never Smoker  . Smokeless tobacco: Never Used  Vaping Use  . Vaping Use: Never used  Substance and Sexual Activity  . Alcohol use: No  . Drug use: No  . Sexual activity: Not Currently    Comment: couple is afraid  to mess something up  Other Topics  Concern  . Not on file  Social History Narrative  . Not on file   Social Determinants of Health   Financial Resource Strain:   . Difficulty of Paying Living Expenses: Not on file  Food Insecurity:   . Worried About Charity fundraiser in the Last Year: Not on file  . Ran Out of Food in the Last Year: Not on file  Transportation Needs:   . Lack of Transportation (Medical): Not on file  . Lack of Transportation (Non-Medical): Not on file  Physical Activity:   . Days of Exercise per Week: Not on file  . Minutes of Exercise per Session: Not on file  Stress:   . Feeling of Stress : Not on file  Social Connections:   . Frequency of Communication with Friends and Family: Not on file  . Frequency of Social Gatherings with Friends and Family: Not on file  . Attends Religious Services: Not on file  . Active Member of Clubs or Organizations: Not on file  . Attends Archivist Meetings: Not on file  . Marital Status: Not on file    Outpatient Encounter Medications as of 08/09/2020  Medication Sig  . Ubrogepant (UBRELVY) 100 MG TABS Take by mouth.  Marland Kitchen acetaminophen (TYLENOL) 500 MG tablet Take 1,000-1,500 mg by mouth every 6 (six) hours as needed for moderate pain or headache.   . albuterol (PROAIR HFA) 108 (90 Base) MCG/ACT inhaler Inhale 2 puffs into the lungs every 6 (six) hours as needed for wheezing or shortness of breath.  Marland Kitchen CALCIUM PO Take 1 tablet by mouth daily.  . Cholecalciferol (VITAMIN D PO) Take 2 capsules by mouth daily.   . fluticasone (FLONASE) 50 MCG/ACT nasal spray PLACE 1 SPRAY INTO BOTH NOSTRILS AT BEDTIME.  Marland Kitchen ibuprofen (ADVIL,MOTRIN) 200 MG tablet Take 600 mg by mouth every 6 (six) hours as needed for headache or moderate pain.  Marland Kitchen loratadine (CLARITIN) 10 MG tablet Take 10 mg by mouth daily as needed.   . magnesium oxide (MAG-OX) 400 (241.3 Mg) MG tablet TAKE 1 TABLET EVERY DAY (Patient taking differently: Take 400 mg by mouth once daily)  . methylphenidate 54  MG PO CR tablet Take 54 mg by mouth daily.  . Omega-3 Fatty Acids (FISH OIL BURP-LESS PO) Take 1 capsule by mouth daily.   . pantoprazole (PROTONIX) 40 MG tablet TAKE 1 TABLET BY MOUTH EVERY DAY  . propranolol (INDERAL) 20 MG tablet Take 1 tablet (20 mg total) by mouth 3 (three) times daily as needed.  . rizatriptan (MAXALT) 10 MG tablet Take 10 mg by mouth daily as needed for migraine.    No facility-administered encounter medications on file  as of 08/09/2020.    Review of Systems  Constitutional: Negative for appetite change and unexpected weight change.  HENT: Negative for congestion and sinus pressure.   Respiratory: Negative for cough, chest tightness and shortness of breath.   Cardiovascular: Negative for chest pain, palpitations and leg swelling.  Gastrointestinal: Negative for abdominal pain, diarrhea, nausea and vomiting.  Genitourinary: Negative for difficulty urinating and dysuria.  Musculoskeletal: Negative for joint swelling and myalgias.  Skin: Negative for color change and rash.  Neurological: Negative for dizziness, light-headedness and headaches.  Psychiatric/Behavioral: Negative for agitation and dysphoric mood.       Increased stress as outlined.         Objective:    Physical Exam Vitals reviewed.  Constitutional:      General: She is not in acute distress.    Appearance: Normal appearance.  HENT:     Head: Normocephalic and atraumatic.     Right Ear: External ear normal.     Left Ear: External ear normal.  Eyes:     General: No scleral icterus.       Right eye: No discharge.        Left eye: No discharge.     Conjunctiva/sclera: Conjunctivae normal.  Neck:     Thyroid: No thyromegaly.  Cardiovascular:     Rate and Rhythm: Normal rate and regular rhythm.  Pulmonary:     Effort: No respiratory distress.     Breath sounds: Normal breath sounds. No wheezing.  Abdominal:     General: Bowel sounds are normal.     Palpations: Abdomen is soft.      Tenderness: There is no abdominal tenderness.  Musculoskeletal:        General: No swelling or tenderness.     Cervical back: Neck supple. No tenderness.  Lymphadenopathy:     Cervical: No cervical adenopathy.  Skin:    Findings: No erythema or rash.  Neurological:     Mental Status: She is alert.  Psychiatric:        Mood and Affect: Mood normal.        Behavior: Behavior normal.     BP 126/82   Pulse 90   Temp 98.2 F (36.8 C) (Oral)   Resp 16   Wt 200 lb (90.7 kg)   SpO2 97%   BMI 35.43 kg/m  Wt Readings from Last 3 Encounters:  08/09/20 200 lb (90.7 kg)  05/19/19 178 lb (80.7 kg)  03/02/19 172 lb (78 kg)     Lab Results  Component Value Date   WBC 5.6 08/09/2020   HGB 12.2 08/09/2020   HCT 36.9 08/09/2020   PLT 313.0 08/09/2020   GLUCOSE 91 08/09/2020   CHOL 232 (H) 08/09/2020   TRIG 83.0 08/09/2020   HDL 61.70 08/09/2020   LDLCALC 153 (H) 08/09/2020   ALT 14 08/09/2020   AST 15 08/09/2020   NA 138 08/09/2020   K 4.9 08/09/2020   CL 104 08/09/2020   CREATININE 0.67 08/09/2020   BUN 18 08/09/2020   CO2 28 08/09/2020   TSH 1.27 08/09/2020    MR Brain W Wo Contrast  Result Date: 08/25/2019 CLINICAL DATA:  Headaches with dizziness and syncope for 2 months. EXAM: MRI HEAD WITHOUT AND WITH CONTRAST TECHNIQUE: Multiplanar, multiecho pulse sequences of the brain and surrounding structures were obtained without and with intravenous contrast. CONTRAST:  7.71mL GADAVIST GADOBUTROL 1 MMOL/ML IV SOLN COMPARISON:  None. FINDINGS: BRAIN: There is no acute infarct, acute hemorrhage or  extra-axial collection. The white matter signal is normal for the patient's age. The cerebral and cerebellar volume are age-appropriate. There is no hydrocephalus. The midline structures are normal. There is no abnormal contrast enhancement. VASCULAR: The major intracranial arterial and venous sinus flow voids are normal. Susceptibility-sensitive sequences show no chronic microhemorrhage or  superficial siderosis. SKULL AND UPPER CERVICAL SPINE: Calvarial bone marrow signal is normal. There is no skull base mass. The visualized upper cervical spine and soft tissues are normal. SINUSES/ORBITS: There are no fluid levels or advanced mucosal thickening. The mastoid air cells and middle ear cavities are free of fluid. The orbits are normal. IMPRESSION: Normal brain MRI. Electronically Signed   By: Ulyses Jarred M.D.   On: 08/25/2019 17:39       Assessment & Plan:   Problem List Items Addressed This Visit    Lack of concentration    Being treated for ADHD. On adderall.  Followed by Dr Nicolasa Ducking.  Stable.       Hypercholesteremia - Primary    Low cholesterol diet and exercise.  Follow lipid panel.       Relevant Orders   Lipid panel (Completed)   History of sensory changes    Describes the pin prick sensations, itching, etc.  Worse off antihistamine.  Question if allergy trigger.  She is concerned regarding possible autoimmune issue.  Check routine labs including ANA and ESR.  Further w/up pending results.        Relevant Orders   Sedimentation rate (Completed)   ANA (Completed)   Headache, menstrual migraine    Being followed by neurology.  Has not had SPG block in one year.  Doing well on current regimen as outlined.  Follow.        Relevant Medications   Ubrogepant (UBRELVY) 100 MG TABS   Fatigue   Relevant Orders   CBC with Differential/Platelet (Completed)   Hepatic function panel (Completed)   TSH (Completed)   Basic metabolic panel (Completed)   Anxiety    Overall doing well.  Sees Dr Nicolasa Ducking.  On cymbalta.  Follow.       Acid reflux    No upper symptoms reported.  Protonix.        Other Visit Diagnoses    Need for immunization against influenza       Relevant Orders   Flu Vaccine QUAD 36+ mos IM (Completed)       Einar Pheasant, MD

## 2020-08-10 ENCOUNTER — Other Ambulatory Visit: Payer: Self-pay | Admitting: Internal Medicine

## 2020-08-10 DIAGNOSIS — L299 Pruritus, unspecified: Secondary | ICD-10-CM

## 2020-08-10 NOTE — Progress Notes (Signed)
Order placed for referral to allergist.  °

## 2020-08-11 LAB — ANA: Anti Nuclear Antibody (ANA): NEGATIVE

## 2020-08-20 ENCOUNTER — Encounter: Payer: Self-pay | Admitting: Internal Medicine

## 2020-08-20 NOTE — Assessment & Plan Note (Signed)
No upper symptoms reported.  Protonix.  

## 2020-08-20 NOTE — Assessment & Plan Note (Signed)
Describes the pin prick sensations, itching, etc.  Worse off antihistamine.  Question if allergy trigger.  She is concerned regarding possible autoimmune issue.  Check routine labs including ANA and ESR.  Further w/up pending results.

## 2020-08-20 NOTE — Assessment & Plan Note (Signed)
Low cholesterol diet and exercise.  Follow lipid panel.   

## 2020-08-20 NOTE — Assessment & Plan Note (Signed)
Overall doing well.  Sees Dr Maryruth Bun.  On cymbalta.  Follow.

## 2020-08-20 NOTE — Assessment & Plan Note (Signed)
Being treated for ADHD. On adderall.  Followed by Dr Maryruth Bun.  Stable.

## 2020-08-20 NOTE — Assessment & Plan Note (Signed)
Being followed by neurology.  Has not had SPG block in one year.  Doing well on current regimen as outlined.  Follow.

## 2020-08-22 ENCOUNTER — Other Ambulatory Visit: Payer: Self-pay | Admitting: Internal Medicine

## 2020-08-29 ENCOUNTER — Encounter: Payer: Self-pay | Admitting: Internal Medicine

## 2020-09-23 ENCOUNTER — Other Ambulatory Visit: Payer: Self-pay

## 2020-09-23 ENCOUNTER — Ambulatory Visit: Payer: BC Managed Care – PPO | Attending: Internal Medicine

## 2020-09-23 DIAGNOSIS — Z23 Encounter for immunization: Secondary | ICD-10-CM

## 2020-09-23 NOTE — Progress Notes (Signed)
   Covid-19 Vaccination Clinic  Name:  Norva Bowe    MRN: [id number] DOB: Oct 16, 1976  09/23/2020  Ms. Markoff was observed post Covid-19 immunization for 15 minutes without incident. She was provided with Vaccine Information Sheet and instruction to access the V-Safe system.   Ms. Pfefferle was instructed to call 911 with any severe reactions post vaccine: Marland Kitchen Difficulty breathing  . Swelling of face and throat  . A fast heartbeat  . A bad rash all over body  . Dizziness and weakness   Immunizations Administered    Name Date Dose VIS Date Route   Pfizer COVID-19 Vaccine 09/23/2020 12:13 PM 0.3 mL 08/30/2020 Intramuscular   Manufacturer: [employer], [employer]   Lot: [id number]   NDC: [address]-2

## 2020-10-11 ENCOUNTER — Other Ambulatory Visit: Payer: BC Managed Care – PPO

## 2020-10-11 DIAGNOSIS — Z20822 Contact with and (suspected) exposure to covid-19: Secondary | ICD-10-CM

## 2020-10-12 LAB — NOVEL CORONAVIRUS, NAA: SARS-CoV-2, NAA: NOT DETECTED

## 2020-10-12 LAB — SARS-COV-2, NAA 2 DAY TAT

## 2020-11-08 ENCOUNTER — Ambulatory Visit: Payer: BC Managed Care – PPO | Admitting: Internal Medicine

## 2020-12-21 ENCOUNTER — Other Ambulatory Visit: Payer: Self-pay | Admitting: Internal Medicine

## 2021-06-01 ENCOUNTER — Other Ambulatory Visit: Payer: Self-pay | Admitting: Infectious Diseases

## 2021-06-01 MED ORDER — AMOXICILLIN-POT CLAVULANATE 875-125 MG PO TABS: 1.0000 | ORAL_TABLET | Freq: Two times a day (BID) | ORAL | 0 refills | Status: AC

## 2021-06-18 ENCOUNTER — Encounter: Payer: Self-pay | Admitting: Internal Medicine

## 2021-06-18 ENCOUNTER — Other Ambulatory Visit: Payer: Self-pay | Admitting: Internal Medicine

## 2021-06-18 ENCOUNTER — Other Ambulatory Visit: Payer: Self-pay

## 2021-06-18 ENCOUNTER — Ambulatory Visit: Payer: BC Managed Care – PPO | Admitting: Internal Medicine

## 2021-06-18 VITALS — BP 122/76 | HR 82 | Temp 97.8°F | Resp 16 | Ht 63.0 in | Wt 215.4 lb

## 2021-06-18 DIAGNOSIS — E7800 Pure hypercholesterolemia, unspecified: Secondary | ICD-10-CM

## 2021-06-18 DIAGNOSIS — Z6838 Body mass index (BMI) 38.0-38.9, adult: Secondary | ICD-10-CM

## 2021-06-18 DIAGNOSIS — T7840XD Allergy, unspecified, subsequent encounter: Secondary | ICD-10-CM

## 2021-06-18 DIAGNOSIS — T7840XA Allergy, unspecified, initial encounter: Secondary | ICD-10-CM | POA: Insufficient documentation

## 2021-06-18 DIAGNOSIS — R0981 Nasal congestion: Secondary | ICD-10-CM

## 2021-06-18 DIAGNOSIS — F419 Anxiety disorder, unspecified: Secondary | ICD-10-CM

## 2021-06-18 DIAGNOSIS — R21 Rash and other nonspecific skin eruption: Secondary | ICD-10-CM | POA: Insufficient documentation

## 2021-06-18 DIAGNOSIS — G43829 Menstrual migraine, not intractable, without status migrainosus: Secondary | ICD-10-CM

## 2021-06-18 DIAGNOSIS — K219 Gastro-esophageal reflux disease without esophagitis: Secondary | ICD-10-CM

## 2021-06-18 LAB — COMPREHENSIVE METABOLIC PANEL
ALT: 13 U/L (ref 0–35)
AST: 13 U/L (ref 0–37)
Albumin: 4.2 g/dL (ref 3.5–5.2)
Alkaline Phosphatase: 63 U/L (ref 39–117)
BUN: 19 mg/dL (ref 6–23)
CO2: 22 mEq/L (ref 19–32)
Calcium: 9.3 mg/dL (ref 8.4–10.5)
Chloride: 104 mEq/L (ref 96–112)
Creatinine, Ser: 0.68 mg/dL (ref 0.40–1.20)
GFR: 105.91 mL/min (ref >60.00)
Glucose, Bld: 84 mg/dL (ref 70–99)
Potassium: 4.2 mEq/L (ref 3.5–5.1)
Sodium: 137 mEq/L (ref 135–145)
Total Bilirubin: 0.3 mg/dL (ref 0.2–1.2)
Total Protein: 6.9 g/dL (ref 6.0–8.3)

## 2021-06-18 LAB — CBC WITH DIFFERENTIAL/PLATELET
Basophils Absolute: 0 10*3/uL (ref 0.0–0.1)
Basophils Relative: 0.3 % (ref 0.0–3.0)
Eosinophils Absolute: 0.1 10*3/uL (ref 0.0–0.7)
Eosinophils Relative: 1.8 % (ref 0.0–5.0)
HCT: 36.8 % (ref 36.0–46.0)
Hemoglobin: 12.1 g/dL (ref 12.0–15.0)
Lymphocytes Relative: 38 % (ref 12.0–46.0)
Lymphs Abs: 2.4 10*3/uL (ref 0.7–4.0)
MCHC: 32.8 g/dL (ref 30.0–36.0)
MCV: 86.6 fl (ref 78.0–100.0)
Monocytes Absolute: 0.4 10*3/uL (ref 0.1–1.0)
Monocytes Relative: 6.3 % (ref 3.0–12.0)
Neutro Abs: 3.4 10*3/uL (ref 1.4–7.7)
Neutrophils Relative %: 53.6 % (ref 43.0–77.0)
Platelets: 339 10*3/uL (ref 150.0–400.0)
RBC: 4.25 Mil/uL (ref 3.87–5.11)
RDW: 14.2 % (ref 11.5–15.5)
WBC: 6.4 10*3/uL (ref 4.0–10.5)

## 2021-06-18 LAB — LIPID PANEL
Cholesterol: 214 mg/dL — ABNORMAL HIGH (ref 0–200)
HDL: 52.4 mg/dL (ref >39.00)
LDL Cholesterol: 138 mg/dL — ABNORMAL HIGH (ref 0–99)
NonHDL: 161.33
Total CHOL/HDL Ratio: 4
Triglycerides: 117 mg/dL (ref 0.0–149.0)
VLDL: 23.4 mg/dL (ref 0.0–40.0)

## 2021-06-18 LAB — TSH: TSH: 1.87 u[IU]/mL (ref 0.35–5.50)

## 2021-06-18 NOTE — Progress Notes (Signed)
Patient ID: Carrie KoyanagiLaura [name], female   DOB: 04/22/1976, 45 y.o.   MRN: [id number]   Subjective:    Patient ID: Carrie KoyanagiLaura [name], female    DOB: 03/05/1976, 45 y.o.   MRN: [id number]  HPI This visit occurred during the SARS-CoV-2 public health emergency.  Safety protocols were in place, including screening questions prior to the visit, additional usage of staff PPE, and extensive cleaning of exam room while observing appropriate contact time as indicated for disinfecting solutions.   Patient here for a scheduled follow up.  Here to follow up regarding - allergies (persistent intermittent) and rash.  She has been in a new office - new building - October.  Had covid 4 weeks ago.  Sinus infection - took abx.  With congestion, headache and cough.  Vacation last week - symptoms improved.  Returned home - increased sinus pressure.  Also has noticed rash - neck, hands , etc. Discussed - antihistamine - xyzal.  Steroid nasal spray.  Discussed referral to an allergist.  No chest pain or sob reported.  No abdominal pain or bowel change reported.  GYN exams through [facility].    Past Medical History:  Diagnosis Date   Abnormal Pap smear    Allergy    Arthritis    t-12   Asthma    exercised induced   Depression    GERD (gastroesophageal reflux disease)    Headache(784.0)    Mononucleosis 1998   PONV (postoperative nausea and vomiting)    takes awhile to wake up   Past Surgical History:  Procedure Laterality Date   CARPAL TUNNEL RELEASE Bilateral 2016   CESAREAN SECTION N/A 10/30/2013   Procedure: CESAREAN SECTION;  Surgeon: Meriel Picaichard M Holland, MD;  Location: [facility];  Service: Obstetrics;  Laterality: N/A;  PRIMARY   CHOLECYSTECTOMY N/A 10/24/2017   Procedure: LAPAROSCOPIC CHOLECYSTECTOMY;  Surgeon: Lattie Hawooper, Richard E, MD;  Location: [facility];  Service: General;  Laterality: N/A;   COLPOSCOPY W/ BIOPSY / CURETTAGE     DE QUERVAIN'S RELEASE Left 2016   DILATION AND CURETTAGE OF UTERUS     FOOT  FRACTURE SURGERY Left    HAND SURGERY Left    Fracture repair   IVF     Family History  Problem Relation Age of Onset   Arthritis Mother    Diverticulitis Mother    Thyroid disease Mother    GER disease Mother    Migraines Mother    Arthritis Father    Hypertension Father    Hyperlipidemia Father    Thyroid disease Father    Diabetes Maternal Grandmother    Heart attack Maternal Grandmother    Alzheimer's disease Maternal Grandmother    Heart disease Paternal Grandfather    Heart attack Paternal Grandfather    Anemia Sister    Thyroid disease Sister    Healthy Brother    Emphysema Maternal Grandfather    Breast cancer Paternal Grandmother    Thyroid disease Sister    Bipolar disorder Sister    Arrhythmia Sister        Ventricular Tachycardia   Social History   Socioeconomic History   Marital status: Married    Spouse name: Not on file   Number of children: 1   Years of education: Master's   Highest education level: Not on file  Occupational History   Occupation: Part-time teacher    Comment: [employer]  Tobacco Use   Smoking status: Never   Smokeless tobacco: Never  Advertising account plannerVaping Use  Vaping Use: Never used  Substance and Sexual Activity   Alcohol use: No   Drug use: No   Sexual activity: Not Currently    Comment: couple is afraid  to mess something up  Other Topics Concern   Not on file  Social History Narrative   Not on file   Social Determinants of Health   Financial Resource Strain: Not on file  Food Insecurity: Not on file  Transportation Needs: Not on file  Physical Activity: Not on file  Stress: Not on file  Social Connections: Not on file    Review of Systems  Constitutional:  Negative for appetite change.       Reports weight gain.   HENT:  Positive for congestion and sinus pressure.   Respiratory:  Negative for cough, chest tightness and shortness of breath.   Cardiovascular:  Negative for chest pain, palpitations and leg swelling.   Gastrointestinal:  Negative for abdominal pain, diarrhea, nausea and vomiting.  Genitourinary:  Negative for difficulty urinating and dysuria.  Musculoskeletal:  Negative for joint swelling and myalgias.  Skin:  Positive for rash. Negative for color change.  Neurological:  Negative for dizziness, light-headedness and headaches.  Psychiatric/Behavioral:  Negative for agitation and dysphoric mood.       Objective:    Physical Exam Vitals reviewed.  Constitutional:      General: She is not in acute distress.    Appearance: Normal appearance.  HENT:     Head: Normocephalic and atraumatic.     Right Ear: External ear normal.     Left Ear: External ear normal.  Eyes:     General: No scleral icterus.       Right eye: No discharge.        Left eye: No discharge.     Conjunctiva/sclera: Conjunctivae normal.  Neck:     Thyroid: No thyromegaly.  Cardiovascular:     Rate and Rhythm: Normal rate and regular rhythm.  Pulmonary:     Effort: No respiratory distress.     Breath sounds: Normal breath sounds. No wheezing.  Abdominal:     General: Bowel sounds are normal.     Palpations: Abdomen is soft.     Tenderness: There is no abdominal tenderness.  Musculoskeletal:        General: No swelling or tenderness.     Cervical back: Neck supple. No tenderness.  Lymphadenopathy:     Cervical: No cervical adenopathy.  Skin:    General: Skin is warm.     Findings: No erythema.  Neurological:     Mental Status: She is alert.  Psychiatric:        Mood and Affect: Mood normal.        Behavior: Behavior normal.    BP 122/76   Pulse 82   Temp 97.8 F (36.6 C)   Resp 16   Ht 5\' 3"  (1.6 m)   Wt 215 lb 6.4 oz (97.7 kg)   SpO2 98%   BMI 38.16 kg/m  Wt Readings from Last 3 Encounters:  06/18/21 215 lb 6.4 oz (97.7 kg)  08/09/20 200 lb (90.7 kg)  05/19/19 178 lb (80.7 kg)    Outpatient Encounter Medications as of 06/18/2021  Medication Sig   acetaminophen (TYLENOL) 500 MG tablet Take  1,000-1,500 mg by mouth every 6 (six) hours as needed for moderate pain or headache.    albuterol (PROAIR HFA) 108 (90 Base) MCG/ACT inhaler Inhale 2 puffs into the lungs every 6 (six) hours  as needed for wheezing or shortness of breath.   amphetamine-dextroamphetamine (ADDERALL) 20 MG tablet Take 20 mg by mouth 2 (two) times daily.   CALCIUM PO Take 1 tablet by mouth daily.   fluticasone (FLONASE) 50 MCG/ACT nasal spray PLACE 1 SPRAY INTO BOTH NOSTRILS AT BEDTIME.   ibuprofen (ADVIL,MOTRIN) 200 MG tablet Take 600 mg by mouth every 6 (six) hours as needed for headache or moderate pain.   magnesium oxide (MAG-OX) 400 (241.3 Mg) MG tablet TAKE 1 TABLET EVERY DAY (Patient taking differently: Take 400 mg by mouth once daily)   Omega-3 Fatty Acids (FISH OIL BURP-LESS PO) Take 1 capsule by mouth daily.    propranolol (INDERAL) 20 MG tablet Take 1 tablet (20 mg total) by mouth 3 (three) times daily as needed.   rizatriptan (MAXALT) 10 MG tablet Take 10 mg by mouth daily as needed for migraine.    Ubrogepant (UBRELVY) 100 MG TABS Take by mouth.   [DISCONTINUED] Cholecalciferol (VITAMIN D PO) Take 2 capsules by mouth daily.    [DISCONTINUED] loratadine (CLARITIN) 10 MG tablet Take 10 mg by mouth daily as needed.    [DISCONTINUED] methylphenidate 54 MG PO CR tablet Take 54 mg by mouth daily.   [DISCONTINUED] pantoprazole (PROTONIX) 40 MG tablet TAKE 1 TABLET BY MOUTH EVERY DAY   No facility-administered encounter medications on file as of 06/18/2021.     Lab Results  Component Value Date   WBC 6.4 06/18/2021   HGB 12.1 06/18/2021   HCT 36.8 06/18/2021   PLT 339.0 06/18/2021   GLUCOSE 84 06/18/2021   CHOL 214 (H) 06/18/2021   TRIG 117.0 06/18/2021   HDL 52.40 06/18/2021   LDLCALC 138 (H) 06/18/2021   ALT 13 06/18/2021   AST 13 06/18/2021   NA 137 06/18/2021   K 4.2 06/18/2021   CL 104 06/18/2021   CREATININE 0.68 06/18/2021   BUN 19 06/18/2021   CO2 22 06/18/2021   TSH 1.87 06/18/2021     MR Brain W Wo Contrast  Result Date: 08/25/2019 CLINICAL DATA:  Headaches with dizziness and syncope for 2 months. EXAM: MRI HEAD WITHOUT AND WITH CONTRAST TECHNIQUE: Multiplanar, multiecho pulse sequences of the brain and surrounding structures were obtained without and with intravenous contrast. CONTRAST:  7.41mL GADAVIST GADOBUTROL 1 MMOL/ML IV SOLN COMPARISON:  None. FINDINGS: BRAIN: There is no acute infarct, acute hemorrhage or extra-axial collection. The white matter signal is normal for the patient's age. The cerebral and cerebellar volume are age-appropriate. There is no hydrocephalus. The midline structures are normal. There is no abnormal contrast enhancement. VASCULAR: The major intracranial arterial and venous sinus flow voids are normal. Susceptibility-sensitive sequences show no chronic microhemorrhage or superficial siderosis. SKULL AND UPPER CERVICAL SPINE: Calvarial bone marrow signal is normal. There is no skull base mass. The visualized upper cervical spine and soft tissues are normal. SINUSES/ORBITS: There are no fluid levels or advanced mucosal thickening. The mastoid air cells and middle ear cavities are free of fluid. The orbits are normal. IMPRESSION: Normal brain MRI. Electronically Signed   By: Deatra Robinson M.D.   On: 08/25/2019 17:39       Assessment & Plan:   Problem List Items Addressed This Visit     Acid reflux    No upper symptoms reported.  Continue protonix.       Allergies    Allergies as outlined.  Persistent intermittent flare.  Questioning if related to her new building, something at home, etc.  Refer to allergist  for further evaluation.  Xyzal.  Steroid nasal spray.        Relevant Orders   Ambulatory referral to Allergy   Anxiety    Seeing Dr Maryruth Bun. On cymbalta.  Follow.  Stable.       Body mass index (BMI) of 38.0 to 38.9 in adult    Is interested in weight loss medication.  Discussed diet and exercise.  Discussed GLP-1 agonsit.  She is  interested in possible pursuing.  CCM referral placed.       Relevant Orders   [facility]B [facility] to [facility]   Congestion of nasal sinus    Persistent (intermittent) congestion and drainage.  Was questioning if related to her new building or something at home.  Better at beach.  Discussed (has tried ) antihistamine - xyzal.  Steroid nasal spray/saline nasal spray.  Given persistence, refer to an allergist for further evaluation and w/up.       Relevant Orders   Ambulatory referral to Allergy   Headache, menstrual migraine    Has seen neurology.  Appears to be stable.        Hypercholesteremia - Primary    Low cholesterol diet and exercise.  Follow lipid panel.       Relevant Orders   CBC with Differential/Platelet (Completed)   Comprehensive metabolic panel (Completed)   Lipid panel (Completed)   TSH (Completed)   Rash    Persistent - intermittent.  No clear triggers.  F/u with allergist first.  Follow for triggers.  Xyzal.       Relevant Orders   Ambulatory referral to Allergy     Dale Normandy, MD

## 2021-06-25 ENCOUNTER — Encounter: Payer: Self-pay | Admitting: Internal Medicine

## 2021-06-25 DIAGNOSIS — Z6838 Body mass index (BMI) 38.0-38.9, adult: Secondary | ICD-10-CM | POA: Insufficient documentation

## 2021-06-25 DIAGNOSIS — R0981 Nasal congestion: Secondary | ICD-10-CM | POA: Insufficient documentation

## 2021-06-25 NOTE — Assessment & Plan Note (Signed)
Persistent - intermittent.  No clear triggers.  F/u with allergist first.  Follow for triggers.  Xyzal.

## 2021-06-25 NOTE — Assessment & Plan Note (Signed)
Allergies as outlined.  Persistent intermittent flare.  Questioning if related to her new building, something at home, etc.  Refer to allergist for further evaluation.  Xyzal.  Steroid nasal spray.

## 2021-06-25 NOTE — Assessment & Plan Note (Signed)
Persistent (intermittent) congestion and drainage.  Was questioning if related to her new building or something at home.  Better at beach.  Discussed (has tried ) antihistamine - xyzal.  Steroid nasal spray/saline nasal spray.  Given persistence, refer to an allergist for further evaluation and w/up.

## 2021-06-25 NOTE — Assessment & Plan Note (Signed)
Seeing Dr Maryruth Bun. On cymbalta.  Follow.  Stable.

## 2021-06-25 NOTE — Assessment & Plan Note (Signed)
Low cholesterol diet and exercise.  Follow lipid panel.   

## 2021-06-25 NOTE — Assessment & Plan Note (Signed)
No upper symptoms reported.  Continue protonix.  

## 2021-06-25 NOTE — Assessment & Plan Note (Signed)
Has seen neurology.  Appears to be stable.

## 2021-06-25 NOTE — Assessment & Plan Note (Signed)
Is interested in weight loss medication.  Discussed diet and exercise.  Discussed GLP-1 agonsit.  She is interested in possible pursuing.  CCM referral placed.

## 2021-06-26 ENCOUNTER — Telehealth: Payer: Self-pay

## 2021-06-26 NOTE — Chronic Care Management (AMB) (Signed)
  Care Management   Outreach Note  06/26/2021 Name: Carrie [name] MRN: [id number] DOB: March 01, 1976  Referred by: Dale Aberdeen, MD Reason for referral : Care Coordination (Outreach to schedule referral for Pharm D )   An unsuccessful telephone outreach was attempted today. The patient was referred to the case management team for assistance with care management and care coordination.   Follow Up Plan:  A HIPAA compliant phone message was left for the patient providing contact information and requesting a return call.  The care management team will reach out to the patient again over the next 5 days.  If patient returns call to provider office, please advise to call Embedded Care Management Care Guide Penne Lash at [phone]  Penne Lash, RMA Care Guide, Embedded Care Coordination [facility]  [place], [place] [address] Direct Dial: [phone] [email or web address] Website: [email or web address]

## 2021-07-04 NOTE — Chronic Care Management (AMB) (Signed)
  Care Management   Note  07/04/2021 Name: Carrie [name] MRN: [id number] DOB: [birth year]  Carrie [name] is a 45 y.o. year old female who is a primary care patient of Dale Lyncourt, MD. I reached out to [name] by phone today in response to a referral sent by [name]'s health plan.    [name] was given information about care management services today including:  Care management services include personalized support from designated clinical staff supervised by her physician, including individualized plan of care and coordination with other care providers 24/7 contact phone numbers for assistance for urgent and routine care needs. The patient may stop care management services at any time by phone call to the office staff.  Patient agreed to services and verbal consent obtained.   Follow up plan: Telephone appointment with care management team member scheduled for:07/11/2021  Penne Lash, RMA Care Guide, Embedded Care Coordination [facility]  [place], [place] [address] Direct Dial: [phone] [email or web address]: [email or web address]

## 2021-07-11 ENCOUNTER — Ambulatory Visit: Payer: Self-pay | Admitting: Pharmacist

## 2021-07-11 DIAGNOSIS — E7800 Pure hypercholesterolemia, unspecified: Secondary | ICD-10-CM

## 2021-07-11 DIAGNOSIS — G43019 Migraine without aura, intractable, without status migrainosus: Secondary | ICD-10-CM

## 2021-07-11 DIAGNOSIS — Z6838 Body mass index (BMI) 38.0-38.9, adult: Secondary | ICD-10-CM

## 2021-07-11 MED ORDER — TIRZEPATIDE 2.5 MG/0.5ML ~~LOC~~ SOAJ: 2.5000 mg | SUBCUTANEOUS | 0 refills | Status: AC

## 2021-07-11 MED ORDER — TIRZEPATIDE 5 MG/0.5ML ~~LOC~~ SOAJ: 5.0000 mg | SUBCUTANEOUS | 1 refills | Status: DC

## 2021-07-11 NOTE — Chronic Care Management (AMB) (Signed)
Care Management   Pharmacy Note  07/11/2021 Name: Carrie [name] MRN: [id number] DOB: [birth year]  Subjective: Carrie [name] is a 45 y.o. year old female who is a primary care patient of Dale Carte[place], MD. The Care Management team was consulted for assistance with care management and care coordination needs.    Engaged with patient by telephone for initial visit in response to provider referral for pharmacy case management and/or care coordination services.   The patient was given information about Care Management services today including:  Care Management services includes personalized support from designated clinical staff supervised by the patient's primary care provider, including individualized plan of care and coordination with other care providers. 24/7 contact phone numbers for assistance for urgent and routine care needs. The patient may stop case management services at any time by phone call to the office staff.  Patient agreed to services and consent obtained.  Assessment:  Review of patient status, including review of consultants reports, laboratory and other test data, was performed as part of comprehensive evaluation and provision of chronic care management services.   SDOH (Social Determinants of Health) assessments and interventions performed:  SDOH Interventions    Flowsheet Row Most Recent Value  SDOH Interventions   Financial Strain Interventions Intervention Not Indicated        Objective:  Lab Results  Component Value Date   CREATININE 0.68 06/18/2021   CREATININE 0.67 08/09/2020   CREATININE 0.71 03/22/2019    No results found for: HGBA1C     Component Value Date/Time   CHOL 214 (H) 06/18/2021 0820   TRIG 117.0 06/18/2021 0820   HDL 52.40 06/18/2021 0820   CHOLHDL 4 06/18/2021 0820   VLDL 23.4 06/18/2021 0820   LDLCALC 138 (H) 06/18/2021 0820     Clinical ASCVD: No  The 10-year ASCVD risk score Denman George DC Jr., et al., 2013) is: 0.8%   Values used  to calculate the score:     Age: 89 years     Sex: Female     Is Non-Hispanic African American: No     Diabetic: No     Tobacco smoker: No     Systolic Blood Pressure: 122 mmHg     Is BP treated: No     HDL Cholesterol: 52.4 mg/dL     Total Cholesterol: 214 mg/dL   BP Readings from Last 3 Encounters:  06/18/21 122/76  08/20/20 126/82  05/19/19 136/82    Care Plan  No Known Allergies  Medications Reviewed Today     Reviewed by Lourena Simmonds, RPH-CPP (Pharmacist) on 07/11/21 at 1120  Med List Status: <None>   Medication Order Taking? Sig Documenting Provider Last Dose Status Informant  acetaminophen (TYLENOL) 500 MG tablet [id number] Yes Take 1,000-1,500 mg by mouth every 6 (six) hours as needed for moderate pain or headache.  [provider] Taking Active Self  albuterol (PROAIR HFA) 108 (90 Base) MCG/ACT inhaler [id number] Yes Inhale 2 puffs into the lungs every 6 (six) hours as needed for wheezing or shortness of breath. Dale [place], MD Taking Active   amphetamine-dextroamphetamine (ADDERALL) 20 MG tablet [id number] Yes Take 20 mg by mouth 2 (two) times daily. [provider] Taking Active   CALCIUM PO [id number] Yes Take 1 tablet by mouth daily. [provider] Taking Active Self  fluticasone (FLONASE) 50 MCG/ACT nasal spray [id number] Yes PLACE 1 SPRAY INTO BOTH NOSTRILS AT BEDTIME. Dale [place], MD Taking Active   Fremanezumab-vfrm (AJOVY) 225 MG/1.5ML Ivory Broad [id number] Yes Inject 225  mg into the skin every 30 (thirty) days. [provider] Taking Active   ibuprofen (ADVIL,MOTRIN) 200 MG tablet [id number] Yes Take 600 mg by mouth every 6 (six) hours as needed for headache or moderate pain. [provider] Taking Active Self  magnesium oxide (MAG-OX) 400 (241.3 Mg) MG tablet [id number] Yes TAKE 1 TABLET EVERY DAY Dale [place], MD Taking Active Self  Omega-3 Fatty Acids (FISH OIL BURP-LESS PO) [id number] Yes Take 1 capsule by mouth  daily.  [provider] Taking Active Self  pantoprazole (PROTONIX) 40 MG tablet [id number] Yes TAKE 1 TABLET BY MOUTH EVERY DAY Dale [place], MD Taking Active   propranolol (INDERAL) 20 MG tablet [id number] No Take 1 tablet (20 mg total) by mouth 3 (three) times daily as needed.  Patient not taking: Reported on 07/11/2021   Antonieta Iba, MD Not Taking Active   SUMAtriptan (IMITREX) 100 MG tablet [id number] Yes Take 100 mg by mouth as needed. [provider] Taking Active   Ubrogepant (UBRELVY) 100 MG TABS [id number] Yes Take by mouth. [provider] Taking Active             Patient Active Problem List   Diagnosis Date Noted   Congestion of nasal sinus 06/25/2021   Body mass index (BMI) of 38.0 to 38.9 in adult 06/25/2021   Rash 06/18/2021   Allergies 06/18/2021   History of sensory changes 08/09/2020   Fatigue 08/09/2020   Fall 12/05/2019   Dizziness 08/01/2019   Fever 07/27/2019   Breast pain, left 03/21/2019   Chest pain 12/23/2018   PVC (premature ventricular contraction) 05/17/2018   Tachycardia 03/25/2018   Healthcare maintenance 03/07/2018   Anxiety 03/07/2018   Carpal tunnel syndrome 10/06/2017   Radial styloid tenosynovitis 10/06/2017   Calculus of gallbladder with chronic cholecystitis without obstruction    Acute recurrent maxillary sinusitis 10/21/2016   Lack of concentration 09/29/2016   Closed displaced fracture of second metatarsal bone of left foot 06/18/2016   Adaptation reaction 06/13/2015   Allergic rhinitis 06/13/2015   H/O arthritis 06/13/2015   Airway hyperreactivity 06/13/2015   Acid reflux 06/13/2015   Common migraine with intractable migraine 06/13/2015   Hypercholesteremia 06/13/2015   Headache, menstrual migraine 06/13/2015   Pregnancy 10/30/2013    Conditions to be addressed/monitored:  Obesity, Migraines  Care Plan : Medication Management  Updates made by Lourena Simmonds, RPH-CPP since 07/11/2021 12:00  AM     Problem: Obesity, Migraines      Long-Range Goal: Disease Progression Prevention   Start Date: 07/11/2021  This Visit's Progress: On track  Priority: High  Note:   Current Barriers:  Unable to achieve control of weight   Pharmacist Clinical Goal(s):  Over the next 90 days, patient will achieve 5-10% loss from baseline through collaboration with PharmD and provider.   Interventions: 1:1 collaboration with Dale [place], MD regarding development and update of comprehensive plan of care as evidenced by provider attestation and co-signature Inter-disciplinary care team collaboration (see longitudinal plan of care) Comprehensive medication review performed; medication list updated in electronic medical record  Overweight/Obesity Complicated by Asthma: Unable to achieve goal weight loss through lifestyle modification alone; current treatment: none;  Medications/Strategies previously tried: None specifically for weight, but has tried Topiramate for migraines  - but did not tolerate due to CNS sedation Metformin for fertility - reports GI tolerability concerns with immediate release metformin Baseline weight: 215 lbs;  Current meal patterns: breakfast: cereal - honey nut cheerios and banana, recently ninja  bullet to have a smoothie; oatmeal, peanut butter, milk; coffee; lunch: 1/2 sandwich - [place] and cheese, recently has been skipping bread and doing [place] and cheese roll ups; dinner: salad, sandwich; snacks: ice cream at night, frozen watermelon; drinks: water, sometimes coke zero periodically; occasional chips and dip with supper, but trying to limit for daughter's health  Current exercise: limited by work, 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 No personal or family history of medullary thyroid cancer, personal history of pancreatitis, and history of cholecystectomy. Counseled on potential side effects of nausea, stomach upset, queasiness, constipation, and that these generally improve over time. Advised to contact our office with more severe symptoms, including nausea, diarrhea, stomach pain. Patient verbalized understanding. Confirmed patient is not pregnant and not currently on oral contraceptives.  Discussed approval of Wegovy for weight management, however, discussed current lack of availability due to back order. Discussed use of semaglutide through [facility], however, availability concerns also exist right now due to shortage. Discussed off label use of Mounjaro for weight management due to results from the [facility] clinical trial showing efficacy of tirzepatide for weight management. Discussed potential ramifications of off label use. Patient amenable to investigating insurance coverage and cost. Recommend to start Mounjaro 2.5 mg weekly for 4 weeks, then increase to 5 mg weekly. Collaboration with provider on off-label prescribing. Will provide sample of 2.5 mg pens. Discussed investigation of Mounjaro savings card available from manufacturer website that should assist in lowering copay to $25 per month. Patient will come by either tomorrow or next week to pick up sample.   Migraines: Moderately well controlled; current regimen: Ajovy 225 mg Q30 days; magnesium oxide 400 mg daily, Ubrelvy 100 mg PRN, sumatriptan 300 mg PRN, acetaminophen 325 mg PRN, ibuprofen 200 mg PRN - though no use of analgesics lately; follows w/ Dr. Sherryll Burger at [facility] Recommended to continue current regimen at this time along with collaboration with neurology  Asthma/Allergies: Uncontrolled per patient report, upcoming visit with  allergist; current regimen: albuterol HFA PRN, fluticasone nasal spray PRN, levocetirizine 5 mg PRN Recommended to continue current regimen at this time  ADHD: Managed by psychiatry; current regimen: adderall 20 mg BID Recommended to continue current regimen at this time  GERD: Controlled; current regimen: pantoprazole 40 mg daily Recommended to continue current regimen at this time. Consider de-escalation to H2RA therapy moving forward.    Patient Goals/Self-Care Activities Over the next 90 days, patient will:  - take medications as prescribed target a minimum of 150 minutes of moderate intensity exercise weekly engage in dietary modifications by moderating portion sizes  Follow Up Plan: Telephone follow up appointment with care management team member scheduled for: ~ 6 weeks      Medication Assistance:  None required.  Patient affirms current coverage meets needs.  Follow Up:  Patient agrees to Care Plan and Follow-up.  Plan: Telephone follow up appointment with care management team member scheduled for:  ~ 6 weeks  Catie Feliz Beam, PharmD, [place], CPP Clinical Pharmacist [facility] [employer] [phone]

## 2021-07-11 NOTE — Patient Instructions (Addendum)
Visit Information  PATIENT GOALS:   Goals Addressed               This Visit's Progress     Patient Stated     Medication Monitoring (pt-stated)        Patient Goals/Self-Care Activities Over the next 90 days, patient will:  - take medications as prescribed target a minimum of 150 minutes of moderate intensity exercise weekly engage in dietary modifications by moderating portion sizes        [name] was given information about Care Management services by the embedded care coordination team including:  Care Management services include personalized support from designated clinical staff supervised by her physician, including individualized plan of care and coordination with other care providers 24/7 contact phone numbers for assistance for urgent and routine care needs. The patient may stop CCM services at any time (effective at the end of the month) by phone call to the office staff.  Patient agreed to services and verbal consent obtained.   Patient verbalizes understanding of instructions provided today and agrees to view in MyChart.   Plan: Telephone follow up appointment with care management team member scheduled for:  ~ 6 weeks   Catie Feliz Beam, PharmD, [place], CPP Clinical Pharmacist [facility] [employer] [phone]

## 2021-07-19 NOTE — Progress Notes (Signed)
NEW PATIENT Date of Service/Encounter:  07/20/21 Referring provider: Dale Spade, MD Primary care provider: Dale D[place], MD  Subjective:  Carrie [name] is a 45 y.o. female with a PMHx of chronic rhinitis, GERD, migraines, hypercholesterolemia, anxiety, rash presenting today for evaluation of rash, and nasal issues. History obtained from: chart review and patient.   Chronic rhinitis: started as an adult, symptoms include: nasal congestion, rhinorrhea, post nasal drainage, sneezing, watery eyes, itchy eyes ,itchy nose occurs year-round with seasonal flares in Fall and Spring Potential triggers: new office building Treatments tried: antihistamine daily and flonase 1-2 sprays in each nostril Previous allergy testing: yes-around 2002 when she first moved to [facility]  Believes it was negative. History of reflux/heartburn: yes-takes pantoprazole 40 mg daily, symptoms overall controlled She also has migraines and takes medicines for this.    Rash: started October 2021, occurring several times weekly, associates extreme itchiness. Rash moves around, and is worse when she doesn't take her allergy antihistamine. Denies other systemic symptoms including no respiratory, gastrointestinal or cardiovascular distress. No changes to personal care products, detergents, etc Therapies tried: OTC AH (Xyzal currently) one daily, has switched different brands Does not associate fever, joint pain, joint swelling, weight loss.  Lesions resolve in about one day  Occasional bruising on resolution, but thought related to itching. No recent illness. Pictures reviewed and some are erythematous and edematous persistent with hives, some linear persistent with dermatographism In the past, when she was on Celexa would get a similar but more intense rash.  This stopped once she stopped the medication. Reviewed recent TSH, CBCd and CMP which were all reassuring and within normal limits (06/18/21)  Asthma: Diagnosed  as a child. Typically only exercise induced. Recently symptoms have worsened and include chest tightness, cough, shortness of breath, wheezing 2-3 daytime symptoms in past month, 3 nighttime awakenings in past month using rescue inhaler 3 times in the last week, minimal prior to this but recuperating from recent COVID-19 infection and prior to that sinus infection (July - August) Most of her coughing comes from a tickle in her throat which provokes coughing which is difficult to control and leads to shortness of breath Identified Triggers:  hot air, exercise respiratory illness smoke exposure cold air Previously used therapies: Advair diskus in the past (before COVID).  Current regimen:  Rescue: Albuterol 2 puffs q4-6 hrs PRN, using prior to exercise Up-to-date with Covid-19, Flu, vaccines. History of prior pneumonias: no History of prior COVID-19 infection: yes Smoking exposure: none  Other allergy screening: Food allergy: no Medication allergy: yes-Celexa hives Hymenoptera allergy: no.   Past Medical History: Past Medical History:  Diagnosis Date   Abnormal Pap smear    Allergy    Arthritis    t-12   Asthma    exercised induced   Depression    GERD (gastroesophageal reflux disease)    Headache(784.0)    Mononucleosis 1998   PONV (postoperative nausea and vomiting)    takes awhile to wake up   Medication List:  Current Outpatient Medications  Medication Sig Dispense Refill   acetaminophen (TYLENOL) 500 MG tablet Take 1,000-1,500 mg by mouth every 6 (six) hours as needed for moderate pain or headache.      albuterol (PROAIR HFA) 108 (90 Base) MCG/ACT inhaler Inhale 2 puffs into the lungs every 6 (six) hours as needed for wheezing or shortness of breath. 1 Inhaler 2   amphetamine-dextroamphetamine (ADDERALL) 20 MG tablet Take 20 mg by mouth 2 (two) times daily.  CALCIUM PO Take 1 tablet by mouth daily.     fluticasone (FLONASE) 50 MCG/ACT nasal spray PLACE 1 SPRAY INTO  BOTH NOSTRILS AT BEDTIME. 48 mL 2   Fremanezumab-vfrm (AJOVY) 225 MG/1.5ML SOAJ Inject 225 mg into the skin every 30 (thirty) days.     ibuprofen (ADVIL,MOTRIN) 200 MG tablet Take 600 mg by mouth every 6 (six) hours as needed for headache or moderate pain.     levocetirizine (XYZAL) 5 MG tablet Take 5 mg by mouth daily.     magnesium oxide (MAG-OX) 400 (241.3 Mg) MG tablet TAKE 1 TABLET EVERY DAY 30 tablet 1   Omega-3 Fatty Acids (FISH OIL BURP-LESS PO) Take 1 capsule by mouth daily.      pantoprazole (PROTONIX) 40 MG tablet TAKE 1 TABLET BY MOUTH EVERY DAY 90 tablet 1   propranolol (INDERAL) 20 MG tablet Take 1 tablet (20 mg total) by mouth 3 (three) times daily as needed. 90 tablet 3   SUMAtriptan (IMITREX) 100 MG tablet Take 100 mg by mouth as needed.     tirzepatide [facility]) 2.5 MG/0.5ML Pen Inject 2.5 mg into the skin once a week for 4 doses. 2 mL 0   [START ON 08/03/2021] tirzepatide (MOUNJARO) 5 MG/0.5ML Pen Inject 5 mg into the skin once a week. 2 mL 1   Ubrogepant (UBRELVY) 100 MG TABS Take by mouth.     No current facility-administered medications for this visit.   Known Allergies:  Allergies  Allergen Reactions   Citalopram Hives, Itching and Rash   Past Surgical History: Past Surgical History:  Procedure Laterality Date   CARPAL TUNNEL RELEASE Bilateral 2016   CESAREAN SECTION N/A 10/30/2013   Procedure: CESAREAN SECTION;  Surgeon: Meriel Pica, MD;  Location: [facility];  Service: Obstetrics;  Laterality: N/A;  PRIMARY   CHOLECYSTECTOMY N/A 10/24/2017   Procedure: LAPAROSCOPIC CHOLECYSTECTOMY;  Surgeon: Lattie Haw, MD;  Location: [facility] OR[facility];  Service: General;  Laterality: N/A;   COLPOSCOPY W/ BIOPSY / CURETTAGE     DE QUERVAIN'S RELEASE Left 2016   DILATION AND CURETTAGE OF UTERUS     FOOT FRACTURE SURGERY Left    HAND SURGERY Left    Fracture repair   IVF     Family History: Family History  Problem Relation Age of Onset   Asthma Mother    Arthritis Mother     Diverticulitis Mother    Thyroid disease Mother    GER disease Mother    Migraines Mother    Arthritis Father    Hypertension Father    Hyperlipidemia Father    Thyroid disease Father    Allergic rhinitis Sister    Anemia Sister    Thyroid disease Sister    Thyroid disease Sister    Bipolar disorder Sister    Arrhythmia Sister        Ventricular Tachycardia   Healthy Brother    Diabetes Maternal Grandmother    Heart attack Maternal Grandmother    Alzheimer's disease Maternal Grandmother    Emphysema Maternal Grandfather    Breast cancer Paternal Grandmother    Heart disease Paternal Grandfather    Heart attack Paternal Grandfather    Social History: Stepahnie lives in a home built 16 years ago, no water damage, carpet inside bedroom, gas heating, central AC, too many goldendoodle's as pets, no visible cockroaches, not using dust mite protection on bedding, no smoke exposure, 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  [place]   ROS:  All other systems negative except as noted per HPI.  Objective:  Blood pressure 122/82, pulse 91, temperature 98.7 F (37.1 C), temperature source Temporal, resp. rate 18, height 5' 2.75" (1.594 m), weight 215 lb 12.8 oz (97.9 kg), SpO2 100 %, unknown if currently breastfeeding. Body mass index is 38.53 kg/m. Physical Exam:  General Appearance:  Alert, cooperative, no distress, appears stated age, heard clearing throat multiple times during encounter  Head:  Normocephalic, without obvious abnormality, atraumatic  Eyes:  Conjunctiva clear, EOM's intact  Nose: Nares normal, hypertropic turbinates (R>L)  Throat: Lips, tongue normal; teeth and gums normal, normal posterior oropharynx  Neck: Supple, symmetrical  Lungs:   CTAB, no wheezing, Respirations unlabored, persistent dry upper airway sounding cough  Heart:  RRR, no murmur, Appears well perfused  Extremities: No edema  Skin: Skin color, texture,  turgor normal, no rashes or lesions on visualized portions of skin  Neurologic: No gross deficits   Diagnostics: Spirometry:  Tracings reviewed. Her effort: Good reproducible efforts. FVC: 3.89L (pre), 3.89 L (post)  FEV1: 3.14L, 97% predicted,  3.10 L, 95% (post)  FEV1/FVC ratio: 103 %,  101% (post)  Interpretation: Spirometry consistent with normal pattern with no significant bronchodilator response. Please see scanned spirometry results for details.  Skin Testing: Environmental allergy panel.  Skin testing negative. IDs were also negative. Results discussed with patient/family.  Airborne Adult Perc - 07/20/21 1034     Time Antigen Placed 7867    Allergen Manufacturer Waynette Buttery    Location Back    Number of Test 59    1. Control-Buffer 50% Glycerol Negative    2. Control-Histamine 1 mg/ml 3+    3. Albumin saline Negative    4. Bahia Negative    5. French Southern Territories Negative    6. Johnson Negative    7. Kentucky Blue Negative    8. Meadow Fescue Negative    9. Perennial Rye Negative    10. Sweet Vernal Negative    11. Timothy Negative    12. Cocklebur Negative    13. Burweed Marshelder Negative    14. Ragweed, short Negative    15. Ragweed, Giant Negative    16. Plantain,  English Negative    17. Lamb's Quarters Negative    18. Sheep Sorrell Negative    19. Rough Pigweed Negative    20. Marsh Elder, Rough Negative    21. Mugwort, Common Negative    22. Ash mix Negative    23. Birch mix Negative    24. Beech American Negative    25. Box, Elder Negative    26. Cedar, red Negative    27. Cottonwood, Guinea-Bissau Negative    28. Elm mix Negative    29. Hickory Negative    30. Maple mix Negative    31. Oak, Guinea-Bissau mix Negative    32. Pecan Pollen Negative    33. Pine mix Negative    34. Sycamore Eastern Negative    35. Walnut, Black Pollen Negative    36. Alternaria alternata Negative    37. Cladosporium Herbarum Negative    38. Aspergillus mix Negative    39. Penicillium mix  Negative    40. Bipolaris sorokiniana (Helminthosporium) Negative    41. Drechslera spicifera (Curvularia) Negative    42. Mucor plumbeus Negative    43. Fusarium moniliforme Negative    44. Aureobasidium pullulans (pullulara) Negative    45. Rhizopus oryzae Negative    46. Botrytis cinera Negative  47. Epicoccum nigrum Negative    48. Phoma betae Negative    49. Candida Albicans Negative    50. Trichophyton mentagrophytes Negative    51. Mite, D Farinae  5,000 AU/ml Negative    52. Mite, D Pteronyssinus  5,000 AU/ml Negative    53. Cat Hair 10,000 BAU/ml Negative    54.  Dog Epithelia Negative    55. Mixed Feathers Negative    56. Horse Epithelia Negative    57. Cockroach, German Negative    58. Mouse Negative    59. Tobacco Leaf Negative             Intradermal - 07/20/21 1035     Time Antigen Placed 1035    Allergen Manufacturer Waynette ButteryGreer    Location Arm    Number of Test 15    Intradermal Select    Control Negative    French Southern TerritoriesBermuda Negative    Johnson Negative    7 Grass Negative    Ragweed mix Negative    Weed mix Negative    Tree mix Negative    Mold 1 Negative    Mold 2 Negative    Mold 3 Negative    Mold 4 Negative    Cat Negative    Dog Negative    Cockroach Negative    Mite mix Negative             Allergy testing results were read and interpreted by myself, documented by clinical staff.  Assessment:  Chronic idiopathic urticaria In this case, without any obvious triggers, history most consistent with chronic idiopathic urticaria which is an intrinsic process and  typically not resultant of an underlying allergy.  Considered food/medication allergies, but these are usually obvious, occur within 2 hours of consumption, and resolve within 24 hours.  They also recur with every subsequent exposure.  We also discussed that environmental allergies rarely cause urticaria, and again, trigger is usually obvious.  Both her skin testing and intradermal's were  negative today.  Other considerations unlikely based on history include vasculitic urticaria or autoimmune disease, but given lack of associated symptoms/signs, no indication to pursue furhter work-up for either.  Chronic nonallergic rhinitis Skin testing and intradermal's were both negative today.  Symptoms most consistent with nonallergic rhinitis, worsened currently in the setting of recent upper respiratory infections.  Possible entopy based on her history in response to classic allergy medicines.  We will maximize her current regimen.  Gastroesophageal reflux disease, unspecified whether esophagitis present Partially controlled.  Can begin attributing to her coughing as well as rhinitis.  Discussed lifestyle changes, and if no significant improvement would recommend GI referral.  Mild intermittent asthma, unspecified whether complicated Her spirometry today was normal without significant bronchodilator response following 8 puffs of albuterol.  Throughout her exam she was heard to constant late clearing her throat and a persistent dry upper airway cough was noted.  Suspect this is secondary to postinflammatory response from recent upper respiratory infection plus history of reflux plus uncontrolled drainage.  We will focus on reducing this inflammation and optimizing her care today.  Follow-up in 6 weeks and if not controlled may consider adding medium dose inhaled steroid. Plan/Recommendations:   Patient Instructions  Chronic Idiopathic Urticaria: Therapy Plan:  - increase Xyzal (levocetirizine) to 5 mg (1 tablet) twice daily - if hives remain uncontrolled, increase dose of Xyzal (levocetirizine) to max dose of 10mg  (2 pills) twice daily- this is maximum dose - can increase or decrease dosing depending on symptom  control to a maximum dose of 4 tablets of antihistamine daily. Wait until hives free for at least one month prior to decreasing dose.   - if hives are still uncontrolled with the  above regimen, please arrange an appointment for discussion of Xolair (omalizumab)- an injectable medication for hives  Can use one of the following in place of Xyzal if desires: Claritin (loratadine) 10 mg, Zyrtec (cetirizine) 5 mg or Allegra (fexofenadine) 180 mg daily as needed  Non-allergic Rhinitis: - allergy testing today was negative to environmental allergens - Continue Flonase (fluticasone) 2 sprays in each nostril daily.  You will only get the full benefit of this medication by using it every day. - Start Atrovent (Ipratropium Bromide) 1-2 sprays in each nostril up to 3 times a day as needed for runny nose/post nasal drip.   - Start Pataday (Olopatadine) 1 drop in each eye daily as needed for itchy/watery eyes. - Continue over the counter antihistamine.  Your options include Zyrtec (Cetirizine) , Claritin (Loratadine) , Allegra (Fexofenadine) , or Xyzal (Levocetirinze)  daily.  Intermittent Asthma with Chronic Cough-suspect an upper airway cough component: - your lung testing looks good today, no significant response to bronchodilator - the Atrovent and Flonase nasal spray will help some with upper airway component and this will take time to heal following recent infections/inflammation - try to avoid throat clearing with tips below (this will help reduce inflammation in your upper airway) - Use Albuterol (Proair/Ventolin) 2 puffs every 4-6 hours as needed for chest tightness, wheezing, or coughing - Use Albuterol (Proair/Ventolin) 2 puffs 15 minutes prior to exercise if you have symptoms with activity - Use a spacer with all inhalers - please keep track of how often you are needing rescue inhaler Albuterol (Proair/Ventolin) as this will help guide future management - Asthma is not controlled if:  - Symptoms are occurring >2 times a week OR  - >2 times a month nighttime awakenings  - Please call the clinic to schedule a follow up if these symptoms arise  Reflux: -  continue reflux control with pantoprazole 40 mg as prescribed, if still having symptoms, consider discussing GI referral - lifestyle modifications as below  Return in about 6 weeks (around 08/31/2021). If no improvement In rash and on high-dose AH, consider Xolair.  If no improvement in airway symptoms, consider Flovent 110 2 puffs BID or similar.  This note in its entirety was forwarded to the Provider who requested this consultation.  Thank you for your kind referral. I appreciate the opportunity to take part in Jennfer's care. Please do not hesitate to contact me with questions.  Sincerely,  Tonny Bollman, MD [facility]

## 2021-07-20 ENCOUNTER — Encounter: Payer: Self-pay | Admitting: Internal Medicine

## 2021-07-20 ENCOUNTER — Telehealth: Payer: Self-pay | Admitting: Pharmacist

## 2021-07-20 ENCOUNTER — Other Ambulatory Visit: Payer: Self-pay

## 2021-07-20 ENCOUNTER — Ambulatory Visit: Payer: BC Managed Care – PPO | Admitting: Internal Medicine

## 2021-07-20 VITALS — BP 122/82 | HR 91 | Temp 98.7°F | Resp 18 | Ht 62.75 in | Wt 215.8 lb

## 2021-07-20 DIAGNOSIS — R6889 Other general symptoms and signs: Secondary | ICD-10-CM

## 2021-07-20 DIAGNOSIS — J4520 Mild intermittent asthma, uncomplicated: Secondary | ICD-10-CM | POA: Diagnosis not present

## 2021-07-20 DIAGNOSIS — J310 Chronic rhinitis: Secondary | ICD-10-CM | POA: Diagnosis not present

## 2021-07-20 DIAGNOSIS — L501 Idiopathic urticaria: Secondary | ICD-10-CM

## 2021-07-20 DIAGNOSIS — R053 Chronic cough: Secondary | ICD-10-CM | POA: Diagnosis not present

## 2021-07-20 DIAGNOSIS — R058 Other specified cough: Secondary | ICD-10-CM | POA: Diagnosis not present

## 2021-07-20 DIAGNOSIS — K219 Gastro-esophageal reflux disease without esophagitis: Secondary | ICD-10-CM

## 2021-07-20 DIAGNOSIS — R0989 Other specified symptoms and signs involving the circulatory and respiratory systems: Secondary | ICD-10-CM

## 2021-07-20 MED ORDER — IPRATROPIUM BROMIDE 0.03 % NA SOLN
2.0000 | Freq: Three times a day (TID) | NASAL | 12 refills | Status: DC | PRN
Start: 2021-07-20 — End: 2021-09-30

## 2021-07-20 NOTE — Telephone Encounter (Signed)
Medication Samples have been provided to the patient.   Drug name: Greggory Keen       Strength: 2.5 mg        Qty: 1 box              LOT: [id number]                      Exp.Date: 12/17/2022   Dosing instructions: Inject 2.5 mg weekly for 4 weeks   The patient has been instructed regarding the correct time, dose, and frequency of taking this medication, including desired effects and most common side effects.    Lourena Simmonds 9:50 AM 07/20/2021

## 2021-07-20 NOTE — Patient Instructions (Signed)
Chronic Idiopathic Urticaria: - this is defined as hives lasting more than 6 weeks without an identifiable trigger - hives can be from a number of different sources including infections, allergies, vibration, temperature, pressure among many others other possible causes - often an identifiable cause is not determined - some potential triggers include: stress, illness, NSAIDs, aspirin, hormonal changes - you do not have any red flag symptoms to make [place] concerned about secondary causes of hives (your recent blood labs looking at blood counts, electrolytes and liver/kidney markers as well as thyroid screening is normal) - no avoidance of any specific foods/soaps/etc is recommended - this type of hives can last for months to years (~3-5 years on average) and is considered benign - approximately 50% of patients with chronic hives can have some associated swelling of the face/lips/eyelids (this is not a cause for alarm and does not typically progress onto systemic allergic reactions) Therapy Plan:  - increase Xyzal (levocetirizine) to 5 mg (1 tablet) twice daily - if hives remain uncontrolled, increase dose of Xyzal (levocetirizine) to max dose of 10mg  (2 pills) twice daily- this is maximum dose - can increase or decrease dosing depending on symptom control to a maximum dose of 4 tablets of antihistamine daily. Wait until hives free for at least one month prior to decreasing dose.   - if hives are still uncontrolled with the above regimen, please arrange an appointment for discussion of Xolair (omalizumab)- an injectable medication for hives  Can use one of the following in place of Xyzal if desires: Claritin (loratadine) 10 mg, Zyrtec (cetirizine) 5 mg or Allegra (fexofenadine) 180 mg daily as needed    Non-allergic Rhinitis: - allergy testing today was negative to environmental allergens - Continue Flonase (fluticasone) 2 sprays in each nostril daily.  You will only get the full benefit of this  medication by using it every day. - Start Atrovent (Ipratropium Bromide) 1-2 sprays in each nostril up to 3 times a day as needed for runny nose/post nasal drip.   - Start Pataday (Olopatadine) 1 drop in each eye daily as needed for itchy/watery eyes. This is over the counter and likely not covered by your insurance. Please go to the following website to check for coupons:  (website to sign up for pataday coupon) - Continue over the counter antihistamine.  Your options include Zyrtec (Cetirizine) 10mg , Claritin (Loratadine) 10mg , Allegra (Fexofenadine) 180mg , or Xyzal (Levocetirinze) 5mg  daily.  Intermittent Asthma with Chronic Cough-suspect an upper airway cough component: - your lung testing looks good today, no significant response to bronchodilator - the Atrovent and Flonase nasal spray will help some with upper airway component and this will take time to heal following recent infections/inflammation - try to avoid throat clearing with tips below (this will help reduce inflammation in your upper airway) - Rinse mouth out after use - Use Albuterol (Proair/Ventolin) 2 puffs every 4-6 hours as needed for chest tightness, wheezing, or coughing - Use Albuterol (Proair/Ventolin) 2 puffs 15 minutes prior to exercise if you have symptoms with activity - Use a spacer with all inhalers - please keep track of how often you are needing rescue inhaler Albuterol (Proair/Ventolin) as this will help guide future management - Asthma is not controlled if:  - Symptoms are occurring >2 times a week OR  - >2 times a month nighttime awakenings  - Please call the clinic to schedule a follow up if these symptoms arise - continue reflux control with pantoprazole 40 mg as prescribed, if  still having symptoms, consider discussing GI referral - lifestyle modifications as below  CHRONIC THROAT CLEARING-WHAT TO DO!! Causes of chronic throat clearing may include the  following (among others):  Acid reflux (lanyngopharyngeal reflux) Allergies (pollens, pet dander, dust mites, etc) Non allergic rhinitis (runny nose, post nasal drainage or congestion NOT caused by allergies-can be secondary to pollutants, cold air, changes in blood vessels to the nose as we age,etc) Environmental irritants (tobacco, smoke, air pollution) Asthma If present for a long period of time, throat clearing can become a habit. We will work with you to treat any of the medical reasons for throat clearing.  Your job is to help prevent the habit, which can cause damage (redness and swelling) to your vocal cords.  It will require a conscious effort on your behalf.  Tips for prevention of throat clearing:  Instead of clearing your throat, swallow instead.   Carrying around water (or something to drink) will help you move the mucus in the right direction.  IF you have the urge to clear your throat, drink your water. If you absolutely have to clear your throat, use a non-traumatic exercise to do so.   Pant with your mouth open saying "[place], [place], [place]" with a powerful, breathy voice. Increase water intake.  This thins secretions, making them easier to swallow. Chew baking soda gum ([employer]) which can help with swallowing, reflux, and throat clearing.  Try to chew up to three times daily.   If you experience jaw pain or headaches, decrease the amount of chewing. Suck on sugar free hard candy to help with swallowing. Have your friends and family remind you to swallow when they hear you throat clearing.  As this can be habit forming, sometimes you may not realize you are doing this.  Having someone point it out to you, will help you become more conscious of the behavior. BE PATIENT.  This will take time to resolve, and some do not see improvement until 8-12 weeks into therapy/behavior modifications.  Gastroesophageal Reflux Induced Respiratory Disease and Laryngopharyngeal Reflux  (LPR): Gastroesophageal reflux disease (GERD) is a condition where the contents of the stomach reflux or back up into the esophagus or swallowing tube.  This can result in a variety of clinical symptoms including classic symptoms and atypical symptoms.  Classic symptoms of GERD include: heartburn, chest pain, acid taste in the mouth, and difficulty in swallowing.  Atypical symptoms of GERD include laryngopharyngeal reflux (LPR) and asthma.  LPR occurs when stomach reflux comes all the way up to the throat.  Clinical symptoms include hoarseness, raspy voice, laryngitis, throat clearing, postnasal drip, mucus stuck in the throat, a sensation of a lump in the throat, sore throat, and cough.  Most patients with LPR do not have classic symptoms of GERD.  Asthma can also be triggered by GERD.  The acid stomach fluid can stimulate nerve fibers in the esophagus which can cause an increase in bronchial muscle tone and narrowing of the airways.  Acid stomach contents may also reflux into the trachea and bronchi of the lungs where it can trigger an asthma attack.  Many people with GERD triggered asthma do not have classic symptoms of GERD.  Diagnosis of LPR and GERD induced asthma is frequently made from a typical history and response to medications.  It may take several months of medications to see a good response.  Occasionally, a 24-hour esophageal pH probe study must be performed.  Treatment of GERD/LPR includes:  Modification of diet and lifestyle Stop smoking Avoid overeating and lose weight Avoid acidic and fatty foods, chocolate, onions, garlic, peppermint Elevate the head of your bed 6 to 8 inches with blocks or wedge Medications Zantac, Pepcid, Axid, Tagamet Prilosec, Prevacid, Aciphex, Protonix, Nexium Surgery

## 2021-08-14 ENCOUNTER — Encounter: Payer: Self-pay | Admitting: Internal Medicine

## 2021-08-14 ENCOUNTER — Ambulatory Visit: Payer: BC Managed Care – PPO | Admitting: Pharmacist

## 2021-08-14 DIAGNOSIS — K219 Gastro-esophageal reflux disease without esophagitis: Secondary | ICD-10-CM

## 2021-08-14 DIAGNOSIS — Z6838 Body mass index (BMI) 38.0-38.9, adult: Secondary | ICD-10-CM

## 2021-08-14 MED ORDER — TIRZEPATIDE 2.5 MG/0.5ML ~~LOC~~ SOAJ: 2.5000 mg | SUBCUTANEOUS | 2 refills | Status: DC

## 2021-08-14 NOTE — Chronic Care Management (AMB) (Signed)
Care Management   Pharmacy Note  08/14/2021 Name: Carrie [name] MRN: [id number] DOB: Sep 28, 1976  Subjective: Carrie [name] is a 44 y.o. year old female who is a primary care patient of Dale Gardena, MD. The Care Management team was consulted for assistance with care management and care coordination needs.    Engaged with patient by telephone for follow up visit in response to provider referral for pharmacy case management and/or care coordination services.   The patient was given information about Care Management services today including:  Care Management services includes personalized support from designated clinical staff supervised by the patient's primary care provider, including individualized plan of care and coordination with other care providers. 24/7 contact phone numbers for assistance for urgent and routine care needs. The patient may stop case management services at any time by phone call to the office staff.  Patient agreed to services and consent obtained.  Assessment:  Review of patient status, including review of consultants reports, laboratory and other test data, was performed as part of comprehensive evaluation and provision of chronic care management services.   SDOH (Social Determinants of Health) assessments and interventions performed:    Objective:  Lab Results  Component Value Date   CREATININE 0.68 06/18/2021   CREATININE 0.67 08/09/2020   CREATININE 0.71 03/22/2019    No results found for: HGBA1C     Component Value Date/Time   CHOL 214 (H) 06/18/2021 0820   TRIG 117.0 06/18/2021 0820   HDL 52.40 06/18/2021 0820   CHOLHDL 4 06/18/2021 0820   VLDL 23.4 06/18/2021 0820   LDLCALC 138 (H) 06/18/2021 0820    BP Readings from Last 3 Encounters:  07/20/21 122/82  06/18/21 122/76  08/20/20 126/82    Care Plan  Allergies  Allergen Reactions   Citalopram Hives, Itching and Rash    Medications Reviewed Today     Reviewed by Tonny Bollman, MD  (Physician) on 07/20/21 at 1114  Med List Status: <None>   Medication Order Taking? Sig Documenting Provider Last Dose Status Informant  acetaminophen (TYLENOL) 500 MG tablet [id number] Yes Take 1,000-1,500 mg by mouth every 6 (six) hours as needed for moderate pain or headache.  [provider] Taking Active Self  albuterol (PROAIR HFA) 108 (90 Base) MCG/ACT inhaler [id number] Yes Inhale 2 puffs into the lungs every 6 (six) hours as needed for wheezing or shortness of breath. Dale [place], MD Taking Active   amphetamine-dextroamphetamine (ADDERALL) 20 MG tablet [id number] Yes Take 20 mg by mouth 2 (two) times daily. [provider] Taking Active   CALCIUM PO [id number] Yes Take 1 tablet by mouth daily. [provider] Taking Active Self  fluticasone (FLONASE) 50 MCG/ACT nasal spray [id number] Yes PLACE 1 SPRAY INTO BOTH NOSTRILS AT BEDTIME. Dale Merriam [place], MD Taking Active   Fremanezumab-vfrm (AJOVY) 225 MG/1.5ML Ivory Broad [id number] Yes Inject 225 mg into the skin every 30 (thirty) days. [provider] Taking Active   ibuprofen (ADVIL,MOTRIN) 200 MG tablet [id number] Yes Take 600 mg by mouth every 6 (six) hours as needed for headache or moderate pain. [provider] Taking Active Self  levocetirizine (XYZAL) 5 MG tablet [id number] Yes Take 5 mg by mouth daily. [provider] Taking Active   magnesium oxide (MAG-OX) 400 (241.3 Mg) MG tablet [id number] Yes TAKE 1 TABLET EVERY DAY Dale Humbird, MD Taking Active Self  Omega-3 Fatty Acids (FISH OIL BURP-LESS PO) [id number] Yes Take 1 capsule by mouth daily.  [provider] Taking Active Self  pantoprazole (PROTONIX)  40 MG tablet [id number] Yes TAKE 1 TABLET BY MOUTH EVERY DAY Dale Cuero, MD Taking Active   propranolol (INDERAL) 20 MG tablet [id number] Yes Take 1 tablet (20 mg total) by mouth 3 (three) times daily as needed. Antonieta Iba, MD Taking Active   SUMAtriptan (IMITREX) 100 MG  tablet [id number] Yes Take 100 mg by mouth as needed. [provider] Taking Active   tirzepatide [facility]) 2.5 MG/0.5ML Pen [id number] Yes Inject 2.5 mg into the skin once a week for 4 doses. Dale Rockton, MD Taking Active   tirzepatide [facility]) 5 MG/0.5ML Pen [id number] Yes Inject 5 mg into the skin once a week. Dale Robards, MD Taking Active   Ubrogepant [facility]) 100 MG TABS [id number] Yes Take by mouth. [provider] Taking Active             Patient Active Problem List   Diagnosis Date Noted   Chronic idiopathic urticaria 07/20/2021   Upper airway cough syndrome 07/20/2021   Chronic nonallergic rhinitis 07/20/2021   Mild intermittent asthma 07/20/2021   Congestion of nasal sinus 06/25/2021   Body mass index (BMI) of 38.0 to 38.9 in adult 06/25/2021   Rash 06/18/2021   Allergies 06/18/2021   History of sensory changes 08/09/2020   Fatigue 08/09/2020   Fall 12/05/2019   Dizziness 08/01/2019   Fever 07/27/2019   Breast pain, left 03/21/2019   Chest pain 12/23/2018   PVC (premature ventricular contraction) 05/17/2018   Tachycardia 03/25/2018   Healthcare maintenance 03/07/2018   Anxiety 03/07/2018   Carpal tunnel syndrome 10/06/2017   Radial styloid tenosynovitis 10/06/2017   Calculus of gallbladder with chronic cholecystitis without obstruction    Acute recurrent maxillary sinusitis 10/21/2016   Lack of concentration 09/29/2016   Closed displaced fracture of second metatarsal bone of left foot 06/18/2016   Adaptation reaction 06/13/2015   Allergic rhinitis 06/13/2015   H/O arthritis 06/13/2015   Airway hyperreactivity 06/13/2015   Acid reflux 06/13/2015   Common migraine with intractable migraine 06/13/2015   Hypercholesteremia 06/13/2015   Headache, menstrual migraine 06/13/2015   Pregnancy 10/30/2013    Conditions to be addressed/monitored:  Obesity, migraines  Care Plan : Medication Management  Updates made by Lourena Simmonds,  RPH-CPP since 08/14/2021 12:00 AM     Problem: Obesity, Migraines      Long-Range Goal: Disease Progression Prevention   Start Date: 07/11/2021  Recent Progress: On track  Priority: High  Note:   Current Barriers:  Unable to achieve control of weight   Pharmacist Clinical Goal(s):  Over the next 90 days, patient will achieve 5-10% loss from baseline through collaboration with PharmD and provider.   Interventions: 1:1 collaboration with Dale Luray, MD regarding development and update of comprehensive plan of care as evidenced by provider attestation and co-signature Inter-disciplinary care team collaboration (see longitudinal plan of care) Comprehensive medication review performed; medication list updated in electronic medical record  Overweight/Obesity Complicated by Asthma: Unable to achieve goal weight loss through lifestyle modification alone; current treatment: Mounjaro 2.5 mg weekly Medications/Strategies previously tried: None specifically for weight, but has tried Topiramate for migraines  - but did not tolerate due to CNS sedation Metformin for fertility - reports GI tolerability concerns with immediate release metformin Baseline weight: 215 lbs;  Sent message noting increased acid reflux after 4 doses of Mounjaro, asked to maintain 2.5 mg dosing for another month. Refill send. Advised to ensure pantoprazole is being taken 30 minutes before a meal, can add H2RA at bedtime.  Migraines: Moderately well controlled; current regimen: Ajovy 225 mg Q30 days; magnesium oxide 400 mg daily, Ubrelvy 100 mg PRN, sumatriptan 300 mg PRN, acetaminophen 325 mg PRN, ibuprofen 200 mg PRN - though no use of analgesics lately; follows w/ Dr. Sherryll Burger at [facility] Recommended to continue current regimen at this time along with collaboration with neurology  Asthma/Allergies: Uncontrolled per patient report, upcoming visit with allergist; current regimen: albuterol HFA PRN, fluticasone nasal  spray PRN, levocetirizine 5 mg PRN Recommended to continue current regimen at this time  ADHD: Managed by psychiatry; current regimen: adderall 20 mg BID Recommended to continue current regimen at this time  GERD: Controlled; current regimen: pantoprazole 40 mg daily Recommended to continue current regimen at this time. Consider de-escalation to H2RA therapy moving forward.    Patient Goals/Self-Care Activities Over the next 90 days, patient will:  - take medications as prescribed target a minimum of 150 minutes of moderate intensity exercise weekly engage in dietary modifications by moderating portion sizes  Follow Up Plan: Telephone follow up appointment with care management team member scheduled for: ~ 2 weeks as previously scheduled      Medication Assistance:  None required.  Patient affirms current coverage meets needs.  Follow Up:  Patient agrees to Care Plan and Follow-up.  Plan: Telephone follow up appointment with care management team member scheduled for:  ~ 2 weeks as previously scheduled  Catie Feliz Beam, PharmD, [place], CPP Clinical Pharmacist [employer] at [facility] [phone]

## 2021-08-14 NOTE — Patient Instructions (Signed)
Visit Information   Goals Addressed               This Visit's Progress     Patient Stated     Medication Monitoring (pt-stated)        Patient Goals/Self-Care Activities Over the next 90 days, patient will:  - take medications as prescribed target a minimum of 150 minutes of moderate intensity exercise weekly engage in dietary modifications by moderating portion sizes         Patient verbalizes understanding of instructions provided today and agrees to view in MyChart.    Plan: Telephone follow up appointment with care management team member scheduled for:  ~ 2 weeks as previously scheduled  Catie Feliz Beam, PharmD, [place], CPP Clinical Pharmacist [employer] at [facility] [phone]

## 2021-08-20 ENCOUNTER — Other Ambulatory Visit: Payer: Self-pay | Admitting: Internal Medicine

## 2021-08-20 DIAGNOSIS — Z6838 Body mass index (BMI) 38.0-38.9, adult: Secondary | ICD-10-CM

## 2021-08-20 NOTE — Telephone Encounter (Signed)
Received note to fill ozempic.  Per Catie's note, - on mounjaro.  Please clarify medication taking.  If needs change in medication, please send note to Catie - to see which medication is covered by her insurance.

## 2021-08-23 ENCOUNTER — Telehealth: Payer: Self-pay | Admitting: Pharmacist

## 2021-08-23 ENCOUNTER — Ambulatory Visit: Payer: BC Managed Care – PPO | Admitting: Pharmacist

## 2021-08-23 DIAGNOSIS — Z6838 Body mass index (BMI) 38.0-38.9, adult: Secondary | ICD-10-CM

## 2021-08-23 NOTE — Telephone Encounter (Signed)
Medication Samples have been labeled and logged for the patient. She plans to come by and pick up today  Drug name: Mounjaro       Strength: 2.5 mg        Qty: 1 box  LOT: [id number]  Exp.Date: 03/22/2023  Dosing instructions: Inject 2.5 mg weekly  The patient has been instructed regarding the correct time, dose, and frequency of taking this medication, including desired effects and most common side effects.   Carrie [name] 1:38 PM 08/23/2021

## 2021-08-23 NOTE — Chronic Care Management (AMB) (Signed)
Care Management   Pharmacy Note  08/23/2021 Name: Carrie [name] MRN: [id number] DOB: Dec 01, 1975  Subjective: Carrie [name] is a 45 y.o. year old female who is a primary care patient of Dale Ghent, MD. The Care Management team was consulted for assistance with care management and care coordination needs.    Engaged with patient by telephone for follow up visit in response to provider referral for pharmacy case management and/or care coordination services.   The patient was given information about Care Management services today including:  Care Management services includes personalized support from designated clinical staff supervised by the patient's primary care provider, including individualized plan of care and coordination with other care providers. 24/7 contact phone numbers for assistance for urgent and routine care needs. The patient may stop case management services at any time by phone call to the office staff.  Patient agreed to services and consent obtained.  Assessment:  Review of patient status, including review of consultants reports, laboratory and other test data, was performed as part of comprehensive evaluation and provision of chronic care management services.   SDOH (Social Determinants of Health) assessments and interventions performed:  SDOH Interventions    Flowsheet Row Most Recent Value  SDOH Interventions   Financial Strain Interventions Intervention Not Indicated        Objective:  Lab Results  Component Value Date   CREATININE 0.68 06/18/2021   CREATININE 0.67 08/09/2020   CREATININE 0.71 03/22/2019    No results found for: HGBA1C     Component Value Date/Time   CHOL 214 (H) 06/18/2021 0820   TRIG 117.0 06/18/2021 0820   HDL 52.40 06/18/2021 0820   CHOLHDL 4 06/18/2021 0820   VLDL 23.4 06/18/2021 0820   LDLCALC 138 (H) 06/18/2021 0820      BP Readings from Last 3 Encounters:  07/20/21 122/82  06/18/21 122/76  08/20/20 126/82     Care Plan  Allergies  Allergen Reactions   Citalopram Hives, Itching and Rash    Medications Reviewed Today     Reviewed by Tonny Bollman, MD (Physician) on 07/20/21 at 1114  Med List Status: <None>   Medication Order Taking? Sig Documenting Provider Last Dose Status Informant  acetaminophen (TYLENOL) 500 MG tablet [id number] Yes Take 1,000-1,500 mg by mouth every 6 (six) hours as needed for moderate pain or headache.  [provider] Taking Active Self  albuterol (PROAIR HFA) 108 (90 Base) MCG/ACT inhaler [id number] Yes Inhale 2 puffs into the lungs every 6 (six) hours as needed for wheezing or shortness of breath. Dale Woodloch, MD Taking Active   amphetamine-dextroamphetamine (ADDERALL) 20 MG tablet [id number] Yes Take 20 mg by mouth 2 (two) times daily. [provider] Taking Active   CALCIUM PO [id number] Yes Take 1 tablet by mouth daily. [provider] Taking Active Self  fluticasone (FLONASE) 50 MCG/ACT nasal spray [id number] Yes PLACE 1 SPRAY INTO BOTH NOSTRILS AT BEDTIME. Dale Bethany, MD Taking Active   Fremanezumab-vfrm (AJOVY) 225 MG/1.5ML Ivory Broad [id number] Yes Inject 225 mg into the skin every 30 (thirty) days. [provider] Taking Active   ibuprofen (ADVIL,MOTRIN) 200 MG tablet [id number] Yes Take 600 mg by mouth every 6 (six) hours as needed for headache or moderate pain. [provider] Taking Active Self  levocetirizine (XYZAL) 5 MG tablet [id number] Yes Take 5 mg by mouth daily. [provider] Taking Active   magnesium oxide (MAG-OX) 400 (241.3 Mg) MG tablet [id number] Yes TAKE 1 TABLET EVERY DAY Dale , MD Taking  Active Self  Omega-3 Fatty Acids (FISH OIL BURP-LESS PO) [id number] Yes Take 1 capsule by mouth daily.  [provider] Taking Active Self  pantoprazole (PROTONIX) 40 MG tablet [id number] Yes TAKE 1 TABLET BY MOUTH EVERY DAY Dale Parc, MD Taking Active   propranolol (INDERAL) 20 MG tablet  [id number] Yes Take 1 tablet (20 mg total) by mouth 3 (three) times daily as needed. Antonieta Iba, MD Taking Active   SUMAtriptan (IMITREX) 100 MG tablet [id number] Yes Take 100 mg by mouth as needed. [provider] Taking Active   tirzepatide [facility]) 2.5 MG/0.5ML Pen [id number] Yes Inject 2.5 mg into the skin once a week for 4 doses. Dale Gilbert, MD Taking Active   tirzepatide [facility]) 5 MG/0.5ML Pen [id number] Yes Inject 5 mg into the skin once a week. Dale [place], MD Taking Active   Ubrogepant [facility]) 100 MG TABS [id number] Yes Take by mouth. [provider] Taking Active             Patient Active Problem List   Diagnosis Date Noted   Chronic idiopathic urticaria 07/20/2021   Upper airway cough syndrome 07/20/2021   Chronic nonallergic rhinitis 07/20/2021   Mild intermittent asthma 07/20/2021   Congestion of nasal sinus 06/25/2021   Body mass index (BMI) of 38.0 to 38.9 in adult 06/25/2021   Rash 06/18/2021   Allergies 06/18/2021   History of sensory changes 08/09/2020   Fatigue 08/09/2020   Fall 12/05/2019   Dizziness 08/01/2019   Fever 07/27/2019   Breast pain, left 03/21/2019   Chest pain 12/23/2018   PVC (premature ventricular contraction) 05/17/2018   Tachycardia 03/25/2018   Healthcare maintenance 03/07/2018   Anxiety 03/07/2018   Carpal tunnel syndrome 10/06/2017   Radial styloid tenosynovitis 10/06/2017   Calculus of gallbladder with chronic cholecystitis without obstruction    Acute recurrent maxillary sinusitis 10/21/2016   Lack of concentration 09/29/2016   Closed displaced fracture of second metatarsal bone of left foot 06/18/2016   Adaptation reaction 06/13/2015   Allergic rhinitis 06/13/2015   H/O arthritis 06/13/2015   Airway hyperreactivity 06/13/2015   Acid reflux 06/13/2015   Common migraine with intractable migraine 06/13/2015   Hypercholesteremia 06/13/2015   Headache, menstrual migraine 06/13/2015   Pregnancy  10/30/2013    Conditions to be addressed/monitored:  Obesity  Care Plan : Medication Management  Updates made by Lourena Simmonds, RPH-CPP since 08/23/2021 12:00 AM     Problem: Obesity, Migraines      Long-Range Goal: Disease Progression Prevention   Start Date: 07/11/2021  Recent Progress: On track  Priority: High  Note:   Current Barriers:  Unable to achieve control of weight   Pharmacist Clinical Goal(s):  Over the next 90 days, patient will achieve 5-10% loss from baseline through collaboration with PharmD and provider.   Interventions: 1:1 collaboration with Dale Oakvale, MD regarding development and update of comprehensive plan of care as evidenced by provider attestation and co-signature Inter-disciplinary care team collaboration (see longitudinal plan of care) Comprehensive medication review performed; medication list updated in electronic medical record  Overweight/Obesity Complicated by Asthma: Unable to achieve goal weight loss through lifestyle modification alone; current treatment: Mounjaro 2.5 mg weekly - missed her last dose due to confusion with the pharmacy. She thought they didn't have it in stock, but the pharmacist said they didn't have her savings card Medications/Strategies previously tried: None specifically for weight, but has tried: Topiramate for migraines  - but did not tolerate due to CNS sedation Metformin  for fertility - reports GI tolerability concerns with immediate release metformin Baseline weight: 215 lbs;  Reports she has had significant acid reflux that wakes her up at night. No hx GI evaluation. Currently on pantoprazole 40 mg daily, advised previously to add famotidine at night. Discussed with PCP. Patient amenable to GI referral for GERD.  Patient elects to continue Mounjaro 2.5 mg weekly. Will provide sample as [facility] does not have the medication in stock.   Migraines: Moderately well controlled; current regimen: Ajovy 225 mg Q30 days;  magnesium oxide 400 mg daily, Ubrelvy 100 mg PRN, sumatriptan 300 mg PRN, acetaminophen 325 mg PRN, ibuprofen 200 mg PRN - though no use of analgesics lately; follows w/ [facility]. Sherryll Burger at [facility] Previously recommended to continue current regimen at this time along with collaboration with neurology  Asthma/Allergies: Uncontrolled per patient report, upcoming visit with allergist; current regimen: albuterol HFA PRN, fluticasone nasal spray PRN, levocetirizine 5 mg PRN Previously recommended to continue current regimen at this time  ADHD: Managed by psychiatry; current regimen: adderall 20 mg BID Previously recommended to continue current regimen at this time  GERD: Controlled; current regimen: pantoprazole 40 mg daily, added famotidine at bedtime as above.  GI referral as above.    Patient Goals/Self-Care Activities Over the next 90 days, patient will:  - take medications as prescribed target a minimum of 150 minutes of moderate intensity exercise weekly engage in dietary modifications by moderating portion sizes  Follow Up Plan: Telephone follow up appointment with care management team member scheduled for: ~ 8 weeks      Follow Up:  Patient agrees to Care Plan and Follow-up.  Plan: Telephone follow up appointment with care management team member scheduled for:  8 weeks  Catie Feliz Beam, PharmD, [place], CPP Clinical Pharmacist [employer] [facility] [employer] [phone]

## 2021-08-23 NOTE — Patient Instructions (Signed)
Visit Information   Goals Addressed               This Visit's Progress     Patient Stated     Medication Monitoring (pt-stated)        Patient Goals/Self-Care Activities Over the next 90 days, patient will:  - take medications as prescribed target a minimum of 150 minutes of moderate intensity exercise weekly engage in dietary modifications by moderating portion sizes        Patient verbalizes understanding of instructions provided today and agrees to view in MyChart.   Plan: Telephone follow up appointment with care management team member scheduled for:  8 weeks  Catie Feliz Beam, PharmD, [place], CPP Clinical Pharmacist [employer] [facility] [employer] [phone]

## 2021-08-24 ENCOUNTER — Other Ambulatory Visit: Payer: Self-pay | Admitting: Internal Medicine

## 2021-08-24 DIAGNOSIS — K219 Gastro-esophageal reflux disease without esophagitis: Secondary | ICD-10-CM

## 2021-08-24 NOTE — Progress Notes (Signed)
Order placed for GI referral.   

## 2021-09-06 ENCOUNTER — Ambulatory Visit: Payer: BC Managed Care – PPO | Admitting: Internal Medicine

## 2021-09-06 ENCOUNTER — Other Ambulatory Visit: Payer: Self-pay

## 2021-09-06 ENCOUNTER — Encounter: Payer: Self-pay | Admitting: Internal Medicine

## 2021-09-06 VITALS — BP 110/68 | HR 80 | Temp 98.3°F | Resp 18

## 2021-09-06 DIAGNOSIS — J310 Chronic rhinitis: Secondary | ICD-10-CM

## 2021-09-06 DIAGNOSIS — L501 Idiopathic urticaria: Secondary | ICD-10-CM

## 2021-09-06 NOTE — Progress Notes (Signed)
FOLLOW UP Date of Service/Encounter:  09/06/21   Subjective:  Carrie [name] (DOB: 22-Feb-1976) is a 45 y.o. female who [facility] in re-evaluation of the following: Chronic idiopathic urticaria, chronic nonallergic rhinitis, recent History obtained from: chart review and patient.  For Review, LV was on 07/20/21  with Dr.Carsin Randazzo seen for chronic idiopathic urticaria (plan to start xyzal BID) and chronic nonallergic rhinitis (flonase, atrovent, antihistamine, pataday), and reflux (pantoprazole 40 mg).    Previous diagnostics:  Pre/post spiro (initial visit): FEV1: 3.14L, 97% predicted,  3.10 L, 95% (post)  FEV1/FVC ratio: 103 %,  101% (post) - no obstruction, no bronchodilator response SPT negative and IDs negative.  Today presents for follow-up. Last time we saw her, her hives have been controlled while on the Xyzal. She is taking 3 per day (2 in AM and one at night). However, her rhinitis remains uncontrolled.  When she started the atrovent nasal spray, she felt that her rhinorrhea and congestion worsened.  She has been using Flonase for awhile, but hasn't seen a significant improvement with this medication. She stopped the Atrovent about a week ago and this helped her feel like she isn't stopped up, but she continues to have runny and itchy nose. She has used Mucinex.   She is having increased facial pressure.  No fevers Last antibiotics were in July. Gets antibiotics twice per year usually for sinusitis, but had not had any infections until this July during COVID pandemic.  She does not feel she has a sinus infection at this time. She reports that her asthma has been well controlled since last visit. Allergies as of 09/06/2021       Reactions   Citalopram Hives, Itching, Rash        Medication List        Accurate as of September 06, 2021  6:15 PM. If you have any questions, ask your nurse or doctor.          acetaminophen 500 MG  tablet Commonly known as: TYLENOL Take 1,000-1,500 mg by mouth every 6 (six) hours as needed for moderate pain or headache.   Ajovy 225 MG/1.5ML Soaj Generic drug: Fremanezumab-vfrm Inject 225 mg into the skin every 30 (thirty) days.   albuterol 108 (90 Base) MCG/ACT inhaler Commonly known as: ProAir HFA Inhale 2 puffs into the lungs every 6 (six) hours as needed for wheezing or shortness of breath.   amphetamine-dextroamphetamine 20 MG tablet Commonly known as: ADDERALL Take 20 mg by mouth 2 (two) times daily.   CALCIUM PO Take 1 tablet by mouth daily.   FISH OIL BURP-LESS PO Take 1 capsule by mouth daily.   fluticasone 50 MCG/ACT nasal spray Commonly known as: FLONASE PLACE 1 SPRAY INTO BOTH NOSTRILS AT BEDTIME.   ibuprofen 200 MG tablet Commonly known as: ADVIL Take 600 mg by mouth every 6 (six) hours as needed for headache or moderate pain.   ipratropium 0.03 % nasal spray Commonly known as: ATROVENT Place 2 sprays into both nostrils 3 (three) times daily as needed for rhinitis.   levocetirizine 5 MG tablet Commonly known as: XYZAL Take 5 mg by mouth daily.   magnesium oxide 400 (241.3 Mg) MG tablet Commonly known as: MAG-OX TAKE 1 TABLET EVERY DAY   pantoprazole 40 MG tablet Commonly known as: PROTONIX TAKE 1 TABLET BY MOUTH EVERY DAY   propranolol 20 MG tablet Commonly known as: INDERAL Take 1 tablet (20 mg total) by mouth 3 (  three) times daily as needed.   SUMAtriptan 100 MG tablet Commonly known as: IMITREX Take 100 mg by mouth as needed.   tirzepatide 2.5 MG/0.5ML Pen Commonly known as: MOUNJARO Inject 2.5 mg into the skin once a week.   Ubrelvy 100 MG Tabs Generic drug: Ubrogepant Take by mouth.       Past Medical History:  Diagnosis Date   Abnormal Pap smear    Allergy    Arthritis    t-12   Asthma    exercised induced   Depression    GERD (gastroesophageal reflux disease)    Headache(784.0)    Mononucleosis 1998   PONV  (postoperative nausea and vomiting)    takes awhile to wake up   Past Surgical History:  Procedure Laterality Date   CARPAL TUNNEL RELEASE Bilateral 2016   CESAREAN SECTION N/A 10/30/2013   Procedure: CESAREAN SECTION;  Surgeon: Meriel Pica, MD;  Location: [facility];  Service: Obstetrics;  Laterality: N/A;  PRIMARY   CHOLECYSTECTOMY N/A 10/24/2017   Procedure: LAPAROSCOPIC CHOLECYSTECTOMY;  Surgeon: Lattie Haw, MD;  Location: [facility] ORS;  Service: General;  Laterality: N/A;   COLPOSCOPY W/ BIOPSY / CURETTAGE     DE QUERVAIN'S RELEASE Left 2016   DILATION AND CURETTAGE OF UTERUS     FOOT FRACTURE SURGERY Left    HAND SURGERY Left    Fracture repair   IVF     Otherwise, there have been no changes to her past medical history, surgical history, family history, or social history.  ROS: All others negative except as noted per HPI.   Objective:  BP 110/68 (BP Location: Right Arm, Patient Position: Sitting, Cuff Size: Large)   Pulse 80   Temp 98.3 F (36.8 C) (Temporal)   Resp 18   SpO2 100%  There is no height or weight on file to calculate BMI. Physical Exam: General Appearance:  Alert, cooperative, no distress, appears stated age  Head:  Normocephalic, without obvious abnormality, atraumatic  Eyes:  Conjunctiva clear, EOM's intact  Nose: Nares normal, normal mucosa, slightly deviated left septal deviation  Throat: Lips, tongue normal; teeth and gums normal, normal posterior oropharynx  Neck: Supple, symmetrical  Lungs:   Clear to auscultation bilaterally, respirations unlabored, no coughing  Heart:  Regular rate and rhythm, no murmur appears well perfused  Extremities: No edema  Skin: Skin color, texture, turgor normal, no rashes or lesions on visualized portions of skin  Neurologic: No gross deficits   Assessment/Plan   Patient Instructions  Chronic Idiopathic Urticaria: -continue Xyzal (levocetirizine) at 10 mg (2 tablets in AM) and 5 mg (1 tablet) in PM - if  hives remain uncontrolled, increase dose of Xyzal (levocetirizine) to max dose of 10mg  (2 pills) twice daily- this is maximum dose - can increase or decrease dosing depending on symptom control to a maximum dose of 4 tablets of antihistamine daily. Wait until hives free for at least one month prior to decreasing dose.   - if hives are still uncontrolled with the above regimen, please arrange an appointment for discussion of Xolair (omalizumab)- an injectable medication for hives Can use one of the following in place of Xyzal if desires: Claritin (loratadine) 10 mg, Zyrtec (cetirizine) 5 mg or Allegra (fexofenadine) 180 mg daily as needed  Non-allergic Rhinitis: - allergy testing at last visit negative to environmental allergens - Continue Flonase (fluticasone) 2 sprays in each nostril daily.  You will only get the full benefit of this medication by using it  every day. - Stop Atrovent (Ipratropium Bromide)  - Start Mucinex 600 mg nightly.  Can increase to twice daily if needed.  Drink lots of water while taking this medication. - Continue Pataday (Olopatadine) 1 drop in each eye daily as needed for itchy/watery eyes. This is over the counter and likely not covered by your insurance. Please go to the following website to check for coupons: [email or web address].au  (website to sign up for pataday coupon) - Continue Xyzal as above. - will refer to ENT for further evaluation  - call if no improvement or worsening of symptoms in the next few days to week for consideration of antibiotics.  Intermittent Asthma - Use Albuterol (Proair/Ventolin) 2 puffs every 4-6 hours as needed for chest tightness, wheezing, or coughing - Use Albuterol (Proair/Ventolin) 2 puffs 15 minutes prior to exercise if you have symptoms with activity - Use a spacer with all inhalers - please keep track of how often you are needing rescue inhaler Albuterol (Proair/Ventolin) as this will help guide future  management - Asthma is not controlled if:  - Symptoms are occurring >2 times a week OR  - >2 times a month nighttime awakenings  - Please call the clinic to schedule a follow up if these symptoms arise  Reflux:  - continue reflux control with pantoprazole 40 mg as prescribed, if still having symptoms, consider discussing GI referral  Follow-up in 3 months   Tonny Bollman, MD  [facility]

## 2021-09-06 NOTE — Patient Instructions (Addendum)
Chronic Idiopathic Urticaria: -continue Xyzal (levocetirizine) at 10 mg (2 tablets in AM) and 5 mg (1 tablet) in PM - if hives remain uncontrolled, increase dose of Xyzal (levocetirizine) to max dose of 10mg  (2 pills) twice daily- this is maximum dose - can increase or decrease dosing depending on symptom control to a maximum dose of 4 tablets of antihistamine daily. Wait until hives free for at least one month prior to decreasing dose.   - if hives are still uncontrolled with the above regimen, please arrange an appointment for discussion of Xolair (omalizumab)- an injectable medication for hives Can use one of the following in place of Xyzal if desires: Claritin (loratadine) 10 mg, Zyrtec (cetirizine) 5 mg or Allegra (fexofenadine) 180 mg daily as needed  Non-allergic Rhinitis: - allergy testing at last visit negative to environmental allergens - Continue Flonase (fluticasone) 2 sprays in each nostril daily.  You will only get the full benefit of this medication by using it every day. - Stop Atrovent (Ipratropium Bromide)  - Start Mucinex 600 mg nightly.  Can increase to twice daily if needed.  Drink lots of water while taking this medication. - Continue Pataday (Olopatadine) 1 drop in each eye daily as needed for itchy/watery eyes. This is over the counter and likely not covered by your insurance. Please go to the following website to check for coupons:  (website to sign up for pataday coupon) - Continue Xyzal as above. - will refer to ENT for further evaluation  - call if no improvement or worsening of symptoms in the next few weeks for consideration of antibiotics.  Intermittent Asthma - Use Albuterol (Proair/Ventolin) 2 puffs every 4-6 hours as needed for chest tightness, wheezing, or coughing - Use Albuterol (Proair/Ventolin) 2 puffs 15 minutes prior to exercise if you have symptoms with activity - Use a spacer with all inhalers - please  keep track of how often you are needing rescue inhaler Albuterol (Proair/Ventolin) as this will help guide future management - Asthma is not controlled if:  - Symptoms are occurring >2 times a week OR  - >2 times a month nighttime awakenings  - Please call the clinic to schedule a follow up if these symptoms arise  Reflux:  - continue reflux control with pantoprazole 40 mg as prescribed, if still having symptoms, consider discussing GI referral

## 2021-09-17 ENCOUNTER — Telehealth: Payer: Self-pay

## 2021-09-17 NOTE — Telephone Encounter (Signed)
-----   Message from Tonny Bollman, MD sent at 09/06/2021 12:17 PM EDT ----- This patient needs a referral to ENT (nonallergic rhinitis, refractory to medical treatment). She would like to go to [facility].

## 2021-09-21 ENCOUNTER — Ambulatory Visit: Payer: BC Managed Care – PPO | Admitting: Internal Medicine

## 2021-09-21 ENCOUNTER — Other Ambulatory Visit: Payer: Self-pay

## 2021-09-21 ENCOUNTER — Encounter: Payer: Self-pay | Admitting: Internal Medicine

## 2021-09-21 DIAGNOSIS — G43829 Menstrual migraine, not intractable, without status migrainosus: Secondary | ICD-10-CM

## 2021-09-21 DIAGNOSIS — R0981 Nasal congestion: Secondary | ICD-10-CM

## 2021-09-21 DIAGNOSIS — E7800 Pure hypercholesterolemia, unspecified: Secondary | ICD-10-CM

## 2021-09-21 DIAGNOSIS — K219 Gastro-esophageal reflux disease without esophagitis: Secondary | ICD-10-CM

## 2021-09-21 DIAGNOSIS — G43019 Migraine without aura, intractable, without status migrainosus: Secondary | ICD-10-CM

## 2021-09-21 DIAGNOSIS — R41840 Attention and concentration deficit: Secondary | ICD-10-CM

## 2021-09-21 DIAGNOSIS — R21 Rash and other nonspecific skin eruption: Secondary | ICD-10-CM

## 2021-09-21 DIAGNOSIS — F419 Anxiety disorder, unspecified: Secondary | ICD-10-CM

## 2021-09-21 NOTE — Progress Notes (Signed)
Patient ID: Carrie [name], female   DOB: August 09, 1976, 45 y.o.   MRN: [id number]   Subjective:    Patient ID: Carrie [name], female    DOB: [birth year], 45 y.o.   MRN: [id number]  This visit occurred during the SARS-CoV-2 public health emergency.  Safety protocols were in place, including screening questions prior to the visit, additional usage of staff PPE, and extensive cleaning of exam room while observing appropriate contact time as indicated for disinfecting solutions.   Patient here for a scheduled follow up.   Chief Complaint  Patient presents with   Follow-up   Hyperlipidemia   .   HPI Recently evaluated by allergist.  Allergy testing essentially unrevealing - however she felt itching with injection.  Used cortisone.  Has been taking xyzal.  No increased rash now.  Persistent "congestion" issues.  Discussed antihistamine and nasacort.  No chest pain.  Breathing stable.  Discussed acid reflux.  Discussed possible return to ENT.  No increased abdominal pain reported.  Increased stress.  Seeing Dr Maryruth Bun.     Past Medical History:  Diagnosis Date   Abnormal Pap smear    Allergy    Arthritis    t-12   Asthma    exercised induced   Depression    GERD (gastroesophageal reflux disease)    Headache(784.0)    Mononucleosis 1998   PONV (postoperative nausea and vomiting)    takes awhile to wake up   Past Surgical History:  Procedure Laterality Date   CARPAL TUNNEL RELEASE Bilateral 2016   CESAREAN SECTION N/A 10/30/2013   Procedure: CESAREAN SECTION;  Surgeon: Meriel Pica, MD;  Location: [facility];  Service: Obstetrics;  Laterality: N/A;  PRIMARY   CHOLECYSTECTOMY N/A 10/24/2017   Procedure: LAPAROSCOPIC CHOLECYSTECTOMY;  Surgeon: Lattie Haw, MD;  Location: [facility];  Service: General;  Laterality: N/A;   COLPOSCOPY W/ BIOPSY / CURETTAGE     DE QUERVAIN'S RELEASE Left 2016   DILATION AND CURETTAGE OF UTERUS     FOOT FRACTURE SURGERY Left    HAND SURGERY Left    Fracture  repair   IVF     Family History  Problem Relation Age of Onset   Asthma Mother    Arthritis Mother    Diverticulitis Mother    Thyroid disease Mother    GER disease Mother    Migraines Mother    Arthritis Father    Hypertension Father    Hyperlipidemia Father    Thyroid disease Father    Allergic rhinitis Sister    Anemia Sister    Thyroid disease Sister    Thyroid disease Sister    Bipolar disorder Sister    Arrhythmia Sister        Ventricular Tachycardia   Healthy Brother    Diabetes Maternal Grandmother    Heart attack Maternal Grandmother    Alzheimer's disease Maternal Grandmother    Emphysema Maternal Grandfather    Breast cancer Paternal Grandmother    Heart disease Paternal Grandfather    Heart attack Paternal Grandfather    Social History   Socioeconomic History   Marital status: Married    Spouse name: Not on file   Number of children: 1   Years of education: Master's   Highest education level: Not on file  Occupational History   Occupation: Part-time Runner, broadcasting/film/video    Comment: Facilities manager  Tobacco Use   Smoking status: Never   Smokeless tobacco: Never  Vaping Use   Vaping Use: Never used  Substance and Sexual Activity   Alcohol use: No   Drug use: No   Sexual activity: Not Currently    Comment: couple is afraid  to mess something up  Other Topics Concern   Not on file  Social History Narrative   Not on file   Social Determinants of Health   Financial Resource Strain: Low Risk    Difficulty of Paying Living Expenses: Not hard at all  Food Insecurity: Not on file  Transportation Needs: Not on file  Physical Activity: Not on file  Stress: Not on file  Social Connections: Not on file     Review of Systems  Constitutional:  Negative for appetite change and unexpected weight change.  HENT:  Positive for congestion and postnasal drip.   Respiratory:  Negative for cough, chest tightness and shortness of breath.   Cardiovascular:  Negative  for chest pain, palpitations and leg swelling.  Gastrointestinal:  Negative for abdominal pain, diarrhea, nausea and vomiting.  Genitourinary:  Negative for difficulty urinating and dysuria.  Musculoskeletal:  Negative for joint swelling and myalgias.  Skin:  Negative for color change and rash.  Neurological:  Negative for dizziness, light-headedness and headaches.  Psychiatric/Behavioral:  Negative for agitation and dysphoric mood.       Objective:     BP 118/82   Pulse 90   Temp (!) 96.9 F (36.1 C)   Ht 5' 2.76" (1.594 m)   Wt 206 lb 6.4 oz (93.6 kg)   SpO2 99%   BMI 36.85 kg/m  Wt Readings from Last 3 Encounters:  09/21/21 206 lb 6.4 oz (93.6 kg)  07/20/21 215 lb 12.8 oz (97.9 kg)  06/18/21 215 lb 6.4 oz (97.7 kg)    Physical Exam Vitals reviewed.  Constitutional:      General: She is not in acute distress.    Appearance: Normal appearance.  HENT:     Head: Normocephalic and atraumatic.     Right Ear: External ear normal.     Left Ear: External ear normal.  Eyes:     General: No scleral icterus.       Right eye: No discharge.        Left eye: No discharge.     Conjunctiva/sclera: Conjunctivae normal.  Neck:     Thyroid: No thyromegaly.  Cardiovascular:     Rate and Rhythm: Normal rate and regular rhythm.  Pulmonary:     Effort: No respiratory distress.     Breath sounds: Normal breath sounds. No wheezing.  Abdominal:     General: Bowel sounds are normal.     Palpations: Abdomen is soft.     Tenderness: There is no abdominal tenderness.  Musculoskeletal:        General: No swelling or tenderness.     Cervical back: Neck supple. No tenderness.  Lymphadenopathy:     Cervical: No cervical adenopathy.  Skin:    Findings: No erythema or rash.  Neurological:     Mental Status: She is alert.  Psychiatric:        Mood and Affect: Mood normal.        Behavior: Behavior normal.     Outpatient Encounter Medications as of 09/21/2021  Medication Sig    acetaminophen (TYLENOL) 500 MG tablet Take 1,000-1,500 mg by mouth every 6 (six) hours as needed for moderate pain or headache.    albuterol (PROAIR HFA) 108 (90 Base) MCG/ACT inhaler Inhale 2 puffs into the lungs every 6 (six) hours as needed for  wheezing or shortness of breath.   amphetamine-dextroamphetamine (ADDERALL) 20 MG tablet Take 20 mg by mouth 2 (two) times daily.   CALCIUM PO Take 1 tablet by mouth daily.   fluticasone (FLONASE) 50 MCG/ACT nasal spray PLACE 1 SPRAY INTO BOTH NOSTRILS AT BEDTIME.   Fremanezumab-vfrm (AJOVY) 225 MG/1.5ML SOAJ Inject 225 mg into the skin every 30 (thirty) days.   ibuprofen (ADVIL,MOTRIN) 200 MG tablet Take 600 mg by mouth every 6 (six) hours as needed for headache or moderate pain.   levocetirizine (XYZAL) 5 MG tablet Take 5 mg by mouth daily.   magnesium oxide (MAG-OX) 400 (241.3 Mg) MG tablet TAKE 1 TABLET EVERY DAY   Omega-3 Fatty Acids (FISH OIL BURP-LESS PO) Take 1 capsule by mouth daily.    pantoprazole (PROTONIX) 40 MG tablet TAKE 1 TABLET BY MOUTH EVERY DAY   propranolol (INDERAL) 20 MG tablet Take 1 tablet (20 mg total) by mouth 3 (three) times daily as needed.   SUMAtriptan (IMITREX) 100 MG tablet Take 100 mg by mouth as needed.   tirzepatide [facility]) 2.5 MG/0.5ML Pen Inject 2.5 mg into the skin once a week.   Ubrogepant (UBRELVY) 100 MG TABS Take by mouth.   [DISCONTINUED] ipratropium (ATROVENT) 0.03 % nasal spray Place 2 sprays into both nostrils 3 (three) times daily as needed for rhinitis. (Patient not taking: Reported on 09/21/2021)   No facility-administered encounter medications on file as of 09/21/2021.     Lab Results  Component Value Date   WBC 6.4 06/18/2021   HGB 12.1 06/18/2021   HCT 36.8 06/18/2021   PLT 339.0 06/18/2021   GLUCOSE 84 06/18/2021   CHOL 214 (H) 06/18/2021   TRIG 117.0 06/18/2021   HDL 52.40 06/18/2021   LDLCALC 138 (H) 06/18/2021   ALT 13 06/18/2021   AST 13 06/18/2021   NA 137 06/18/2021   K 4.2  06/18/2021   CL 104 06/18/2021   CREATININE 0.68 06/18/2021   BUN 19 06/18/2021   CO2 22 06/18/2021   TSH 1.87 06/18/2021    MR Brain W Wo Contrast  Result Date: 08/25/2019 CLINICAL DATA:  Headaches with dizziness and syncope for 2 months. EXAM: MRI HEAD WITHOUT AND WITH CONTRAST TECHNIQUE: Multiplanar, multiecho pulse sequences of the brain and surrounding structures were obtained without and with intravenous contrast. CONTRAST:  7.72mL GADAVIST GADOBUTROL 1 MMOL/ML IV SOLN COMPARISON:  None. FINDINGS: BRAIN: There is no acute infarct, acute hemorrhage or extra-axial collection. The white matter signal is normal for the patient's age. The cerebral and cerebellar volume are age-appropriate. There is no hydrocephalus. The midline structures are normal. There is no abnormal contrast enhancement. VASCULAR: The major intracranial arterial and venous sinus flow voids are normal. Susceptibility-sensitive sequences show no chronic microhemorrhage or superficial siderosis. SKULL AND UPPER CERVICAL SPINE: Calvarial bone marrow signal is normal. There is no skull base mass. The visualized upper cervical spine and soft tissues are normal. SINUSES/ORBITS: There are no fluid levels or advanced mucosal thickening. The mastoid air cells and middle ear cavities are free of fluid. The orbits are normal. IMPRESSION: Normal brain MRI. Electronically Signed   By: Deatra Robinson M.D.   On: 08/25/2019 17:39       Assessment & Plan:   Problem List Items Addressed This Visit     Acid reflux    Treat acid reflux.  Protonix.  Follow.        Anxiety    Increased stress.  Seeing Dr Maryruth Bun.  No longer on cymbalta.  Follow.       Common migraine with intractable migraine    S/p SPG block.  Followed by neurology.  Continue ajovy.  Appears to be doing better.  Follow.       Congestion of nasal sinus    Saw allergist.  Note reviewed.  Has been on xyzal.  Discussed antihistamine, nasacort.  Treat acid reflux.  Discussed  possible f/u with ENT       Headache, menstrual migraine    Has seen neurology.  Doing well on current regimen as outlined.  Follow.        Hypercholesteremia    Low cholesterol diet and exercise.  Follow lipid panel.       Lack of concentration    Seeing Dr Maryruth Bun. Prescribed adderall.  Follow.       Rash    Saw allergist as outlined.  Currently doing well.  On antihistamine.  Follow.         Dale [place], MD

## 2021-09-30 ENCOUNTER — Encounter: Payer: Self-pay | Admitting: Internal Medicine

## 2021-09-30 NOTE — Assessment & Plan Note (Addendum)
S/p SPG block.  Followed by neurology.  Continue ajovy.  Appears to be doing better.  Follow.  

## 2021-09-30 NOTE — Assessment & Plan Note (Signed)
Saw allergist as outlined.  Currently doing well.  On antihistamine.  Follow.

## 2021-09-30 NOTE — Assessment & Plan Note (Signed)
Low cholesterol diet and exercise.  Follow lipid panel.   

## 2021-09-30 NOTE — Assessment & Plan Note (Signed)
Treat acid reflux.  Protonix.  Follow.

## 2021-09-30 NOTE — Assessment & Plan Note (Signed)
Seeing Dr Maryruth Bun. Prescribed adderall.  Follow.

## 2021-09-30 NOTE — Assessment & Plan Note (Signed)
Saw allergist.  Note reviewed.  Has been on xyzal.  Discussed antihistamine, nasacort.  Treat acid reflux.  Discussed possible f/u with ENT

## 2021-09-30 NOTE — Assessment & Plan Note (Signed)
Has seen neurology.  Doing well on current regimen as outlined.  Follow.

## 2021-09-30 NOTE — Assessment & Plan Note (Signed)
Increased stress.  Seeing Dr Maryruth Bun.  No longer on cymbalta.  Follow.

## 2021-10-01 ENCOUNTER — Encounter: Payer: Self-pay | Admitting: Gastroenterology

## 2021-10-01 ENCOUNTER — Ambulatory Visit (INDEPENDENT_AMBULATORY_CARE_PROVIDER_SITE_OTHER): Payer: BC Managed Care – PPO | Admitting: Gastroenterology

## 2021-10-01 ENCOUNTER — Other Ambulatory Visit: Payer: Self-pay

## 2021-10-01 VITALS — BP 125/82 | HR 88 | Temp 98.2°F | Ht 63.0 in | Wt 205.0 lb

## 2021-10-01 DIAGNOSIS — K219 Gastro-esophageal reflux disease without esophagitis: Secondary | ICD-10-CM

## 2021-10-01 NOTE — Progress Notes (Signed)
Carrie [name] [address]  [address]  [place], [place] [address]  Main: [phone]  Fax: [phone]   Gastroenterology Consultation  Referring Provider:     Dale Bancroft, MD Primary Care Physician:  Dale Manvel, MD Reason for Consultation:     Reflux        HPI:    Chief Complaint  Patient presents with   New Patient (Initial Visit)   Gastroesophageal Reflux    Carrie [name] is a 45 y.o. y/o female referred for consultation & management  by Dr. Lorin Picket, Westley Hummer, MD. patient reports chronic history of reflux and has been on PPI for years.  Due to ongoing symptoms Pepcid at that time was recently added by PCP.  Patient still notes burning sensation in in her throat despite this.  No dysphagia.  No nausea or vomiting.  Describes having an EGD in the 1990s but does not recall if she was having similar symptoms at that time.  No weight loss.  No altered bowel habits.  No family history of GI malignancy.  PCP notes reviewed notes history of acid reflux and Protonix was prescribed.  Also notes anxiety and recent increased stress. Recent labs reviewed as per below and otherwise reassuring  Past Medical History:  Diagnosis Date   Abnormal Pap smear    Allergy    Arthritis    t-12   Asthma    exercised induced   Depression    GERD (gastroesophageal reflux disease)    Headache(784.0)    Mononucleosis 1998   PONV (postoperative nausea and vomiting)    takes awhile to wake up    Past Surgical History:  Procedure Laterality Date   CARPAL TUNNEL RELEASE Bilateral 2016   CESAREAN SECTION N/A 10/30/2013   Procedure: CESAREAN SECTION;  Surgeon: Meriel Pica, MD;  Location: [facility];  Service: Obstetrics;  Laterality: N/A;  PRIMARY   CHOLECYSTECTOMY N/A 10/24/2017   Procedure: LAPAROSCOPIC CHOLECYSTECTOMY;  Surgeon: Lattie Haw, MD;  Location: [facility];  Service: General;  Laterality: N/A;   COLPOSCOPY W/ BIOPSY / CURETTAGE     DE QUERVAIN'S RELEASE Left  2016   DILATION AND CURETTAGE OF UTERUS     FOOT FRACTURE SURGERY Left    HAND SURGERY Left    Fracture repair   IVF      Prior to Admission medications   Medication Sig Start Date End Date Taking? Authorizing Provider  acetaminophen (TYLENOL) 500 MG tablet Take 1,000-1,500 mg by mouth every 6 (six) hours as needed for moderate pain or headache.    Yes [provider]  albuterol (PROAIR HFA) 108 (90 Base) MCG/ACT inhaler Inhale 2 puffs into the lungs every 6 (six) hours as needed for wheezing or shortness of breath. 09/04/18  Yes Dale [place], MD  amphetamine-dextroamphetamine (ADDERALL) 20 MG tablet Take 20 mg by mouth 2 (two) times daily. 05/21/21  Yes [provider]  CALCIUM PO Take 1 tablet by mouth daily.   Yes [provider]  famotidine (PEPCID) 10 MG tablet Take 10 mg by mouth 2 (two) times daily.   Yes [provider]  fluticasone (FLONASE) 50 MCG/ACT nasal spray PLACE 1 SPRAY INTO BOTH NOSTRILS AT BEDTIME. 12/13/19  Yes [name], Westley Hummer, MD  Fremanezumab-vfrm (AJOVY) 225 MG/1.5ML SOAJ Inject 225 mg into the skin every 30 (thirty) days. 06/27/21  Yes [provider]  ibuprofen (ADVIL,MOTRIN) 200 MG tablet Take 600 mg by mouth every 6 (six) hours as needed for headache or moderate  pain.   Yes [provider]  levocetirizine (XYZAL) 5 MG tablet Take 5 mg by mouth daily.   Yes [provider]  magnesium oxide (MAG-OX) 400 (241.3 Mg) MG tablet TAKE 1 TABLET EVERY DAY 10/07/16  Yes Dale [place], MD  Omega-3 Fatty Acids (FISH OIL BURP-LESS PO) Take 1 capsule by mouth daily.    Yes [provider]  pantoprazole (PROTONIX) 40 MG tablet TAKE 1 TABLET BY MOUTH EVERY DAY 06/18/21  Yes Dale [place], MD  propranolol (INDERAL) 20 MG tablet Take 1 tablet (20 mg total) by mouth 3 (three) times daily as needed. 05/20/18  Yes Gollan, Tollie Pizza, MD  SUMAtriptan (IMITREX) 100 MG tablet Take 100 mg by mouth as needed. 06/27/21  Yes  [provider]  tirzepatide [facility]) 2.5 MG/0.5ML Pen Inject 2.5 mg into the skin once a week. 08/14/21  Yes Dale , MD  Ubrogepant (UBRELVY) 100 MG TABS Take by mouth. 05/05/20  Yes [provider]    Family History  Problem Relation Age of Onset   Asthma Mother    Arthritis Mother    Diverticulitis Mother    Thyroid disease Mother    GER disease Mother    Migraines Mother    Arthritis Father    Hypertension Father    Hyperlipidemia Father    Thyroid disease Father    Allergic rhinitis Sister    Anemia Sister    Thyroid disease Sister    Thyroid disease Sister    Bipolar disorder Sister    Arrhythmia Sister        Ventricular Tachycardia   Healthy Brother    Diabetes Maternal Grandmother    Heart attack Maternal Grandmother    Alzheimer's disease Maternal Grandmother    Emphysema Maternal Grandfather    Breast cancer Paternal Grandmother    Heart disease Paternal Grandfather    Heart attack Paternal Grandfather      Social History   Tobacco Use   Smoking status: Never   Smokeless tobacco: Never  Vaping Use   Vaping Use: Never used  Substance Use Topics   Alcohol use: No   Drug use: No    Allergies as of 10/01/2021 - Review Complete 09/30/2021  Allergen Reaction Noted   Citalopram Hives, Itching, and Rash 05/09/2019    Review of Systems:    All systems reviewed and negative except where noted in HPI.   Physical Exam:  Constitutional: General:   Alert,  Well-developed, well-nourished, pleasant and cooperative in NAD BP 125/82   Pulse 88   Temp 98.2 F (36.8 C) (Oral)   Ht 5\' 3"  (1.6 m)   Wt 205 lb (93 kg)   BMI 36.31 kg/m   Eyes:  Sclera clear, no icterus.   Conjunctiva pink. PERRLA  Ears:  No scars, lesions or masses, Normal auditory acuity. Nose:  No deformity, discharge, or lesions. Mouth:  No deformity or lesions, oropharynx pink & moist.  Neck:  Supple; no masses or thyromegaly.  Respiratory: Normal respiratory  effort, Normal percussion  Gastrointestinal: Soft, non-tender and non-distended without masses, hepatosplenomegaly or hernias noted.  No guarding or rebound tenderness.     Cardiac: No clubbing or edema.  No cyanosis. Normal posterior tibial pedal pulses noted.  Lymphatic:  No significant cervical or axillary adenopathy.  Psych:  Alert and cooperative. Normal mood and affect.  Musculoskeletal:  Normal gait. Head normocephalic, atraumatic. Symmetrical without gross deformities. 5/5 Upper and Lower extremity strength bilaterally.  Skin: Warm. Intact without significant lesions  or rashes. No jaundice.  Neurologic:  Face symmetrical, tongue midline, Normal sensation to touch;  grossly normal neurologically.  Psych:  Alert and oriented x3, Alert and cooperative. Normal mood and affect.   Labs: CBC    Component Value Date/Time   WBC 6.4 06/18/2021 0820   RBC 4.25 06/18/2021 0820   HGB 12.1 06/18/2021 0820   HCT 36.8 06/18/2021 0820   PLT 339.0 06/18/2021 0820   MCV 86.6 06/18/2021 0820   MCH 28.9 12/23/2018 1553   MCHC 32.8 06/18/2021 0820   RDW 14.2 06/18/2021 0820   LYMPHSABS 2.4 06/18/2021 0820   MONOABS 0.4 06/18/2021 0820   EOSABS 0.1 06/18/2021 0820   BASOSABS 0.0 06/18/2021 0820   CMP     Component Value Date/Time   NA 137 06/18/2021 0820   K 4.2 06/18/2021 0820   CL 104 06/18/2021 0820   CO2 22 06/18/2021 0820   GLUCOSE 84 06/18/2021 0820   BUN 19 06/18/2021 0820   CREATININE 0.68 06/18/2021 0820   CALCIUM 9.3 06/18/2021 0820   PROT 6.9 06/18/2021 0820   ALBUMIN 4.2 06/18/2021 0820   AST 13 06/18/2021 0820   ALT 13 06/18/2021 0820   ALKPHOS 63 06/18/2021 0820   BILITOT 0.3 06/18/2021 0820   GFRNONAA >60 12/23/2018 1553   GFRAA >60 12/23/2018 1553    Imaging Studies: No results found.  Assessment and Plan:   Ame Heagle is a 45 y.o. y/o female has been referred for reflux  Patient reports ongoing reflux symptoms despite following acid reflux  lifestyle modifications  We discussed options of increasing her PPI therapy and if symptoms remain uncontrolled, proceeding with EGD.  However, since patient has been having chronic symptoms that have not improved despite adding Pepcid and lifestyle changes, patient would like to proceed with EGD at this time instead of waiting  She may have underlying hiatal hernia given that she is also reporting frequent belching  I have discussed alternative options, risks & benefits,  which include, but are not limited to, bleeding, infection, perforation,respiratory complication & drug reaction.  The patient agrees with this plan & written consent will be obtained.    Patient educated extensively on acid reflux lifestyle modification, including using a bed wedge, not eating 3 hrs before bedtime, diet modifications, and handout given for the same.   Okay to use current dose of medications at this time and these can be changed after her procedure  (Risks of PPI use were discussed with patient including bone loss, C. Diff diarrhea, pneumonia, infections, CKD, electrolyte abnormalities.  Pt. Verbalizes understanding and chooses to continue the medication.)  She was also advised that the new screening age for screening colonoscopies is  44 years of age and she will be 46 March of next year.  She was advised to have a colonoscopy done at that time and she verbalized understanding    Dr Carrie [name]  Speech recognition software was used to dictate the above note.

## 2021-10-17 ENCOUNTER — Ambulatory Visit: Payer: BC Managed Care – PPO | Admitting: Pharmacist

## 2021-10-17 ENCOUNTER — Encounter: Payer: Self-pay | Admitting: Gastroenterology

## 2021-10-17 DIAGNOSIS — Z6838 Body mass index (BMI) 38.0-38.9, adult: Secondary | ICD-10-CM

## 2021-10-17 NOTE — Patient Instructions (Signed)
Allsion,   Let's stop Mounjaro. I've updated your chart and will update Dr. Lorin Picket.   Take care!  Catie Feliz Beam, PharmD

## 2021-10-17 NOTE — Chronic Care Management (AMB) (Signed)
Chronic Care Management CCM Pharmacy Note  10/17/2021 Name:  Carrie [name] MRN:  [id number] DOB:  02-22-1976  Summary: - Reports via MyChart that she would like to stop Mounjaro due to persistent abdominal discomfort and negatively impactful lack of appetite.   Recommendations/Changes made from today's visit: - Stop Mounjaro. Follow up with PCP in January as scheduled  Subjective: Carrie [name] is an 45 y.o. year old female who is a primary patient of Einar Pheasant, MD.  The CCM team was consulted for assistance with disease management and care coordination needs.    Engaged with patient  for  medication management  for pharmacy case management and/or care coordination services.   Objective:  Medications Reviewed Today     Reviewed by Einar Pheasant, MD (Physician) on 09/30/21 at 1537  Med List Status: <None>   Medication Order Taking? Sig Documenting Provider Last Dose Status Informant  acetaminophen (TYLENOL) 500 MG tablet [id number] Yes Take 1,000-1,500 mg by mouth every 6 (six) hours as needed for moderate pain or headache.  [provider] Taking Active Self  albuterol (PROAIR HFA) 108 (90 Base) MCG/ACT inhaler [id number] Yes Inhale 2 puffs into the lungs every 6 (six) hours as needed for wheezing or shortness of breath. Einar Pheasant, MD Taking Active   amphetamine-dextroamphetamine (ADDERALL) 20 MG tablet [id number] Yes Take 20 mg by mouth 2 (two) times daily. [provider] Taking Active   CALCIUM PO [id number] Yes Take 1 tablet by mouth daily. [provider] Taking Active Self  fluticasone (FLONASE) 50 MCG/ACT nasal spray [id number] Yes PLACE 1 SPRAY INTO BOTH NOSTRILS AT BEDTIME. Einar Pheasant, MD Taking Active   Fremanezumab-vfrm (AJOVY) 225 MG/1.5ML Darden Palmer [id number] Yes Inject 225 mg into the skin every 30 (thirty) days. [provider] Taking Active   ibuprofen (ADVIL,MOTRIN) 200 MG tablet [id number] Yes Take 600 mg by mouth every 6 (six)  hours as needed for headache or moderate pain. [provider] Taking Active Self  ipratropium (ATROVENT) 0.03 % nasal spray [id number] No Place 2 sprays into both nostrils 3 (three) times daily as needed for rhinitis.  Patient not taking: Reported on 09/21/2021   Sigurd Sos, MD Not Taking Consider Medication Status and Discontinue   levocetirizine (XYZAL) 5 MG tablet [id number] Yes Take 5 mg by mouth daily. [provider] Taking Active   magnesium oxide (MAG-OX) 400 (241.3 Mg) MG tablet [id number] Yes TAKE 1 TABLET EVERY DAY Einar Pheasant, MD Taking Active Self  Omega-3 Fatty Acids (FISH OIL BURP-LESS PO) [id number] Yes Take 1 capsule by mouth daily.  [provider] Taking Active Self  pantoprazole (PROTONIX) 40 MG tablet [id number] Yes TAKE 1 TABLET BY MOUTH EVERY DAY Einar Pheasant, MD Taking Active   propranolol (INDERAL) 20 MG tablet [id number] Yes Take 1 tablet (20 mg total) by mouth 3 (three) times daily as needed. Minna Merritts, MD Taking Active   SUMAtriptan (IMITREX) 100 MG tablet [id number] Yes Take 100 mg by mouth as needed. [provider] Taking Active   tirzepatide [facility]) 2.5 MG/0.5ML Pen [id number] Yes Inject 2.5 mg into the skin once a week. Einar Pheasant, MD Taking Active   Ubrogepant [facility]) 100 MG TABS [id number] Yes Take by mouth. [provider] Taking Active             Pertinent Labs:   No results found for: HGBA1C Lab Results  Component Value Date   CHOL 214 (H) 06/18/2021   HDL 52.40 06/18/2021   LDLCALC 138 (H) 06/18/2021  TRIG 117.0 06/18/2021   CHOLHDL 4 06/18/2021   Lab Results  Component Value Date   CREATININE 0.68 06/18/2021   BUN 19 06/18/2021   NA 137 06/18/2021   K 4.2 06/18/2021   CL 104 06/18/2021   CO2 22 06/18/2021    SDOH:  (Social Determinants of Health) assessments and interventions performed:  SDOH Interventions    Flowsheet Row Most Recent Value  SDOH Interventions    Financial Strain Interventions Intervention Not Indicated       CCM Care Plan  Review of patient past medical history, allergies, medications, health status, including review of consultants reports, laboratory and other test data, was performed as part of comprehensive evaluation and provision of chronic care management services.   Care Plan : Medication Management  Updates made by De Hollingshead, RPH-CPP since 10/17/2021 12:00 AM  Completed 10/17/2021   Problem: Obesity, Migraines Resolved 10/17/2021     Long-Range Goal: Disease Progression Prevention Completed 10/17/2021  Start Date: 07/11/2021  Recent Progress: On track  Priority: High  Note:   Current Barriers:  Unable to achieve control of weight   Pharmacist Clinical Goal(s):  Over the next 90 days, patient will achieve 5-10% loss from baseline through collaboration with PharmD and provider.   Interventions: 1:1 collaboration with Einar Pheasant, MD regarding development and update of comprehensive plan of care as evidenced by provider attestation and co-signature Inter-disciplinary care team collaboration (see longitudinal plan of care) Comprehensive medication review performed; medication list updated in electronic medical record  Overweight/Obesity Complicated by Asthma: Unable to achieve goal weight loss through lifestyle modification alone; current treatment: Mounjaro 2.5 mg weekly  Reports today that she continues to feel poorly on Mounjaro. Reports bloated/fullness, little appetite, and overall poor quality of life. Requests to discontinue.  Medications/Strategies previously tried: None specifically for weight, but has tried: Topiramate for migraines  - but did not tolerate due to CNS sedation Metformin for fertility - reports GI tolerability concerns with immediate release metformin Stop Mounjaro. Follow up with PCP in January as scheduled.  Migraines: Moderately well controlled; current regimen: Ajovy 225 mg  Q30 days; magnesium oxide 400 mg daily, Ubrelvy 100 mg PRN, sumatriptan 300 mg PRN, acetaminophen 325 mg PRN, ibuprofen 200 mg PRN - though no use of analgesics lately; follows w/ Dr. Manuella Ghazi at [facility] Previously recommended to continue current regimen at this time along with collaboration with neurology  Asthma/Allergies: Uncontrolled per patient report, upcoming visit with allergist; current regimen: albuterol HFA PRN, fluticasone nasal spray PRN, levocetirizine 5 mg PRN Previously recommended to continue current regimen at this time  ADHD: Managed by psychiatry; current regimen: adderall 20 mg BID Previously recommended to continue current regimen at this time  GERD: Controlled; current regimen: pantoprazole 40 mg daily, added famotidine at bedtime as above.  GI referral as above.    Patient Goals/Self-Care Activities Over the next 90 days, patient will:  - take medications as prescribed target a minimum of 150 minutes of moderate intensity exercise weekly engage in dietary modifications by moderating portion sizes      Plan: Goals of care met. Closing CCM case  Catie Darnelle Maffucci, PharmD, [place], [place] Clinical Pharmacist [employer] [facility] [employer] [phone]

## 2021-10-24 ENCOUNTER — Ambulatory Visit
Admission: RE | Admit: 2021-10-24 | Payer: BC Managed Care – PPO | Source: Home / Self Care | Admitting: Gastroenterology

## 2021-10-24 ENCOUNTER — Encounter: Admission: RE | Payer: Self-pay | Source: Home / Self Care

## 2021-10-24 SURGERY — ESOPHAGOGASTRODUODENOSCOPY (EGD) WITH PROPOFOL
Anesthesia: General

## 2021-11-01 ENCOUNTER — Telehealth: Payer: BC Managed Care – PPO

## 2021-11-19 ENCOUNTER — Other Ambulatory Visit: Payer: BC Managed Care – PPO

## 2021-11-19 DIAGNOSIS — K219 Gastro-esophageal reflux disease without esophagitis: Secondary | ICD-10-CM

## 2021-12-04 ENCOUNTER — Telehealth: Payer: Self-pay | Admitting: Gastroenterology

## 2021-12-04 ENCOUNTER — Other Ambulatory Visit: Payer: Self-pay

## 2021-12-04 ENCOUNTER — Ambulatory Visit: Payer: BC Managed Care – PPO | Admitting: Internal Medicine

## 2021-12-04 VITALS — BP 118/70 | HR 98 | Temp 97.8°F | Resp 16 | Ht 63.0 in | Wt 207.0 lb

## 2021-12-04 DIAGNOSIS — Z1159 Encounter for screening for other viral diseases: Secondary | ICD-10-CM

## 2021-12-04 DIAGNOSIS — K219 Gastro-esophageal reflux disease without esophagitis: Secondary | ICD-10-CM

## 2021-12-04 DIAGNOSIS — G43019 Migraine without aura, intractable, without status migrainosus: Secondary | ICD-10-CM

## 2021-12-04 DIAGNOSIS — T7840XD Allergy, unspecified, subsequent encounter: Secondary | ICD-10-CM

## 2021-12-04 DIAGNOSIS — E7800 Pure hypercholesterolemia, unspecified: Secondary | ICD-10-CM | POA: Diagnosis not present

## 2021-12-04 DIAGNOSIS — F419 Anxiety disorder, unspecified: Secondary | ICD-10-CM

## 2021-12-04 NOTE — Progress Notes (Signed)
Patient ID: Carrie KoyanagiLaura [name], female   DOB: 11/14/1975, 46 y.o.   MRN: [id number]   Subjective:    Patient ID: Carrie KoyanagiLaura Lasser, female    DOB: 03/28/1976, 46 y.o.   MRN: [id number]  This visit occurred during the SARS-CoV-2 public health emergency.  Safety protocols were in place, including screening questions prior to the visit, additional usage of staff PPE, and extensive cleaning of exam room while observing appropriate contact time as indicated for disinfecting solutions.   Patient here for a scheduled follow up.     HPI Here to follow up regarding her acid reflux, anxiety and stress.  On cymbalta.  Overall appears to be handling things relatively well.  No chest pain or sob reported.  No increased cough or congestion.  Off mounjaro.  Did not tolerate.  No abdominal pain.  Bowels moving.  On protonix.  Will have break through acid reflux if forgets to take.  Request referral to [facility] for evaluation and question of need for EGD.    Past Medical History:  Diagnosis Date   Abnormal Pap smear    Allergy    Arthritis    t-12   Asthma    exercised induced   Depression    GERD (gastroesophageal reflux disease)    Headache(784.0)    Mononucleosis 1998   PONV (postoperative nausea and vomiting)    takes awhile to wake up   Past Surgical History:  Procedure Laterality Date   CARPAL TUNNEL RELEASE Bilateral 2016   CESAREAN SECTION N/A 10/30/2013   Procedure: CESAREAN SECTION;  Surgeon: Meriel Picaichard M Holland, MD;  Location: [facility];  Service: Obstetrics;  Laterality: N/A;  PRIMARY   CHOLECYSTECTOMY N/A 10/24/2017   Procedure: LAPAROSCOPIC CHOLECYSTECTOMY;  Surgeon: Lattie Hawooper, Richard E, MD;  Location: [facility] ORS;  Service: General;  Laterality: N/A;   COLPOSCOPY W/ BIOPSY / CURETTAGE     DE QUERVAIN'S RELEASE Left 2016   DILATION AND CURETTAGE OF UTERUS     FOOT FRACTURE SURGERY Left    HAND SURGERY Left    Fracture repair   IVF     Family History  Problem Relation Age of Onset   Asthma Mother     Arthritis Mother    Diverticulitis Mother    Thyroid disease Mother    GER disease Mother    Migraines Mother    Arthritis Father    Hypertension Father    Hyperlipidemia Father    Thyroid disease Father    Allergic rhinitis Sister    Anemia Sister    Thyroid disease Sister    Thyroid disease Sister    Bipolar disorder Sister    Arrhythmia Sister        Ventricular Tachycardia   Healthy Brother    Diabetes Maternal Grandmother    Heart attack Maternal Grandmother    Alzheimer's disease Maternal Grandmother    Emphysema Maternal Grandfather    Breast cancer Paternal Grandmother    Heart disease Paternal Grandfather    Heart attack Paternal Grandfather    Social History   Socioeconomic History   Marital status: Married    Spouse name: Not on file   Number of children: 1   Years of education: Master's   Highest education level: Not on file  Occupational History   Occupation: Part-time Runner, broadcasting/film/videoteacher    Comment: [employer]  Tobacco Use   Smoking status: Never   Smokeless tobacco: Never  Vaping Use   Vaping Use: Never used  Substance and Sexual Activity  Alcohol use: No   Drug use: No   Sexual activity: Not Currently    Comment: couple is afraid  to mess something up  Other Topics Concern   Not on file  Social History Narrative   Not on file   Social Determinants of Health   Financial Resource Strain: Low Risk    Difficulty of Paying Living Expenses: Not hard at all  Food Insecurity: Not on file  Transportation Needs: Not on file  Physical Activity: Not on file  Stress: Not on file  Social Connections: Not on file    Review of Systems  Constitutional:  Negative for appetite change and unexpected weight change.  HENT:  Negative for congestion and sinus pressure.   Respiratory:  Negative for cough, chest tightness and shortness of breath.   Cardiovascular:  Negative for chest pain, palpitations and leg swelling.  Gastrointestinal:  Negative for  abdominal pain, diarrhea, nausea and vomiting.  Genitourinary:  Negative for difficulty urinating and dysuria.  Musculoskeletal:  Negative for joint swelling and myalgias.  Skin:  Negative for color change and rash.  Neurological:  Negative for dizziness, light-headedness and headaches.  Psychiatric/Behavioral:  Negative for agitation and dysphoric mood.       Objective:     BP 118/70    Pulse 98    Temp 97.8 F (36.6 C)    Resp 16    Ht 5\' 3"  (1.6 m)    Wt 207 lb (93.9 kg)    SpO2 98%    BMI 36.67 kg/m  Wt Readings from Last 3 Encounters:  12/04/21 207 lb (93.9 kg)  10/01/21 205 lb (93 kg)  09/21/21 206 lb 6.4 oz (93.6 kg)    Physical Exam Vitals reviewed.  Constitutional:      General: She is not in acute distress.    Appearance: Normal appearance.  HENT:     Head: Normocephalic and atraumatic.     Right Ear: External ear normal.     Left Ear: External ear normal.  Eyes:     General: No scleral icterus.       Right eye: No discharge.        Left eye: No discharge.     Conjunctiva/sclera: Conjunctivae normal.  Neck:     Thyroid: No thyromegaly.  Cardiovascular:     Rate and Rhythm: Normal rate and regular rhythm.  Pulmonary:     Effort: No respiratory distress.     Breath sounds: Normal breath sounds. No wheezing.  Abdominal:     General: Bowel sounds are normal.     Palpations: Abdomen is soft.     Tenderness: There is no abdominal tenderness.  Musculoskeletal:        General: No swelling or tenderness.     Cervical back: Neck supple. No tenderness.  Lymphadenopathy:     Cervical: No cervical adenopathy.  Skin:    Findings: No erythema or rash.  Neurological:     Mental Status: She is alert.  Psychiatric:        Mood and Affect: Mood normal.        Behavior: Behavior normal.     Outpatient Encounter Medications as of 12/04/2021  Medication Sig   DULoxetine (CYMBALTA) 60 MG capsule Take 60 mg by mouth daily.   acetaminophen (TYLENOL) 500 MG tablet Take  1,000-1,500 mg by mouth every 6 (six) hours as needed for moderate pain or headache.    albuterol (PROAIR HFA) 108 (90 Base) MCG/ACT inhaler Inhale 2 puffs  into the lungs every 6 (six) hours as needed for wheezing or shortness of breath.   CALCIUM PO Take 1 tablet by mouth daily.   famotidine (PEPCID) 10 MG tablet Take 10 mg by mouth 2 (two) times daily.   fluticasone (FLONASE) 50 MCG/ACT nasal spray PLACE 1 SPRAY INTO BOTH NOSTRILS AT BEDTIME.   Fremanezumab-vfrm (AJOVY) 225 MG/1.5ML SOAJ Inject 225 mg into the skin every 30 (thirty) days.   ibuprofen (ADVIL,MOTRIN) 200 MG tablet Take 600 mg by mouth every 6 (six) hours as needed for headache or moderate pain.   levocetirizine (XYZAL) 5 MG tablet Take 5 mg by mouth daily.   magnesium oxide (MAG-OX) 400 (241.3 Mg) MG tablet TAKE 1 TABLET EVERY DAY   Omega-3 Fatty Acids (FISH OIL BURP-LESS PO) Take 1 capsule by mouth daily.    pantoprazole (PROTONIX) 40 MG tablet TAKE 1 TABLET BY MOUTH EVERY DAY   propranolol (INDERAL) 20 MG tablet Take 1 tablet (20 mg total) by mouth 3 (three) times daily as needed.   SUMAtriptan (IMITREX) 100 MG tablet Take 100 mg by mouth as needed.   Ubrogepant (UBRELVY) 100 MG TABS Take by mouth.   [DISCONTINUED] amphetamine-dextroamphetamine (ADDERALL) 20 MG tablet Take 20 mg by mouth 2 (two) times daily.   No facility-administered encounter medications on file as of 12/04/2021.     Lab Results  Component Value Date   WBC 6.4 06/18/2021   HGB 12.1 06/18/2021   HCT 36.8 06/18/2021   PLT 339.0 06/18/2021   GLUCOSE 84 06/18/2021   CHOL 214 (H) 06/18/2021   TRIG 117.0 06/18/2021   HDL 52.40 06/18/2021   LDLCALC 138 (H) 06/18/2021   ALT 13 06/18/2021   AST 13 06/18/2021   NA 137 06/18/2021   K 4.2 06/18/2021   CL 104 06/18/2021   CREATININE 0.68 06/18/2021   BUN 19 06/18/2021   CO2 22 06/18/2021   TSH 1.87 06/18/2021    MR Brain W Wo Contrast  Result Date: 08/25/2019 CLINICAL DATA:  Headaches with dizziness  and syncope for 2 months. EXAM: MRI HEAD WITHOUT AND WITH CONTRAST TECHNIQUE: Multiplanar, multiecho pulse sequences of the brain and surrounding structures were obtained without and with intravenous contrast. CONTRAST:  7.55mL GADAVIST GADOBUTROL 1 MMOL/ML IV SOLN COMPARISON:  None. FINDINGS: BRAIN: There is no acute infarct, acute hemorrhage or extra-axial collection. The white matter signal is normal for the patient's age. The cerebral and cerebellar volume are age-appropriate. There is no hydrocephalus. The midline structures are normal. There is no abnormal contrast enhancement. VASCULAR: The major intracranial arterial and venous sinus flow voids are normal. Susceptibility-sensitive sequences show no chronic microhemorrhage or superficial siderosis. SKULL AND UPPER CERVICAL SPINE: Calvarial bone marrow signal is normal. There is no skull base mass. The visualized upper cervical spine and soft tissues are normal. SINUSES/ORBITS: There are no fluid levels or advanced mucosal thickening. The mastoid air cells and middle ear cavities are free of fluid. The orbits are normal. IMPRESSION: Normal brain MRI. Electronically Signed   By: Deatra Robinson M.D.   On: 08/25/2019 17:39       Assessment & Plan:   Problem List Items Addressed This Visit     Acid reflux    On protonix.  If forgets - return of symptoms.  Has seen GI.  Request referral to [facility] for further evaluation and question of need for EGD.        Relevant Orders   Ambulatory referral to Gastroenterology   Allergies  Has seen an allergist.  Xyzal.        Anxiety    Increased stress.  Seeing Dr Maryruth Bun.  On cymbalta.  Follow.       Relevant Medications   DULoxetine (CYMBALTA) 60 MG capsule   Common migraine with intractable migraine    S/p SPG block.  Followed by neurology.  Continue ajovy.  Appears to be doing better.  Follow.       Relevant Medications   DULoxetine (CYMBALTA) 60 MG capsule   Hypercholesteremia - Primary     Low cholesterol diet and exercise.  Follow lipid panel.       Relevant Orders   Lipid panel   Comprehensive metabolic panel   Other Visit Diagnoses     Encounter for hepatitis C screening test for low risk patient       Relevant Orders   Hepatitis C antibody        Dale Greentree, MD

## 2021-12-04 NOTE — Telephone Encounter (Signed)
Called Endo to cancel procedure  

## 2021-12-04 NOTE — Telephone Encounter (Signed)
Inbound call from pt requesting to cancel her procedure. Pt does not want to r/s at this time.

## 2021-12-07 ENCOUNTER — Ambulatory Visit: Payer: BC Managed Care – PPO | Admitting: Internal Medicine

## 2021-12-09 ENCOUNTER — Encounter: Payer: Self-pay | Admitting: Internal Medicine

## 2021-12-09 NOTE — Assessment & Plan Note (Signed)
On protonix.  If forgets - return of symptoms.  Has seen GI.  Request referral to [facility] for further evaluation and question of need for EGD.

## 2021-12-09 NOTE — Assessment & Plan Note (Signed)
Increased stress.  Seeing Dr Kapur.  On cymbalta.  Follow.  ?

## 2021-12-09 NOTE — Assessment & Plan Note (Signed)
Low cholesterol diet and exercise.  Follow lipid panel.   

## 2021-12-09 NOTE — Assessment & Plan Note (Signed)
Has seen an allergist.  Xyzal.

## 2021-12-09 NOTE — Assessment & Plan Note (Signed)
S/p SPG block.  Followed by neurology.  Continue ajovy.  Appears to be doing better.  Follow.  

## 2021-12-10 ENCOUNTER — Telehealth: Payer: Self-pay | Admitting: Internal Medicine

## 2021-12-10 NOTE — Telephone Encounter (Signed)
Lft the number to [facility] on pt vm to call to sch. thanks

## 2021-12-13 ENCOUNTER — Ambulatory Visit: Admit: 2021-12-13 | Payer: BC Managed Care – PPO | Admitting: Gastroenterology

## 2021-12-13 SURGERY — ESOPHAGOGASTRODUODENOSCOPY (EGD) WITH PROPOFOL
Anesthesia: General

## 2021-12-16 ENCOUNTER — Other Ambulatory Visit: Payer: Self-pay | Admitting: Internal Medicine

## 2022-03-07 ENCOUNTER — Ambulatory Visit (INDEPENDENT_AMBULATORY_CARE_PROVIDER_SITE_OTHER): Payer: BC Managed Care – PPO

## 2022-03-07 ENCOUNTER — Encounter: Payer: Self-pay | Admitting: Internal Medicine

## 2022-03-07 ENCOUNTER — Ambulatory Visit (INDEPENDENT_AMBULATORY_CARE_PROVIDER_SITE_OTHER): Payer: BC Managed Care – PPO | Admitting: Internal Medicine

## 2022-03-07 VITALS — BP 122/80 | HR 84 | Temp 98.2°F | Resp 16 | Ht 63.0 in | Wt 210.8 lb

## 2022-03-07 DIAGNOSIS — M542 Cervicalgia: Secondary | ICD-10-CM

## 2022-03-07 DIAGNOSIS — Z1231 Encounter for screening mammogram for malignant neoplasm of breast: Secondary | ICD-10-CM

## 2022-03-07 DIAGNOSIS — R413 Other amnesia: Secondary | ICD-10-CM | POA: Diagnosis not present

## 2022-03-07 DIAGNOSIS — Z0000 Encounter for general adult medical examination without abnormal findings: Secondary | ICD-10-CM

## 2022-03-07 DIAGNOSIS — E7800 Pure hypercholesterolemia, unspecified: Secondary | ICD-10-CM

## 2022-03-07 DIAGNOSIS — F419 Anxiety disorder, unspecified: Secondary | ICD-10-CM

## 2022-03-07 DIAGNOSIS — J4520 Mild intermittent asthma, uncomplicated: Secondary | ICD-10-CM

## 2022-03-07 DIAGNOSIS — Z1159 Encounter for screening for other viral diseases: Secondary | ICD-10-CM

## 2022-03-07 DIAGNOSIS — G43829 Menstrual migraine, not intractable, without status migrainosus: Secondary | ICD-10-CM

## 2022-03-07 DIAGNOSIS — Z1211 Encounter for screening for malignant neoplasm of colon: Secondary | ICD-10-CM

## 2022-03-07 DIAGNOSIS — K219 Gastro-esophageal reflux disease without esophagitis: Secondary | ICD-10-CM

## 2022-03-07 LAB — COMPREHENSIVE METABOLIC PANEL
ALT: 19 U/L (ref 0–35)
AST: 20 U/L (ref 0–37)
Albumin: 4.4 g/dL (ref 3.5–5.2)
Alkaline Phosphatase: 57 U/L (ref 39–117)
BUN: 15 mg/dL (ref 6–23)
CO2: 28 mEq/L (ref 19–32)
Calcium: 9.5 mg/dL (ref 8.4–10.5)
Chloride: 101 mEq/L (ref 96–112)
Creatinine, Ser: 0.66 mg/dL (ref 0.40–1.20)
GFR: 106.14 mL/min (ref >60.00)
Glucose, Bld: 90 mg/dL (ref 70–99)
Potassium: 4.3 mEq/L (ref 3.5–5.1)
Sodium: 137 mEq/L (ref 135–145)
Total Bilirubin: 0.5 mg/dL (ref 0.2–1.2)
Total Protein: 6.9 g/dL (ref 6.0–8.3)

## 2022-03-07 LAB — CBC WITH DIFFERENTIAL/PLATELET
Basophils Absolute: 0 10*3/uL (ref 0.0–0.1)
Basophils Relative: 0.4 % (ref 0.0–3.0)
Eosinophils Absolute: 0.1 10*3/uL (ref 0.0–0.7)
Eosinophils Relative: 1.4 % (ref 0.0–5.0)
HCT: 36.8 % (ref 36.0–46.0)
Hemoglobin: 12.2 g/dL (ref 12.0–15.0)
Lymphocytes Relative: 35 % (ref 12.0–46.0)
Lymphs Abs: 2.1 10*3/uL (ref 0.7–4.0)
MCHC: 33.2 g/dL (ref 30.0–36.0)
MCV: 87 fl (ref 78.0–100.0)
Monocytes Absolute: 0.4 10*3/uL (ref 0.1–1.0)
Monocytes Relative: 5.9 % (ref 3.0–12.0)
Neutro Abs: 3.5 10*3/uL (ref 1.4–7.7)
Neutrophils Relative %: 57.3 % (ref 43.0–77.0)
Platelets: 336 10*3/uL (ref 150.0–400.0)
RBC: 4.23 Mil/uL (ref 3.87–5.11)
RDW: 14.4 % (ref 11.5–15.5)
WBC: 6.1 10*3/uL (ref 4.0–10.5)

## 2022-03-07 LAB — POCT URINE PREGNANCY: Preg Test, Ur: NEGATIVE

## 2022-03-07 LAB — LIPID PANEL
Cholesterol: 228 mg/dL — ABNORMAL HIGH (ref 0–200)
HDL: 62.2 mg/dL (ref >39.00)
NonHDL: 165.75
Total CHOL/HDL Ratio: 4
Triglycerides: 208 mg/dL — ABNORMAL HIGH (ref 0.0–149.0)
VLDL: 41.6 mg/dL — ABNORMAL HIGH (ref 0.0–40.0)

## 2022-03-07 LAB — TSH: TSH: 1.69 u[IU]/mL (ref 0.35–5.50)

## 2022-03-07 LAB — LDL CHOLESTEROL, DIRECT: Direct LDL: 134 mg/dL

## 2022-03-07 LAB — VITAMIN B12: Vitamin B-12: 214 pg/mL (ref 211–911)

## 2022-03-07 NOTE — Progress Notes (Signed)
Patient ID: Carrie KoyanagiLaura [name], female   DOB: 03/17/1976, 46 y.o.   MRN: [id number] ? ? ?Subjective:  ? ? Patient ID: Carrie KoyanagiLaura [name], female    DOB: 04/23/1976, 46 y.o.   MRN: [id number] ? ?This visit occurred during the SARS-CoV-2 public health emergency.  Safety protocols were in place, including screening questions prior to the visit, additional usage of staff PPE, and extensive cleaning of exam room while observing appropriate contact time as indicated for disinfecting solutions.  ? ?Patient here for her physical exam.  ? ?Chief Complaint  ?Patient presents with  ? Annual Exam  ?  CPE - no pap  ? .  ? ?HPI ?Overall appears to be dong relatively well.  Increased stress.  Discussed. On cymbalta.  Tries to stay active.  No chest pain or increased sob reported.  No abdominal pain.  Bowels moving.  On protonix.  Has seen neurology for headaches.  S/p SPG block.  Sees Dr Maryruth BunKapur.  Recently prescribed focalin.  Notices at times - hard to get words out.  Discussed f/u with neurology.  Increased neck pain.   ? ? ?Past Medical History:  ?Diagnosis Date  ? Abnormal Pap smear   ? Allergy   ? Arthritis   ? t-12  ? Asthma   ? exercised induced  ? Depression   ? GERD (gastroesophageal reflux disease)   ? Headache(784.0)   ? Mononucleosis 1998  ? PONV (postoperative nausea and vomiting)   ? takes awhile to wake up  ? ?Past Surgical History:  ?Procedure Laterality Date  ? CARPAL TUNNEL RELEASE Bilateral 2016  ? CESAREAN SECTION N/A 10/30/2013  ? Procedure: CESAREAN SECTION;  Surgeon: Meriel Picaichard M Holland, MD;  Location: [facility];  Service: Obstetrics;  Laterality: N/A;  PRIMARY  ? CHOLECYSTECTOMY N/A 10/24/2017  ? Procedure: LAPAROSCOPIC CHOLECYSTECTOMY;  Surgeon: Lattie Hawooper, Richard E, MD;  Location: [facility];  Service: General;  Laterality: N/A;  ? COLPOSCOPY W/ BIOPSY / CURETTAGE    ? DE QUERVAIN'S RELEASE Left 2016  ? DILATION AND CURETTAGE OF UTERUS    ? FOOT FRACTURE SURGERY Left   ? HAND SURGERY Left   ? Fracture repair  ? IVF    ? ?Family  History  ?Problem Relation Age of Onset  ? Asthma Mother   ? Arthritis Mother   ? Diverticulitis Mother   ? Thyroid disease Mother   ? GER disease Mother   ? Migraines Mother   ? Arthritis Father   ? Hypertension Father   ? Hyperlipidemia Father   ? Thyroid disease Father   ? Allergic rhinitis Sister   ? Anemia Sister   ? Thyroid disease Sister   ? Thyroid disease Sister   ? Bipolar disorder Sister   ? Arrhythmia Sister   ?     Ventricular Tachycardia  ? Healthy Brother   ? Diabetes Maternal Grandmother   ? Heart attack Maternal Grandmother   ? Alzheimer's disease Maternal Grandmother   ? Emphysema Maternal Grandfather   ? Breast cancer Paternal Grandmother   ? Heart disease Paternal Grandfather   ? Heart attack Paternal Grandfather   ? ?Social History  ? ?Socioeconomic History  ? Marital status: Married  ?  Spouse name: Not on file  ? Number of children: 1  ? Years of education: Master's  ? Highest education level: Not on file  ?Occupational History  ? Occupation: Air cabin crewart-time teacher  ?  Comment: [employer]  ?Tobacco Use  ? Smoking status: Never  ? Smokeless tobacco:  Never  ?Vaping Use  ? Vaping Use: Never used  ?Substance and Sexual Activity  ? Alcohol use: No  ? Drug use: No  ? Sexual activity: Not Currently  ?  Comment: couple is afraid  to mess something up  ?Other Topics Concern  ? Not on file  ?Social History Narrative  ? Not on file  ? ?Social Determinants of Health  ? ?Financial Resource Strain: Low Risk   ? Difficulty of Paying Living Expenses: Not hard at all  ?Food Insecurity: Not on file  ?Transportation Needs: Not on file  ?Physical Activity: Not on file  ?Stress: Not on file  ?Social Connections: Not on file  ? ? ? ?Review of Systems  ?Constitutional:  Negative for appetite change and unexpected weight change.  ?HENT:  Negative for congestion, sinus pressure and sore throat.   ?Eyes:  Negative for pain and visual disturbance.  ?Respiratory:  Negative for cough, chest tightness and shortness of  breath.   ?Cardiovascular:  Negative for chest pain, palpitations and leg swelling.  ?Gastrointestinal:  Negative for abdominal pain, diarrhea, nausea and vomiting.  ?Genitourinary:  Negative for difficulty urinating and dysuria.  ?Musculoskeletal:  Negative for back pain and joint swelling.  ?Skin:  Negative for color change and rash.  ?Neurological:  Negative for dizziness, light-headedness and headaches.  ?Hematological:  Negative for adenopathy. Does not bruise/bleed easily.  ?Psychiatric/Behavioral:  Negative for agitation and dysphoric mood.   ? ?   ?Objective:  ?  ? ?BP 122/80 (BP Location: Left Arm, Patient Position: Sitting, Cuff Size: Small)   Pulse 84   Temp 98.2 ?F (36.8 ?C) (Temporal)   Resp 16   Ht 5\' 3"  (1.6 m)   Wt 210 lb 12.8 oz (95.6 kg)   SpO2 100%   BMI 37.34 kg/m?  ?Wt Readings from Last 3 Encounters:  ?03/07/22 210 lb 12.8 oz (95.6 kg)  ?12/04/21 207 lb (93.9 kg)  ?10/01/21 205 lb (93 kg)  ? ? ?Physical Exam ?Vitals reviewed.  ?Constitutional:   ?   General: She is not in acute distress. ?   Appearance: Normal appearance. She is well-developed.  ?HENT:  ?   Head: Normocephalic and atraumatic.  ?   Right Ear: External ear normal.  ?   Left Ear: External ear normal.  ?Eyes:  ?   General: No scleral icterus.    ?   Right eye: No discharge.     ?   Left eye: No discharge.  ?   Conjunctiva/sclera: Conjunctivae normal.  ?Neck:  ?   Thyroid: No thyromegaly.  ?Cardiovascular:  ?   Rate and Rhythm: Normal rate and regular rhythm.  ?Pulmonary:  ?   Effort: No tachypnea, accessory muscle usage or respiratory distress.  ?   Breath sounds: Normal breath sounds. No decreased breath sounds or wheezing.  ?Chest:  ?Breasts: ?   Right: No inverted nipple, mass, nipple discharge or tenderness (no axillary adenopathy).  ?   Left: No inverted nipple, mass, nipple discharge or tenderness (no axilarry adenopathy).  ?Abdominal:  ?   General: Bowel sounds are normal.  ?   Palpations: Abdomen is soft.  ?    Tenderness: There is no abdominal tenderness.  ?Musculoskeletal:     ?   General: No swelling or tenderness.  ?   Cervical back: Neck supple.  ?Lymphadenopathy:  ?   Cervical: No cervical adenopathy.  ?Skin: ?   Findings: No erythema or rash.  ?Neurological:  ?   Mental  Status: She is alert and oriented to person, place, and time.  ?Psychiatric:     ?   Mood and Affect: Mood normal.     ?   Behavior: Behavior normal.  ? ? ? ?Outpatient Encounter Medications as of 03/07/2022  ?Medication Sig  ? acetaminophen (TYLENOL) 500 MG tablet Take 1,000-1,500 mg by mouth every 6 (six) hours as needed for moderate pain or headache.   ? albuterol (PROAIR HFA) 108 (90 Base) MCG/ACT inhaler Inhale 2 puffs into the lungs every 6 (six) hours as needed for wheezing or shortness of breath.  ? CALCIUM PO Take 1 tablet by mouth daily.  ? dexmethylphenidate (FOCALIN XR) 20 MG 24 hr capsule   ? DULoxetine (CYMBALTA) 60 MG capsule Take 60 mg by mouth daily.  ? famotidine (PEPCID) 10 MG tablet Take 10 mg by mouth 2 (two) times daily.  ? fluticasone (FLONASE) 50 MCG/ACT nasal spray PLACE 1 SPRAY INTO BOTH NOSTRILS AT BEDTIME.  ? Fremanezumab-vfrm (AJOVY) 225 MG/1.5ML SOAJ Inject 225 mg into the skin every 30 (thirty) days.  ? ibuprofen (ADVIL,MOTRIN) 200 MG tablet Take 600 mg by mouth every 6 (six) hours as needed for headache or moderate pain.  ? levocetirizine (XYZAL) 5 MG tablet Take 5 mg by mouth daily.  ? magnesium oxide (MAG-OX) 400 (241.3 Mg) MG tablet TAKE 1 TABLET EVERY DAY  ? Omega-3 Fatty Acids (FISH OIL BURP-LESS PO) Take 1 capsule by mouth daily.   ? pantoprazole (PROTONIX) 40 MG tablet TAKE 1 TABLET BY MOUTH EVERY DAY  ? propranolol (INDERAL) 20 MG tablet Take 1 tablet (20 mg total) by mouth 3 (three) times daily as needed.  ? SUMAtriptan (IMITREX) 100 MG tablet Take 100 mg by mouth as needed.  ? Ubrogepant (UBRELVY) 100 MG TABS Take by mouth.  ? ?No facility-administered encounter medications on file as of 03/07/2022.  ?  ? ?Lab  Results  ?Component Value Date  ? WBC 6.1 03/07/2022  ? HGB 12.2 03/07/2022  ? HCT 36.8 03/07/2022  ? PLT 336.0 03/07/2022  ? GLUCOSE 90 03/07/2022  ? CHOL 228 (H) 03/07/2022  ? TRIG 208.0 (H) 03/07/2022  ? HDL 62.20 04/

## 2022-03-08 LAB — HEPATITIS C ANTIBODY
Hepatitis C Ab: NONREACTIVE
SIGNAL TO CUT-OFF: 0.16 (ref <1.00)

## 2022-03-17 ENCOUNTER — Telehealth: Payer: Self-pay | Admitting: Internal Medicine

## 2022-03-17 ENCOUNTER — Encounter: Payer: Self-pay | Admitting: Internal Medicine

## 2022-03-17 NOTE — Assessment & Plan Note (Signed)
Persistent neck pain.  Check c-spine xray.   

## 2022-03-17 NOTE — Telephone Encounter (Signed)
Sees Dr Manuella Ghazi [facility].  Needs a f/u appt - for memory issues and not being able to get out words.  Please call and schedule f/u appt.  Also, mammogram is ordered.  Need to schedule.  Let her know that I did place an order for referral to [facility]. Someone should be contacting her with an appt date and time.   ?

## 2022-03-17 NOTE — Assessment & Plan Note (Signed)
Physical today 03/06/22.   Age 46 - due colonoscopy.  Mammogram ordered.  Need to schedule.  Follow cholesterol.  ?

## 2022-03-17 NOTE — Assessment & Plan Note (Signed)
Some memory change and feeling as if cannot get words out.  Needs f/u with neurology.  Check routine labs and B12.  Discussed concentration and focus - contributing to above.  Follow  ?

## 2022-03-17 NOTE — Assessment & Plan Note (Signed)
Increased stress.  Seeing Dr Maryruth Bun.  On cymbalta.  Follow.  ?

## 2022-03-17 NOTE — Assessment & Plan Note (Signed)
Has seen neurology.  Discussed f/u with neurology.  Follow.   ?

## 2022-03-17 NOTE — Assessment & Plan Note (Signed)
Breathing overall stable.  ?

## 2022-03-17 NOTE — Assessment & Plan Note (Signed)
On protonix.  Had reported if forgets - return of symptoms.  Has seen GI.  Request referral to [facility] for further evaluation and question of need for EGD.   ?

## 2022-03-17 NOTE — Assessment & Plan Note (Signed)
Low cholesterol diet and exercise.  Follow lipid panel.   

## 2022-03-22 NOTE — Telephone Encounter (Signed)
Pt is aware of message below.

## 2022-04-23 ENCOUNTER — Ambulatory Visit
Admission: RE | Admit: 2022-04-23 | Discharge: 2022-04-23 | Disposition: A | Discharge status: Home / Self Care | Payer: BC Managed Care – PPO | Source: Ambulatory Visit | Attending: Internal Medicine | Admitting: Internal Medicine

## 2022-04-23 DIAGNOSIS — Z1231 Encounter for screening mammogram for malignant neoplasm of breast: Secondary | ICD-10-CM | POA: Insufficient documentation

## 2022-06-12 ENCOUNTER — Encounter: Payer: Self-pay | Admitting: Internal Medicine

## 2022-06-12 DIAGNOSIS — Z0184 Encounter for antibody response examination: Secondary | ICD-10-CM

## 2022-06-13 ENCOUNTER — Other Ambulatory Visit (INDEPENDENT_AMBULATORY_CARE_PROVIDER_SITE_OTHER): Payer: BC Managed Care – PPO

## 2022-06-13 DIAGNOSIS — Z0184 Encounter for antibody response examination: Secondary | ICD-10-CM | POA: Diagnosis not present

## 2022-06-13 NOTE — Telephone Encounter (Signed)
I have placed the order for the labs.  Please schedule lab appt.  Thanks.

## 2022-06-13 NOTE — Telephone Encounter (Signed)
Pt sched for this afternoon - 2pm

## 2022-06-13 NOTE — Telephone Encounter (Signed)
Orders placed for labs.  Needs lab appt scheduled.

## 2022-06-14 LAB — MEASLES/MUMPS/RUBELLA IMMUNITY
Mumps IgG: 37.7 AU/mL
Rubella: 2.16 Index
Rubeola IgG: >300 AU/mL

## 2022-06-14 LAB — VARICELLA ZOSTER ANTIBODY, IGG: Varicella IgG: 495.1 index

## 2022-06-21 ENCOUNTER — Other Ambulatory Visit: Payer: Self-pay | Admitting: Internal Medicine

## 2022-07-04 NOTE — Telephone Encounter (Signed)
Please call and clarify.  I am not sure I fully understand this message.  Thanks

## 2022-07-08 ENCOUNTER — Ambulatory Visit: Payer: BC Managed Care – PPO | Admitting: Internal Medicine

## 2022-07-08 ENCOUNTER — Encounter: Payer: Self-pay | Admitting: Internal Medicine

## 2022-07-08 VITALS — BP 118/72 | HR 106 | Temp 98.7°F | Ht 63.0 in | Wt 212.0 lb

## 2022-07-08 DIAGNOSIS — J4520 Mild intermittent asthma, uncomplicated: Secondary | ICD-10-CM | POA: Diagnosis not present

## 2022-07-08 DIAGNOSIS — R635 Abnormal weight gain: Secondary | ICD-10-CM

## 2022-07-08 DIAGNOSIS — F419 Anxiety disorder, unspecified: Secondary | ICD-10-CM

## 2022-07-08 DIAGNOSIS — G43829 Menstrual migraine, not intractable, without status migrainosus: Secondary | ICD-10-CM | POA: Diagnosis not present

## 2022-07-08 DIAGNOSIS — K219 Gastro-esophageal reflux disease without esophagitis: Secondary | ICD-10-CM | POA: Diagnosis not present

## 2022-07-08 DIAGNOSIS — E7800 Pure hypercholesterolemia, unspecified: Secondary | ICD-10-CM

## 2022-07-08 DIAGNOSIS — T7840XD Allergy, unspecified, subsequent encounter: Secondary | ICD-10-CM

## 2022-07-08 DIAGNOSIS — R41840 Attention and concentration deficit: Secondary | ICD-10-CM

## 2022-07-08 NOTE — Progress Notes (Signed)
Patient ID: Rema Lievanos, female   DOB: February 16, 1976, 46 y.o.   MRN: [id number]   Subjective:    Patient ID: [name], female    DOB: 1976/06/05, 46 y.o.   MRN: [id number]   Patient here for  Chief Complaint  Patient presents with   Follow-up   heel pain bilateral   .   HPI Here to follow up regarding increased stress, headaches and GERD.  Sees Dr Maryruth Bun.  On cymbalta. Discussed increased stress.  Feels cymbalta has helped more than other medications she has tried.  Focalin recently increased to 25mg  q day.  Concern regarding weight gain.  Discussed.  She is watching what she eats.  Discussed diet and exercise.  No chest pain or sob reported.  No abdominal pain reported.     Past Medical History:  Diagnosis Date   Abnormal Pap smear    Allergy    Arthritis    t-12   Asthma    exercised induced   Depression    GERD (gastroesophageal reflux disease)    Headache(784.0)    Mononucleosis 1998   PONV (postoperative nausea and vomiting)    takes awhile to wake up   Past Surgical History:  Procedure Laterality Date   CARPAL TUNNEL RELEASE Bilateral 2016   CESAREAN SECTION N/A 10/30/2013   Procedure: CESAREAN SECTION;  Surgeon: 11/01/2013, MD;  Location: [facility];  Service: Obstetrics;  Laterality: N/A;  PRIMARY   CHOLECYSTECTOMY N/A 10/24/2017   Procedure: LAPAROSCOPIC CHOLECYSTECTOMY;  Surgeon: 10/26/2017, MD;  Location: [facility];  Service: General;  Laterality: N/A;   COLPOSCOPY W/ BIOPSY / CURETTAGE     DE QUERVAIN'S RELEASE Left 2016   DILATION AND CURETTAGE OF UTERUS     FOOT FRACTURE SURGERY Left    HAND SURGERY Left    Fracture repair   IVF     Family History  Problem Relation Age of Onset   Asthma Mother    Arthritis Mother    Diverticulitis Mother    Thyroid disease Mother    GER disease Mother    Migraines Mother    Arthritis Father    Hypertension Father    Hyperlipidemia Father    Thyroid disease Father    Allergic rhinitis Sister    Anemia  Sister    Thyroid disease Sister    Thyroid disease Sister    Bipolar disorder Sister    Arrhythmia Sister        Ventricular Tachycardia   Healthy Brother    Diabetes Maternal Grandmother    Heart attack Maternal Grandmother    Alzheimer's disease Maternal Grandmother    Emphysema Maternal Grandfather    Breast cancer Paternal Grandmother    Heart disease Paternal Grandfather    Heart attack Paternal Grandfather    Social History   Socioeconomic History   Marital status: Married    Spouse name: Not on file   Number of children: 1   Years of education: Master's   Highest education level: Not on file  Occupational History   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  Tobacco Use   Smoking status: Never   Smokeless tobacco: Never  Vaping Use   Vaping Use: Never used  Substance and Sexual Activity   Alcohol use: No   Drug use: No   Sexual activity: Not Currently    Comment: couple is afraid  to mess something up  Other Topics Concern   Not on file  Social  History Narrative   Not on file   Social Determinants of Health   Financial Resource Strain: Low Risk  (10/17/2021)   Overall Financial Resource Strain (CARDIA)    Difficulty of Paying Living Expenses: Not hard at all  Food Insecurity: Not on file  Transportation Needs: Not on file  Physical Activity: Not on file  Stress: Not on file  Social Connections: Not on file     Review of Systems  Constitutional:  Negative for appetite change and unexpected weight change.  HENT:  Negative for congestion and sinus pressure.   Respiratory:  Negative for cough, chest tightness and shortness of breath.   Cardiovascular:  Negative for chest pain, palpitations and leg swelling.  Gastrointestinal:  Negative for abdominal pain, diarrhea, nausea and vomiting.  Genitourinary:  Negative for difficulty urinating and dysuria.  Musculoskeletal:  Negative for joint swelling and myalgias.  Skin:  Negative for color  change and rash.  Neurological:  Negative for dizziness, light-headedness and headaches.  Psychiatric/Behavioral:  Negative for agitation and dysphoric mood.        Objective:     BP 118/72 (BP Location: Left Arm, Patient Position: Sitting, Cuff Size: Large)   Pulse (!) 106   Temp 98.7 F (37.1 C) (Oral)   Ht 5\' 3"  (1.6 m)   Wt 212 lb (96.2 kg)   SpO2 98%   BMI 37.55 kg/m  Wt Readings from Last 3 Encounters:  07/08/22 212 lb (96.2 kg)  03/07/22 210 lb 12.8 oz (95.6 kg)  12/04/21 207 lb (93.9 kg)    Physical Exam Vitals reviewed.  Constitutional:      General: She is not in acute distress.    Appearance: Normal appearance.  HENT:     Head: Normocephalic and atraumatic.     Right Ear: External ear normal.     Left Ear: External ear normal.  Eyes:     General: No scleral icterus.       Right eye: No discharge.        Left eye: No discharge.     Conjunctiva/sclera: Conjunctivae normal.  Neck:     Thyroid: No thyromegaly.  Cardiovascular:     Rate and Rhythm: Normal rate and regular rhythm.  Pulmonary:     Effort: No respiratory distress.     Breath sounds: Normal breath sounds. No wheezing.  Abdominal:     General: Bowel sounds are normal.     Palpations: Abdomen is soft.     Tenderness: There is no abdominal tenderness.  Musculoskeletal:        General: No swelling or tenderness.     Cervical back: Neck supple. No tenderness.  Lymphadenopathy:     Cervical: No cervical adenopathy.  Skin:    Findings: No erythema or rash.  Neurological:     Mental Status: She is alert.  Psychiatric:        Mood and Affect: Mood normal.        Behavior: Behavior normal.      Outpatient Encounter Medications as of 07/08/2022  Medication Sig   acetaminophen (TYLENOL) 500 MG tablet Take 1,000-1,500 mg by mouth every 6 (six) hours as needed for moderate pain or headache.    albuterol (PROAIR HFA) 108 (90 Base) MCG/ACT inhaler Inhale 2 puffs into the lungs every 6 (six) hours  as needed for wheezing or shortness of breath.   CALCIUM PO Take 1 tablet by mouth daily.   dexmethylphenidate (FOCALIN XR) 20 MG 24 hr capsule  DULoxetine (CYMBALTA) 60 MG capsule Take 60 mg by mouth daily.   famotidine (PEPCID) 10 MG tablet Take 10 mg by mouth at bedtime. PRN at bedtime.   fluticasone (FLONASE) 50 MCG/ACT nasal spray PLACE 1 SPRAY INTO BOTH NOSTRILS AT BEDTIME.   Fremanezumab-vfrm (AJOVY) 225 MG/1.5ML SOAJ Inject 225 mg into the skin every 30 (thirty) days.   ibuprofen (ADVIL,MOTRIN) 200 MG tablet Take 600 mg by mouth every 6 (six) hours as needed for headache or moderate pain.   magnesium oxide (MAG-OX) 400 (241.3 Mg) MG tablet TAKE 1 TABLET EVERY DAY   Omega-3 Fatty Acids (FISH OIL BURP-LESS PO) Take 1 capsule by mouth daily.    pantoprazole (PROTONIX) 40 MG tablet TAKE 1 TABLET BY MOUTH EVERY DAY   propranolol (INDERAL) 20 MG tablet Take 1 tablet (20 mg total) by mouth 3 (three) times daily as needed.   SUMAtriptan (IMITREX) 100 MG tablet Take 100 mg by mouth as needed.   Ubrogepant (UBRELVY) 100 MG TABS Take by mouth.   [DISCONTINUED] levocetirizine (XYZAL) 5 MG tablet Take 5 mg by mouth daily. (Patient not taking: Reported on 07/08/2022)   No facility-administered encounter medications on file as of 07/08/2022.     Lab Results  Component Value Date   WBC 6.1 03/07/2022   HGB 12.2 03/07/2022   HCT 36.8 03/07/2022   PLT 336.0 03/07/2022   GLUCOSE 90 03/07/2022   CHOL 228 (H) 03/07/2022   TRIG 208.0 (H) 03/07/2022   HDL 62.20 03/07/2022   LDLDIRECT 134.0 03/07/2022   LDLCALC 138 (H) 06/18/2021   ALT 19 03/07/2022   AST 20 03/07/2022   NA 137 03/07/2022   K 4.3 03/07/2022   CL 101 03/07/2022   CREATININE 0.66 03/07/2022   BUN 15 03/07/2022   CO2 28 03/07/2022   TSH 1.69 03/07/2022    MM 3D SCREEN BREAST BILATERAL  Result Date: 04/24/2022 CLINICAL DATA:  Screening. EXAM: DIGITAL SCREENING BILATERAL MAMMOGRAM WITH TOMOSYNTHESIS AND CAD TECHNIQUE:  Bilateral screening digital craniocaudal and mediolateral oblique mammograms were obtained. Bilateral screening digital breast tomosynthesis was performed. The images were evaluated with computer-aided detection. COMPARISON:  Previous exam(s). ACR Breast Density Category b: There are scattered areas of fibroglandular density. FINDINGS: There are no findings suspicious for malignancy. IMPRESSION: No mammographic evidence of malignancy. A result letter of this screening mammogram will be mailed directly to the patient. RECOMMENDATION: Screening mammogram in one year. (Code:SM-B-01Y) BI-RADS CATEGORY  1: Negative. Electronically Signed   By: Hulan Saas M.D.   On: 04/24/2022 14:39       Assessment & Plan:   Problem List Items Addressed This Visit     Acid reflux    On protonix.  See previus note.  Was referred to [facility].  Per note, messaged sent to schedule appt.       Allergies    Has seen an allergist previously.  Flonase.  Follow.       Anxiety    On cymbalta.  Had concerns regarding weight gain.  Discussed.  Feels cymbalta has helped (better than other medications).  Discuss with Dr Maryruth Bun.        Headache, menstrual migraine    Has been followed by neurology.       Hypercholesteremia - Primary    Low cholesterol diet and exercise.  Follow lipid panel.       Lack of concentration    Followed by Dr Maryruth Bun.  On focalin.  Dose recently increased to 25mg  q day.  Mild intermittent asthma    Breathing overall stable.       Weight gain    Concern regarding weight gain.  Discussed.  She is watching her diet.  Monitoring intake.  Discussed exercise.  Discussed medications.  Will contact Dr Maryruth Bun to make her aware of concerns.        Dale Lyle, MD

## 2022-07-15 ENCOUNTER — Encounter: Payer: Self-pay | Admitting: Internal Medicine

## 2022-07-15 ENCOUNTER — Telehealth: Payer: Self-pay | Admitting: Internal Medicine

## 2022-07-15 DIAGNOSIS — R635 Abnormal weight gain: Secondary | ICD-10-CM | POA: Insufficient documentation

## 2022-07-15 NOTE — Assessment & Plan Note (Signed)
Followed by Dr Maryruth Bun.  On focalin.  Dose recently increased to 25mg  q day.

## 2022-07-15 NOTE — Assessment & Plan Note (Signed)
Concern regarding weight gain.  Discussed.  She is watching her diet.  Monitoring intake.  Discussed exercise.  Discussed medications.  Will contact Dr Maryruth Bun to make her aware of concerns.

## 2022-07-15 NOTE — Assessment & Plan Note (Signed)
Has been followed by neurology.   

## 2022-07-15 NOTE — Assessment & Plan Note (Signed)
On cymbalta.  Had concerns regarding weight gain.  Discussed.  Feels cymbalta has helped (better than other medications).  Discuss with Dr Maryruth Bun.

## 2022-07-15 NOTE — Assessment & Plan Note (Signed)
Low cholesterol diet and exercise.  Follow lipid panel.   

## 2022-07-15 NOTE — Assessment & Plan Note (Signed)
Has seen an allergist previously.  Flonase.  Follow.

## 2022-07-15 NOTE — Telephone Encounter (Signed)
My chart message sent to [facility] regarding f/u with Dr Maryruth Bun.

## 2022-07-15 NOTE — Assessment & Plan Note (Signed)
Breathing overall stable.  ?

## 2022-07-15 NOTE — Assessment & Plan Note (Signed)
On protonix.  See previus note.  Was referred to [facility].  Per note, messaged sent to schedule appt.

## 2022-08-18 ENCOUNTER — Ambulatory Visit
Admission: EM | Admit: 2022-08-18 | Discharge: 2022-08-18 | Disposition: A | Discharge status: Home / Self Care | Payer: BC Managed Care – PPO

## 2022-08-18 ENCOUNTER — Ambulatory Visit (INDEPENDENT_AMBULATORY_CARE_PROVIDER_SITE_OTHER): Payer: BC Managed Care – PPO

## 2022-08-18 ENCOUNTER — Telehealth: Payer: BC Managed Care – PPO | Admitting: Physician Assistant

## 2022-08-18 ENCOUNTER — Encounter: Payer: Self-pay | Admitting: Emergency Medicine

## 2022-08-18 DIAGNOSIS — R079 Chest pain, unspecified: Secondary | ICD-10-CM

## 2022-08-18 DIAGNOSIS — J069 Acute upper respiratory infection, unspecified: Secondary | ICD-10-CM

## 2022-08-18 DIAGNOSIS — R059 Cough, unspecified: Secondary | ICD-10-CM

## 2022-08-18 DIAGNOSIS — J40 Bronchitis, not specified as acute or chronic: Secondary | ICD-10-CM | POA: Diagnosis not present

## 2022-08-18 MED ORDER — LEVALBUTEROL TARTRATE 45 MCG/ACT IN AERO: 1.0000 | INHALATION_SPRAY | Freq: Four times a day (QID) | RESPIRATORY_TRACT | 0 refills | Status: AC | PRN

## 2022-08-18 MED ORDER — BENZONATATE 100 MG PO CAPS
200.0000 mg | ORAL_CAPSULE | Freq: Three times a day (TID) | ORAL | 0 refills | Status: DC
Start: 2022-08-18 — End: 2022-12-05

## 2022-08-18 MED ORDER — PROMETHAZINE-DM 6.25-15 MG/5ML PO SYRP: 5.0000 mL | ORAL_SOLUTION | Freq: Four times a day (QID) | ORAL | 0 refills | Status: DC | PRN

## 2022-08-18 MED ORDER — PREDNISONE 20 MG PO TABS: 60.0000 mg | ORAL_TABLET | Freq: Every day | ORAL | 0 refills | Status: AC

## 2022-08-18 MED ORDER — AEROCHAMBER MV MISC: 2 refills | Status: AC

## 2022-08-18 NOTE — Progress Notes (Signed)
Because of chest pain with the fever and other URI symptoms, I feel your condition warrants further evaluation and I recommend that you be seen in a face to face visit.   NOTE: There will be NO CHARGE for this eVisit   If you are having a true medical emergency please call 911.      For an urgent face to face visit, [facility] has seven urgent care centers for your convenience:     [facility] [place] Get Driving Directions [phone] [place], [place] [address]    [facility]) Get Driving Directions [phone] [place], [place] [address]  [facility] Get Driving Directions [phone] [address] [place],  [place]  [address]  [facility] [address] Get Driving Directions  [phone] [address] [place],  [place] [address]   [facility] Get Driving Directions [phone] [place], [place], [place] [address]   [facility] Get Driving Directions  [phone] [address] [place], [place] [address]   [facility] [place] Get Driving Directions [phone] [address], [place], [place] [address]  Your MyChart E-visit questionnaire answers were reviewed by a board certified advanced clinical practitioner to complete your personal care plan based on your specific symptoms.  Thank you for using e-Visits.

## 2022-08-18 NOTE — ED Provider Notes (Signed)
[facility]    CSN: [id number] Arrival date & time: 08/18/22  1311      History   Chief Complaint Chief Complaint  Patient presents with   Cough   Shortness of Breath    HPI Carrie [name] is a 46 y.o. female.   HPI  46 year old female here for evaluation of respiratory complaints.  Patient reports that she has had cough and chest congestion for the last 4 days.  She states that in the last days she started to have some scant sputum production in the morning and at night.  She describes it as being yellow in color.  She also states she has had off-and-on fevers with a Tmax of 100.  She does endorse shortness of breath and wheezing and also burning in her chest when she coughs.  She denies runny nose, nasal congestion, ear pain, or sore throat.  She had a virtual visit earlier and she was encouraged to be evaluated in person.  Past Medical History:  Diagnosis Date   Abnormal Pap smear    Allergy    Arthritis    t-12   Asthma    exercised induced   Depression    GERD (gastroesophageal reflux disease)    Headache(784.0)    Mononucleosis 1998   PONV (postoperative nausea and vomiting)    takes awhile to wake up    Patient Active Problem List   Diagnosis Date Noted   Weight gain 07/15/2022   Memory change 03/07/2022   Neck pain 03/07/2022   Chronic idiopathic urticaria 07/20/2021   Upper airway cough syndrome 07/20/2021   Chronic nonallergic rhinitis 07/20/2021   Mild intermittent asthma 07/20/2021   Congestion of nasal sinus 06/25/2021   Body mass index (BMI) of 38.0 to 38.9 in adult 06/25/2021   Rash 06/18/2021   Allergies 06/18/2021   History of sensory changes 08/09/2020   Fatigue 08/09/2020   Fall 12/05/2019   Dizziness 08/01/2019   Fever 07/27/2019   Breast pain, left 03/21/2019   Chest pain 12/23/2018   PVC (premature ventricular contraction) 05/17/2018   Tachycardia 03/25/2018   Healthcare maintenance 03/07/2018   Anxiety 03/07/2018    Carpal tunnel syndrome 10/06/2017   Radial styloid tenosynovitis 10/06/2017   Calculus of gallbladder with chronic cholecystitis without obstruction    Acute recurrent maxillary sinusitis 10/21/2016   Lack of concentration 09/29/2016   Closed displaced fracture of second metatarsal bone of left foot 06/18/2016   Adaptation reaction 06/13/2015   Allergic rhinitis 06/13/2015   H/O arthritis 06/13/2015   Airway hyperreactivity 06/13/2015   Acid reflux 06/13/2015   Common migraine with intractable migraine 06/13/2015   Hypercholesteremia 06/13/2015   Headache, menstrual migraine 06/13/2015   Pregnancy 10/30/2013    Past Surgical History:  Procedure Laterality Date   CARPAL TUNNEL RELEASE Bilateral 2016   CESAREAN SECTION N/A 10/30/2013   Procedure: CESAREAN SECTION;  Surgeon: Meriel Pica, MD;  Location: [facility];  Service: Obstetrics;  Laterality: N/A;  PRIMARY   CHOLECYSTECTOMY N/A 10/24/2017   Procedure: LAPAROSCOPIC CHOLECYSTECTOMY;  Surgeon: Lattie Haw, MD;  Location: [facility];  Service: General;  Laterality: N/A;   COLPOSCOPY W/ BIOPSY / CURETTAGE     DE QUERVAIN'S RELEASE Left 2016   DILATION AND CURETTAGE OF UTERUS     FOOT FRACTURE SURGERY Left    HAND SURGERY Left    Fracture repair   IVF      OB History     Gravida  2   Para  1   Term  1   Preterm  0   AB  1   Living  1      SAB  1   IAB  0   Ectopic  0   Multiple  0   Live Births  1            Home Medications    Prior to Admission medications   Medication Sig Start Date End Date Taking? Authorizing Provider  benzonatate (TESSALON) 100 MG capsule Take 2 capsules (200 mg total) by mouth every 8 (eight) hours. 08/18/22  Yes Becky Augusta, NP  dexmethylphenidate (FOCALIN XR) 20 MG 24 hr capsule  01/25/22  Yes [provider]  Fremanezumab-vfrm (AJOVY) 225 MG/1.5ML SOAJ Inject 225 mg into the skin every 30 (thirty) days. 06/27/21  Yes [provider]  levalbuterol  (XOPENEX HFA) 45 MCG/ACT inhaler Inhale 1-2 puffs into the lungs every 6 (six) hours as needed for wheezing. 08/18/22  Yes Becky Augusta, NP  magnesium oxide (MAG-OX) 400 (241.3 Mg) MG tablet TAKE 1 TABLET EVERY DAY 10/07/16  Yes Dale Clay, MD  Omega-3 Fatty Acids (FISH OIL BURP-LESS PO) Take 1 capsule by mouth daily.    Yes [provider]  pantoprazole (PROTONIX) 40 MG tablet TAKE 1 TABLET BY MOUTH EVERY DAY 06/21/22  Yes Dale [place]burg, MD  predniSONE (DELTASONE) 20 MG tablet Take 3 tablets (60 mg total) by mouth daily with breakfast for 5 days. 3 tablets daily for 5 days. 08/18/22 08/23/22 Yes Becky Augusta, NP  promethazine-dextromethorphan (PROMETHAZINE-DM) 6.25-15 MG/5ML syrup Take 5 mLs by mouth 4 (four) times daily as needed. 08/18/22  Yes Becky Augusta, NP  Spacer/Aero-Holding Chambers (AEROCHAMBER MV) inhaler Use as instructed 08/18/22  Yes Becky Augusta, NP  TRINTELLIX 10 MG TABS tablet Take by mouth. 07/25/22  Yes [provider]  acetaminophen (TYLENOL) 500 MG tablet Take 1,000-1,500 mg by mouth every 6 (six) hours as needed for moderate pain or headache.     [provider]  CALCIUM PO Take 1 tablet by mouth daily.    [provider]  famotidine (PEPCID) 10 MG tablet Take 10 mg by mouth at bedtime. PRN at bedtime.    [provider]  fluticasone (FLONASE) 50 MCG/ACT nasal spray PLACE 1 SPRAY INTO BOTH NOSTRILS AT BEDTIME. 12/13/19   Dale Oakhaven, MD  ibuprofen (ADVIL,MOTRIN) 200 MG tablet Take 600 mg by mouth every 6 (six) hours as needed for headache or moderate pain.    [provider]  propranolol (INDERAL) 20 MG tablet Take 1 tablet (20 mg total) by mouth 3 (three) times daily as needed. 05/20/18   Antonieta Iba, MD  SUMAtriptan (IMITREX) 100 MG tablet Take 100 mg by mouth as needed. 06/27/21   [provider]  Ubrogepant (UBRELVY) 100 MG TABS Take by mouth. 05/05/20   [provider]    Family History Family  History  Problem Relation Age of Onset   Asthma Mother    Arthritis Mother    Diverticulitis Mother    Thyroid disease Mother    GER disease Mother    Migraines Mother    Arthritis Father    Hypertension Father    Hyperlipidemia Father    Thyroid disease Father    Allergic rhinitis Sister    Anemia Sister    Thyroid disease Sister    Thyroid disease Sister    Bipolar disorder Sister    Arrhythmia Sister        Ventricular  Tachycardia   Healthy Brother    Diabetes Maternal Grandmother    Heart attack Maternal Grandmother    Alzheimer's disease Maternal Grandmother    Emphysema Maternal Grandfather    Breast cancer Paternal Grandmother    Heart disease Paternal Grandfather    Heart attack Paternal Grandfather     Social History Social History   Tobacco Use   Smoking status: Never   Smokeless tobacco: Never  Vaping Use   Vaping Use: Never used  Substance Use Topics   Alcohol use: No   Drug use: No     Allergies   Citalopram   Review of Systems Review of Systems  Constitutional:  Positive for fever.  HENT:  Negative for congestion, ear pain, rhinorrhea and sore throat.   Respiratory:  Positive for cough, shortness of breath and wheezing.      Physical Exam Triage Vital Signs ED Triage Vitals  Enc Vitals Group     BP 08/18/22 1352 (!) 137/92     Pulse Rate 08/18/22 1352 84     Resp 08/18/22 1352 14     Temp 08/18/22 1352 98.6 F (37 C)     Temp Source 08/18/22 1352 Oral     SpO2 08/18/22 1352 100 %     Weight 08/18/22 1348 210 lb (95.3 kg)     Height 08/18/22 1348 5\' 3"  (1.6 m)     Head Circumference --      Peak Flow --      Pain Score 08/18/22 1347 7     Pain Loc --      Pain Edu? --      Excl. in GC? --    No data found.  Updated Vital Signs BP (!) 137/92 (BP Location: Left Arm)   Pulse 84   Temp 98.6 F (37 C) (Oral)   Resp 14   Ht 5\' 3"  (1.6 m)   Wt 210 lb (95.3 kg)   LMP 07/21/2022 (Approximate)   SpO2 100%   Breastfeeding No    BMI 37.20 kg/m   Visual Acuity Right Eye Distance:   Left Eye Distance:   Bilateral Distance:    Right Eye Near:   Left Eye Near:    Bilateral Near:     Physical Exam Vitals and nursing note reviewed.  Constitutional:      Appearance: Normal appearance. She is not ill-appearing.  HENT:     Head: Normocephalic and atraumatic.     Right Ear: Tympanic membrane, ear canal and external ear normal. There is no impacted cerumen.     Left Ear: Tympanic membrane, ear canal and external ear normal. There is no impacted cerumen.     Nose: Nose normal. No congestion or rhinorrhea.     Mouth/Throat:     Mouth: Mucous membranes are moist.     Pharynx: Oropharynx is clear. No oropharyngeal exudate or posterior oropharyngeal erythema.  Cardiovascular:     Rate and Rhythm: Normal rate and regular rhythm.     Pulses: Normal pulses.     Heart sounds: Normal heart sounds. No murmur heard.    No friction rub. No gallop.  Pulmonary:     Effort: Pulmonary effort is normal.     Breath sounds: Wheezing present. No rhonchi or rales.     Comments: Patient has wheezes present in all lung fields when she coughs but otherwise her lung sounds are clear to auscultation. Skin:    General: Skin is warm and dry.  Capillary Refill: Capillary refill takes less than 2 seconds.     Findings: No erythema or rash.  Neurological:     General: No focal deficit present.     Mental Status: She is alert and oriented to person, place, and time.  Psychiatric:        Mood and Affect: Mood normal.        Behavior: Behavior normal.        Thought Content: Thought content normal.        Judgment: Judgment normal.      UC Treatments / Results  Labs (all labs ordered are listed, but only abnormal results are displayed) Labs Reviewed - No data to display  EKG Normal sinus rhythm with a ventricular rate of 79 bpm PR interval 132 ms QRS duration 80 ms QT/QTc 378/4 and 33 ms No ST or T wave  abnormalities.   Radiology DG Chest 2 View  Result Date: 08/18/2022 CLINICAL DATA:  Cough for 4 days EXAM: CHEST - 2 VIEW COMPARISON:  Chest x-ray December 23, 2018 FINDINGS: The cardiomediastinal silhouette is unchanged in contour. No focal pulmonary opacity. No pleural effusion or pneumothorax. Surgical clips in the right upper quadrant. No acute osseous abnormality. IMPRESSION: No active cardiopulmonary disease. Electronically Signed   By: Jacob Moores M.D.   On: 08/18/2022 14:26    Procedures Procedures (including critical care time)  Medications Ordered in UC Medications - No data to display  Initial Impression / Assessment and Plan / UC Course  I have reviewed the triage vital signs and the nursing notes.  Pertinent labs & imaging results that were available during my care of the patient were reviewed by me and considered in my medical decision making (see chart for details).   Patient is a nontoxic-appearing 46 year old female here for evaluation of 4 days worth of cough, shortness breath, and wheezing with intermittent fevers.  She also states that when she coughs she has burning in her upper chest at other times has a sensation that she cannot describe and her chest when asked specifically if it was pain she stated that she could not describe the feeling she is having.  She does report that she has had an off-and-on fever with a Tmax 100 over the past 4 days.  In the last day she has been begun to have sputum production in the morning or in the evening and is typically yellow though she did see some blood streaks in her sputum 1 time.  Patient is able speak in complete sentences and she is not having any dyspnea or tachypnea.  She is in no acute distress.  No evidence of hypoxia and she is afebrile here in clinic.  Patient's upper respiratory tree does not show any signs of inflammation or mucus production.  She does have wheezes that are present only when she coughs but otherwise her  lung sounds are clear to auscultation all fields.  I suspect patient has developing bronchitis.  I will order an EKG due to the discomfort she is feeling in her chest as well as a chest x-ray.  EKG shows normal sinus rhythm without any T wave or ST abnormalities.  There is no significant change when compared to EKG from 12/28/2018.  Radiology impression of chest x-ray states no active cardiopulmonary disease.  I will discharge patient with a diagnosis of bronchitis and will prescribe Xopenex inhaler, prednisone, Tessalon Perles, and Promethazine DM cough syrup.  Final Clinical Impressions(s) / UC Diagnoses  Final diagnoses:  Bronchitis     Discharge Instructions      Use the Xopenex inhaler every 4-6 hours as needed for shortness of breath, wheezing, and cough.  Take the prednisone according to the package instructions to help with pulmonary inflammation.  Use the Tessalon Perles every 8 hours for your cough.  Taken with a small sip of water.  They may give you some numbness to the base of your tongue or metallic taste in her mouth, this is normal.  They are designed to calm down the cough reflex.  Use the Promethazine DM cough syrup at bedtime as will make you drowsy.  You may take 1 teaspoon (5 mL) every 6 hours.  Return for reevaluation for new or worsening symptoms.      ED Prescriptions     Medication Sig Dispense Auth. Provider   levalbuterol [facility] HFA) 45 MCG/ACT inhaler Inhale 1-2 puffs into the lungs every 6 (six) hours as needed for wheezing. 45 g Margarette Canada, NP   Spacer/Aero-Holding Chambers (AEROCHAMBER MV) inhaler Use as instructed 1 each Margarette Canada, NP   benzonatate (TESSALON) 100 MG capsule Take 2 capsules (200 mg total) by mouth every 8 (eight) hours. 21 capsule Margarette Canada, NP   predniSONE (DELTASONE) 20 MG tablet Take 3 tablets (60 mg total) by mouth daily with breakfast for 5 days. 3 tablets daily for 5 days. 15 tablet Margarette Canada, NP    promethazine-dextromethorphan (PROMETHAZINE-DM) 6.25-15 MG/5ML syrup Take 5 mLs by mouth 4 (four) times daily as needed. 118 mL Margarette Canada, NP      PDMP not reviewed this encounter.   Margarette Canada, NP 08/18/22 1436

## 2022-08-18 NOTE — Discharge Instructions (Addendum)
Use the Xopenex inhaler every 4-6 hours as needed for shortness of breath, wheezing, and cough.  Take the prednisone according to the package instructions to help with pulmonary inflammation.  Use the Tessalon Perles every 8 hours for your cough.  Taken with a small sip of water.  They may give you some numbness to the base of your tongue or metallic taste in her mouth, this is normal.  They are designed to calm down the cough reflex.  Use the Promethazine DM cough syrup at bedtime as will make you drowsy.  You may take 1 teaspoon (5 mL) every 6 hours.  Return for reevaluation for new or worsening symptoms.

## 2022-08-18 NOTE — ED Triage Notes (Signed)
Patient c/o cough, chest congestion that started on Wed.  Patient reports SOB recently.  Patient reports chills.  Patient unsure of fevers. Patient took home covid test on Friday and today and both were negative.

## 2022-08-29 ENCOUNTER — Other Ambulatory Visit: Payer: Self-pay | Admitting: Infectious Diseases

## 2022-08-29 MED ORDER — AMOXICILLIN-POT CLAVULANATE 875-125 MG PO TABS: 1.0000 | ORAL_TABLET | Freq: Two times a day (BID) | ORAL | 0 refills | Status: DC

## 2022-09-23 ENCOUNTER — Ambulatory Visit: Payer: BC Managed Care – PPO | Admitting: Internal Medicine

## 2022-12-05 ENCOUNTER — Ambulatory Visit: Payer: BC Managed Care – PPO | Admitting: Internal Medicine

## 2022-12-05 VITALS — BP 132/74 | HR 83 | Temp 97.8°F | Resp 16 | Ht 63.0 in | Wt 213.0 lb

## 2022-12-05 DIAGNOSIS — Z1211 Encounter for screening for malignant neoplasm of colon: Secondary | ICD-10-CM

## 2022-12-05 DIAGNOSIS — F419 Anxiety disorder, unspecified: Secondary | ICD-10-CM

## 2022-12-05 DIAGNOSIS — K219 Gastro-esophageal reflux disease without esophagitis: Secondary | ICD-10-CM | POA: Diagnosis not present

## 2022-12-05 DIAGNOSIS — J309 Allergic rhinitis, unspecified: Secondary | ICD-10-CM | POA: Diagnosis not present

## 2022-12-05 DIAGNOSIS — J4520 Mild intermittent asthma, uncomplicated: Secondary | ICD-10-CM

## 2022-12-05 DIAGNOSIS — E7800 Pure hypercholesterolemia, unspecified: Secondary | ICD-10-CM

## 2022-12-05 DIAGNOSIS — G43019 Migraine without aura, intractable, without status migrainosus: Secondary | ICD-10-CM

## 2022-12-05 NOTE — Progress Notes (Signed)
Subjective:    Patient ID: Carrie [name], female    DOB: 02/11/1976, 47 y.o.   MRN: [id number]  Patient here for  Chief Complaint  Patient presents with   Medical Management of Chronic Issues    HPI Here to follow up regarding increased stress and reflux.  Sees Dr Nicolasa Ducking.  On trintellix now.  Work stress is better.  Has more flexibility.  No chest pain.  Breathing stable.  No increased cough or congestion.  No abdominal pain or bowel change reported.  Period today.  Discussed pap and f/u with gyn.    Past Medical History:  Diagnosis Date   Abnormal Pap smear    Allergy    Arthritis    t-12   Asthma    exercised induced   Depression    GERD (gastroesophageal reflux disease)    Headache(784.0)    History of sensory changes 08/09/2020   Mononucleosis 1998   PONV (postoperative nausea and vomiting)    takes awhile to wake up   Past Surgical History:  Procedure Laterality Date   CARPAL TUNNEL RELEASE Bilateral 2016   CESAREAN SECTION N/A 10/30/2013   Procedure: CESAREAN SECTION;  Surgeon: Margarette Asal, MD;  Location: [place] [facility];  Service: Obstetrics;  Laterality: N/A;  PRIMARY   CHOLECYSTECTOMY N/A 10/24/2017   Procedure: LAPAROSCOPIC CHOLECYSTECTOMY;  Surgeon: Florene Glen, MD;  Location: [facility];  Service: General;  Laterality: N/A;   COLPOSCOPY W/ BIOPSY / CURETTAGE     DE QUERVAIN'S RELEASE Left 2016   DILATION AND CURETTAGE OF UTERUS     FOOT FRACTURE SURGERY Left    HAND SURGERY Left    Fracture repair   IVF     Family History  Problem Relation Age of Onset   Asthma Mother    Arthritis Mother    Diverticulitis Mother    Thyroid disease Mother    GER disease Mother    Migraines Mother    Arthritis Father    Hypertension Father    Hyperlipidemia Father    Thyroid disease Father    Allergic rhinitis Sister    Anemia Sister    Thyroid disease Sister    Thyroid disease Sister    Bipolar disorder Sister    Arrhythmia Sister        Ventricular  Tachycardia   Healthy Brother    Diabetes Maternal Grandmother    Heart attack Maternal Grandmother    Alzheimer's disease Maternal Grandmother    Emphysema Maternal Grandfather    Breast cancer Paternal Grandmother    Heart disease Paternal Grandfather    Heart attack Paternal Grandfather    Social History   Socioeconomic History   Marital status: Married    Spouse name: Not on file   Number of children: 1   Years of education: Master's   Highest education level: Not on file  Occupational History   Occupation: Passenger transport manager    Comment: Transport planner  Tobacco Use   Smoking status: Never   Smokeless tobacco: Never  Vaping Use   Vaping Use: Never used  Substance and Sexual Activity   Alcohol use: No   Drug use: No   Sexual activity: Not Currently    Comment: couple is afraid  to mess something up  Other Topics Concern   Not on file  Social History Narrative   Not on file   Social Determinants of Health   Financial Resource Strain: Low Risk  (10/17/2021)   Overall Emergency planning/management officer Strain (  CARDIA)    Difficulty of Paying Living Expenses: Not hard at all  Food Insecurity: Not on file  Transportation Needs: Not on file  Physical Activity: Not on file  Stress: Not on file  Social Connections: Not on file     Review of Systems  Constitutional:  Negative for appetite change and unexpected weight change.  HENT:  Negative for congestion and sinus pressure.   Respiratory:  Negative for cough, chest tightness and shortness of breath.   Cardiovascular:  Negative for chest pain, palpitations and leg swelling.  Gastrointestinal:  Negative for abdominal pain, diarrhea, nausea and vomiting.  Genitourinary:  Negative for difficulty urinating and dysuria.  Musculoskeletal:  Negative for joint swelling and myalgias.  Skin:  Negative for color change and rash.  Neurological:  Negative for dizziness.  Psychiatric/Behavioral:  Negative for agitation and dysphoric mood.         Objective:     BP 132/74   Pulse 83   Temp 97.8 F (36.6 C)   Resp 16   Ht 5\' 3"  (1.6 m)   Wt 213 lb (96.6 kg)   SpO2 98%   BMI 37.73 kg/m  Wt Readings from Last 3 Encounters:  12/05/22 213 lb (96.6 kg)  08/18/22 210 lb (95.3 kg)  07/08/22 212 lb (96.2 kg)    Physical Exam Vitals reviewed.  Constitutional:      General: She is not in acute distress.    Appearance: Normal appearance.  HENT:     Head: Normocephalic and atraumatic.     Right Ear: External ear normal.     Left Ear: External ear normal.  Eyes:     General: No scleral icterus.       Right eye: No discharge.        Left eye: No discharge.     Conjunctiva/sclera: Conjunctivae normal.  Neck:     Thyroid: No thyromegaly.  Cardiovascular:     Rate and Rhythm: Normal rate and regular rhythm.  Pulmonary:     Effort: No respiratory distress.     Breath sounds: Normal breath sounds. No wheezing.  Abdominal:     General: Bowel sounds are normal.     Palpations: Abdomen is soft.     Tenderness: There is no abdominal tenderness.  Musculoskeletal:        General: No swelling or tenderness.     Cervical back: Neck supple. No tenderness.  Lymphadenopathy:     Cervical: No cervical adenopathy.  Skin:    Findings: No erythema or rash.  Neurological:     Mental Status: She is alert.  Psychiatric:        Mood and Affect: Mood normal.        Behavior: Behavior normal.      Outpatient Encounter Medications as of 12/05/2022  Medication Sig   acetaminophen (TYLENOL) 500 MG tablet Take 1,000-1,500 mg by mouth every 6 (six) hours as needed for moderate pain or headache.    CALCIUM PO Take 1 tablet by mouth daily.   dexmethylphenidate (FOCALIN XR) 20 MG 24 hr capsule    famotidine (PEPCID) 10 MG tablet Take 10 mg by mouth at bedtime. PRN at bedtime.   fluticasone (FLONASE) 50 MCG/ACT nasal spray PLACE 1 SPRAY INTO BOTH NOSTRILS AT BEDTIME.   Fremanezumab-vfrm (AJOVY) 225 MG/1.5ML SOAJ Inject 225 mg into  the skin every 30 (thirty) days.   ibuprofen (ADVIL,MOTRIN) 200 MG tablet Take 600 mg by mouth every 6 (six) hours as needed for headache  or moderate pain.   levalbuterol (XOPENEX HFA) 45 MCG/ACT inhaler Inhale 1-2 puffs into the lungs every 6 (six) hours as needed for wheezing.   magnesium oxide (MAG-OX) 400 (241.3 Mg) MG tablet TAKE 1 TABLET EVERY DAY   Omega-3 Fatty Acids (FISH OIL BURP-LESS PO) Take 1 capsule by mouth daily.    pantoprazole (PROTONIX) 40 MG tablet TAKE 1 TABLET BY MOUTH EVERY DAY   propranolol (INDERAL) 20 MG tablet Take 1 tablet (20 mg total) by mouth 3 (three) times daily as needed.   Spacer/Aero-Holding Chambers (AEROCHAMBER MV) inhaler Use as instructed   SUMAtriptan (IMITREX) 100 MG tablet Take 100 mg by mouth as needed.   TRINTELLIX 10 MG TABS tablet Take by mouth.   Ubrogepant (UBRELVY) 100 MG TABS Take by mouth.   [DISCONTINUED] amoxicillin-clavulanate (AUGMENTIN) 875-125 MG tablet Take 1 tablet by mouth 2 (two) times daily.   [DISCONTINUED] benzonatate (TESSALON) 100 MG capsule Take 2 capsules (200 mg total) by mouth every 8 (eight) hours.   [DISCONTINUED] promethazine-dextromethorphan (PROMETHAZINE-DM) 6.25-15 MG/5ML syrup Take 5 mLs by mouth 4 (four) times daily as needed.   No facility-administered encounter medications on file as of 12/05/2022.     Lab Results  Component Value Date   WBC 5.8 12/06/2022   HGB 12.2 12/06/2022   HCT 36.7 12/06/2022   PLT 370.0 12/06/2022   GLUCOSE 90 12/06/2022   CHOL 227 (H) 12/06/2022   TRIG 128.0 12/06/2022   HDL 51.70 12/06/2022   LDLDIRECT 134.0 03/07/2022   LDLCALC 150 (H) 12/06/2022   ALT 12 12/06/2022   AST 15 12/06/2022   NA 137 12/06/2022   K 4.3 12/06/2022   CL 103 12/06/2022   CREATININE 0.66 12/06/2022   BUN 14 12/06/2022   CO2 26 12/06/2022   TSH 1.84 12/06/2022    DG Chest 2 View  Result Date: 08/18/2022 CLINICAL DATA:  Cough for 4 days EXAM: CHEST - 2 VIEW COMPARISON:  Chest x-ray December 23, 2018 FINDINGS: The cardiomediastinal silhouette is unchanged in contour. No focal pulmonary opacity. No pleural effusion or pneumothorax. Surgical clips in the right upper quadrant. No acute osseous abnormality. IMPRESSION: No active cardiopulmonary disease. Electronically Signed   By: Jacob Moores M.D.   On: 08/18/2022 14:26       Assessment & Plan:  Hypercholesteremia Assessment & Plan: Low cholesterol diet and exercise.  Follow lipid panel.   Orders: -     CBC with Differential/Platelet; Future -     Hepatic function panel; Future -     Basic metabolic panel; Future -     Lipid panel; Future -     TSH; Future  Gastroesophageal reflux disease, unspecified whether esophagitis present Assessment & Plan: On protonix.  See previus note.  Was referred to [facility].  Need to follow up getting appt scheduled.  Previously noted - protonix controls.  If skips, return of symptoms.     Allergic rhinitis, unspecified seasonality, unspecified trigger Assessment & Plan: Appears to be controlled on current regimen.     Anxiety Assessment & Plan: Sees Dr Maryruth Bun.  On trintelix.  Work stress is better.  Follow.     Common migraine with intractable migraine Assessment & Plan: S/p SPG block.  Followed by neurology.  Continue ajovy.  Appears to be doing better.  Follow.    Mild intermittent asthma, unspecified whether complicated Assessment & Plan: Breathing overall stable.    Colon cancer screening Assessment & Plan: Due colonoscopy.  F/u with GI referral.  Julisa Flippo, MD 

## 2022-12-06 ENCOUNTER — Other Ambulatory Visit (INDEPENDENT_AMBULATORY_CARE_PROVIDER_SITE_OTHER): Payer: BC Managed Care – PPO

## 2022-12-06 DIAGNOSIS — E7800 Pure hypercholesterolemia, unspecified: Secondary | ICD-10-CM

## 2022-12-06 LAB — CBC WITH DIFFERENTIAL/PLATELET
Basophils Absolute: 0 10*3/uL (ref 0.0–0.1)
Basophils Relative: 0.3 % (ref 0.0–3.0)
Eosinophils Absolute: 0.1 10*3/uL (ref 0.0–0.7)
Eosinophils Relative: 1.3 % (ref 0.0–5.0)
HCT: 36.7 % (ref 36.0–46.0)
Hemoglobin: 12.2 g/dL (ref 12.0–15.0)
Lymphocytes Relative: 39.5 % (ref 12.0–46.0)
Lymphs Abs: 2.3 10*3/uL (ref 0.7–4.0)
MCHC: 33.3 g/dL (ref 30.0–36.0)
MCV: 85.4 fl (ref 78.0–100.0)
Monocytes Absolute: 0.4 10*3/uL (ref 0.1–1.0)
Monocytes Relative: 7.3 % (ref 3.0–12.0)
Neutro Abs: 3 10*3/uL (ref 1.4–7.7)
Neutrophils Relative %: 51.6 % (ref 43.0–77.0)
Platelets: 370 10*3/uL (ref 150.0–400.0)
RBC: 4.29 Mil/uL (ref 3.87–5.11)
RDW: 14.4 % (ref 11.5–15.5)
WBC: 5.8 10*3/uL (ref 4.0–10.5)

## 2022-12-06 LAB — TSH: TSH: 1.84 u[IU]/mL (ref 0.35–5.50)

## 2022-12-06 LAB — BASIC METABOLIC PANEL
BUN: 14 mg/dL (ref 6–23)
CO2: 26 mEq/L (ref 19–32)
Calcium: 9.2 mg/dL (ref 8.4–10.5)
Chloride: 103 mEq/L (ref 96–112)
Creatinine, Ser: 0.66 mg/dL (ref 0.40–1.20)
GFR: 105.58 mL/min (ref >60.00)
Glucose, Bld: 90 mg/dL (ref 70–99)
Potassium: 4.3 mEq/L (ref 3.5–5.1)
Sodium: 137 mEq/L (ref 135–145)

## 2022-12-06 LAB — LIPID PANEL
Cholesterol: 227 mg/dL — ABNORMAL HIGH (ref 0–200)
HDL: 51.7 mg/dL (ref >39.00)
LDL Cholesterol: 150 mg/dL — ABNORMAL HIGH (ref 0–99)
NonHDL: 175.27
Total CHOL/HDL Ratio: 4
Triglycerides: 128 mg/dL (ref 0.0–149.0)
VLDL: 25.6 mg/dL (ref 0.0–40.0)

## 2022-12-06 LAB — HEPATIC FUNCTION PANEL
ALT: 12 U/L (ref 0–35)
AST: 15 U/L (ref 0–37)
Albumin: 4.3 g/dL (ref 3.5–5.2)
Alkaline Phosphatase: 56 U/L (ref 39–117)
Bilirubin, Direct: 0.1 mg/dL (ref 0.0–0.3)
Total Bilirubin: 0.5 mg/dL (ref 0.2–1.2)
Total Protein: 6.9 g/dL (ref 6.0–8.3)

## 2022-12-08 ENCOUNTER — Encounter: Payer: Self-pay | Admitting: Internal Medicine

## 2022-12-08 ENCOUNTER — Telehealth: Payer: Self-pay | Admitting: Internal Medicine

## 2022-12-08 DIAGNOSIS — Z1211 Encounter for screening for malignant neoplasm of colon: Secondary | ICD-10-CM | POA: Insufficient documentation

## 2022-12-08 NOTE — Assessment & Plan Note (Signed)
S/p SPG block.  Followed by neurology.  Continue ajovy.  Appears to be doing better.  Follow.

## 2022-12-08 NOTE — Assessment & Plan Note (Signed)
Low cholesterol diet and exercise.  Follow lipid panel.   

## 2022-12-08 NOTE — Assessment & Plan Note (Signed)
Due colonoscopy.  F/u with GI referral.

## 2022-12-08 NOTE — Assessment & Plan Note (Signed)
Breathing overall stable.

## 2022-12-08 NOTE — Assessment & Plan Note (Addendum)
On protonix.  See previus note.  Was referred to [facility].  Need to follow up getting appt scheduled.  Previously noted - protonix controls.  If skips, return of symptoms.

## 2022-12-08 NOTE — Assessment & Plan Note (Signed)
Appears to be controlled on current regimen.   

## 2022-12-08 NOTE — Assessment & Plan Note (Signed)
Sees Dr Nicolasa Ducking.  On trintelix.  Work stress is better.  Follow.

## 2022-12-08 NOTE — Telephone Encounter (Signed)
She is due screening colonoscopy.  Also with history of reflux on protonix.  I placed an order for a referral to [facility].  She needs an appt with them.  Can you help with this?  Let me know if I need to place order for another referral.  Thank you

## 2022-12-09 NOTE — Telephone Encounter (Signed)
Thank you. I appreciate it. 

## 2022-12-12 ENCOUNTER — Telehealth: Payer: Self-pay

## 2022-12-12 NOTE — Telephone Encounter (Signed)
LM FOR PT TO [facility]    I reviewed your recent lab results and your triglycerides improved from last check.  Total and bad cholesterol increased.  I recommend diet and exercise.  We will follow.  Your hemoglobin, thyroid test, kidney function tests and liver function tests are within normal limits.  Let us know if you need anything.     It was nice seeing you. Dr Nicki Reaper

## 2022-12-16 ENCOUNTER — Other Ambulatory Visit: Payer: Self-pay | Admitting: Internal Medicine

## 2022-12-26 ENCOUNTER — Encounter: Payer: Self-pay | Admitting: Internal Medicine

## 2022-12-26 NOTE — Telephone Encounter (Signed)
Called and discussed with patient. Unsure why she is unable to schedule her own appt. Advised I would try to call [facility] and set up appt for her.

## 2022-12-26 NOTE — Telephone Encounter (Signed)
[facility] with GI, unable to schedule pt until provider at [facility] accepts TOC. They are supposed to reach out to patient after provider approves. Pt is aware.

## 2022-12-27 ENCOUNTER — Telehealth: Payer: Self-pay | Admitting: Gastroenterology

## 2022-12-27 NOTE — Telephone Encounter (Signed)
Ok, please schedule direct colonoscopy if patient has no active GI symptoms.  Thank you

## 2022-12-27 NOTE — Telephone Encounter (Signed)
Good morning Dr. Silverio Decamp,   We received a call from this patient requesting to schedule a colonoscopy. Patient was last seen with [facility] in [place] in November of 2022. She had a endoscopy scheduled but was cancelled. Records from initial office visit are in Epic for your review. Please advise on scheduling.   Thank you.

## 2023-03-07 ENCOUNTER — Encounter: Payer: BC Managed Care – PPO | Admitting: Internal Medicine

## 2023-03-21 ENCOUNTER — Encounter: Payer: Self-pay | Admitting: Gastroenterology

## 2023-03-21 ENCOUNTER — Ambulatory Visit: Payer: BC Managed Care – PPO | Admitting: Gastroenterology

## 2023-03-21 ENCOUNTER — Encounter: Payer: Self-pay | Admitting: Internal Medicine

## 2023-03-21 VITALS — BP 130/88 | HR 80 | Ht 63.0 in | Wt 208.0 lb

## 2023-03-21 DIAGNOSIS — Z1211 Encounter for screening for malignant neoplasm of colon: Secondary | ICD-10-CM

## 2023-03-21 DIAGNOSIS — K219 Gastro-esophageal reflux disease without esophagitis: Secondary | ICD-10-CM | POA: Diagnosis not present

## 2023-03-21 MED ORDER — NA SULFATE-K SULFATE-MG SULF 17.5-3.13-1.6 GM/177ML PO SOLN: ORAL | 0 refills | Status: DC

## 2023-03-21 NOTE — Patient Instructions (Signed)
You have been scheduled for an endoscopy and colonoscopy. Please follow the written instructions given to you at your visit today. Please pick up your prep supplies at the pharmacy within the next 1-3 days. If you use inhalers (even only as needed), please bring them with you on the day of your procedure.   Gastroesophageal Reflux Disease, Adult Gastroesophageal reflux (GER) happens when acid from the stomach flows up into the tube that connects the mouth and the stomach (esophagus). Normally, food travels down the esophagus and stays in the stomach to be digested. However, when a person has GER, food and stomach acid sometimes move back up into the esophagus. If this becomes a more serious problem, the person may be diagnosed with a disease called gastroesophageal reflux disease (GERD). GERD occurs when the reflux: Happens often. Causes frequent or severe symptoms. Causes problems such as damage to the esophagus. When stomach acid comes in contact with the esophagus, the acid may cause inflammation in the esophagus. Over time, GERD may create small holes (ulcers) in the lining of the esophagus. What are the causes? This condition is caused by a problem with the muscle between the esophagus and the stomach (lower esophageal sphincter, or LES). Normally, the LES muscle closes after food passes through the esophagus to the stomach. When the LES is weakened or abnormal, it does not close properly, and that allows food and stomach acid to go back up into the esophagus. The LES can be weakened by certain dietary substances, medicines, and medical conditions, including: Tobacco use. Pregnancy. Having a hiatal hernia. Alcohol use. Certain foods and beverages, such as coffee, chocolate, onions, and peppermint. What increases the risk? You are more likely to develop this condition if you: Have an increased body weight. Have a connective tissue disorder. Take NSAIDs, such as ibuprofen. What are the  signs or symptoms? Symptoms of this condition include: Heartburn. Difficult or painful swallowing and the feeling of having a lump in the throat. A bitter taste in the mouth. Bad breath and having a large amount of saliva. Having an upset or bloated stomach and belching. Chest pain. Different conditions can cause chest pain. Make sure you see your health care provider if you experience chest pain. Shortness of breath or wheezing. Ongoing (chronic) cough or a nighttime cough. Wearing away of tooth enamel. Weight loss. How is this diagnosed? This condition may be diagnosed based on a medical history and a physical exam. To determine if you have mild or severe GERD, your health care provider may also monitor how you respond to treatment. You may also have tests, including: A test to examine your stomach and esophagus with a small camera (endoscopy). A test that measures the acidity level in your esophagus. A test that measures how much pressure is on your esophagus. A barium swallow or modified barium swallow test to show the shape, size, and functioning of your esophagus. How is this treated? Treatment for this condition may vary depending on how severe your symptoms are. Your health care provider may recommend: Changes to your diet. Medicine. Surgery. The goal of treatment is to help relieve your symptoms and to prevent complications. Follow these instructions at home: Eating and drinking  Follow a diet as recommended by your health care provider. This may involve avoiding foods and drinks such as: Coffee and tea, with or without caffeine. Drinks that contain alcohol. Energy drinks and sports drinks. Carbonated drinks or sodas. Chocolate and cocoa. Peppermint and mint flavorings. Garlic and onions.  Horseradish. Spicy and acidic foods, including peppers, chili powder, curry powder, vinegar, hot sauces, and barbecue sauce. Citrus fruit juices and citrus fruits, such as oranges,  lemons, and limes. Tomato-based foods, such as red sauce, chili, salsa, and pizza with red sauce. Fried and fatty foods, such as donuts, french fries, potato chips, and high-fat dressings. High-fat meats, such as hot dogs and fatty cuts of red and white meats, such as rib eye steak, sausage, ham, and bacon. High-fat dairy items, such as whole milk, butter, and cream cheese. Eat small, frequent meals instead of large meals. Avoid drinking large amounts of liquid with your meals. Avoid eating meals during the 2-3 hours before bedtime. Avoid lying down right after you eat. Do not exercise right after you eat. Lifestyle  Do not use any products that contain nicotine or tobacco. These products include cigarettes, chewing tobacco, and vaping devices, such as e-cigarettes. If you need help quitting, ask your health care provider. Try to reduce your stress by using methods such as yoga or meditation. If you need help reducing stress, ask your health care provider. If you are overweight, reduce your weight to an amount that is healthy for you. Ask your health care provider for guidance about a safe weight loss goal. General instructions Pay attention to any changes in your symptoms. Take over-the-counter and prescription medicines only as told by your health care provider. Do not take aspirin, ibuprofen, or other NSAIDs unless your health care provider told you to take these medicines. Wear loose-fitting clothing. Do not wear anything tight around your waist that causes pressure on your abdomen. Raise (elevate) the head of your bed about 6 inches (15 cm). You can use a wedge to do this. Avoid bending over if this makes your symptoms worse. Keep all follow-up visits. This is important. Contact a health care provider if: You have: New symptoms. Unexplained weight loss. Difficulty swallowing or it hurts to swallow. Wheezing or a persistent cough. A hoarse voice. Your symptoms do not improve with  treatment. Get help right away if: You have sudden pain in your arms, neck, jaw, teeth, or back. You suddenly feel sweaty, dizzy, or light-headed. You have chest pain or shortness of breath. You vomit and the vomit is green, yellow, or black, or it looks like blood or coffee grounds. You faint. You have stool that is red, bloody, or black. You cannot swallow, drink, or eat. These symptoms may represent a serious problem that is an emergency. Do not wait to see if the symptoms will go away. Get medical help right away. Call your local emergency services (911 in the [place] Do not drive yourself to the hospital. Summary Gastroesophageal reflux happens when acid from the stomach flows up into the esophagus. GERD is a disease in which the reflux happens often, causes frequent or severe symptoms, or causes problems such as damage to the esophagus. Treatment for this condition may vary depending on how severe your symptoms are. Your health care provider may recommend diet and lifestyle changes, medicine, or surgery. Contact a health care provider if you have new or worsening symptoms. Take over-the-counter and prescription medicines only as told by your health care provider. Do not take aspirin, ibuprofen, or other NSAIDs unless your health care provider told you to do so. Keep all follow-up visits as told by your health care provider. This is important. This information is not intended to replace advice given to you by your health care provider. Make sure you discuss any  questions you have with your health care provider. Document Revised: 05/08/2020 Document Reviewed: 05/08/2020 Elsevier Patient Education  2023 [employer].  Due to recent changes in healthcare laws, you may see the results of your imaging and laboratory studies on MyChart before your provider has had a chance to review them.  We understand that in some cases there may be results that are confusing or concerning to you. Not all  laboratory results come back in the same time frame and the provider may be waiting for multiple results in order to interpret others.  Please give [place] 48 hours in order for your provider to thoroughly review all the results before contacting the office for clarification of your results.    _______________________________________________________  If your blood pressure at your visit was 140/90 or greater, please contact your primary care physician to follow up on this.  _______________________________________________________  If you are age 72 or older, your body mass index should be between 23-30. Your Body mass index is 36.85 kg/m. If this is out of the aforementioned range listed, please consider follow up with your Primary Care Provider.  If you are age 23 or younger, your body mass index should be between 19-25. Your Body mass index is 36.85 kg/m. If this is out of the aformentioned range listed, please consider follow up with your Primary Care Provider.   ________________________________________________________  The [facility] providers would like to encourage you to use [facility] to communicate with providers for non-urgent requests or questions.  Due to long hold times on the telephone, sending your provider a message by [facility] may be a faster and more efficient way to get a response.  Please allow 48 business hours for a response.  Please remember that this is for non-urgent requests.  _______________________________________________________   I appreciate the  opportunity to care for you  Thank You   Marsa Aris , MD

## 2023-03-21 NOTE — Progress Notes (Signed)
Carrie [name]    [id number]    July 18, 1976  Primary Care Physician:[name], Westley Hummer, MD  Referring Physician: Dale [place], MD [address] [place],  [place] [address]   Chief complaint: GERD  HPI:  47 year old very pleasant female with history of chronic GERD, she has persistent acid reflux symptoms despite following antireflux measures and taking PPI daily.  She also takes famotidine in the evening   GERD since teenager, acid reflux symptoms are worse when skips taking meds  Since she started [facility], GERD symptoms were worse associated with nausea, vomiting and has stopped it over a year ago.  She is not planning to go back until her GI symptoms improve  No more vomiting.  Denies any dysphagia, odynophagia, abdominal pain, irregular bowel habits, melena or rectal bleeding.  She underwent EGD and colonoscopy in 1998, report not available  S/p cholecystectomy 2018 for cholelithiasis  ROS positive for anxiety, arthritis, chronic low back pain, and chronic headaches  Outpatient Encounter Medications as of 03/21/2023  Medication Sig   acetaminophen (TYLENOL) 500 MG tablet Take 1,000-1,500 mg by mouth every 6 (six) hours as needed for moderate pain or headache.    CALCIUM PO Take 1 tablet by mouth daily.   dexmethylphenidate (FOCALIN XR) 20 MG 24 hr capsule    famotidine (PEPCID) 10 MG tablet Take 10 mg by mouth at bedtime. Only in the morning.   fluticasone (FLONASE) 50 MCG/ACT nasal spray PLACE 1 SPRAY INTO BOTH NOSTRILS AT BEDTIME.   Fremanezumab-vfrm (AJOVY) 225 MG/1.5ML SOAJ Inject 225 mg into the skin every 30 (thirty) days.   ibuprofen (ADVIL,MOTRIN) 200 MG tablet Take 600 mg by mouth every 6 (six) hours as needed for headache or moderate pain.   levalbuterol (XOPENEX HFA) 45 MCG/ACT inhaler Inhale 1-2 puffs into the lungs every 6 (six) hours as needed for wheezing.   magnesium oxide (MAG-OX) 400 (241.3 Mg) MG tablet TAKE 1 TABLET EVERY DAY    Omega-3 Fatty Acids (FISH OIL BURP-LESS PO) Take 1 capsule by mouth daily.    pantoprazole (PROTONIX) 40 MG tablet TAKE 1 TABLET BY MOUTH EVERY DAY   propranolol (INDERAL) 20 MG tablet Take 1 tablet (20 mg total) by mouth 3 (three) times daily as needed.   Spacer/Aero-Holding Chambers (AEROCHAMBER MV) inhaler Use as instructed   SUMAtriptan (IMITREX) 100 MG tablet Take 100 mg by mouth as needed.   TRINTELLIX 10 MG TABS tablet Take by mouth.   Ubrogepant (UBRELVY) 100 MG TABS Take by mouth.   No facility-administered encounter medications on file as of 03/21/2023.    Allergies as of 03/21/2023 - Review Complete 03/21/2023  Allergen Reaction Noted   Citalopram Hives, Itching, and Rash 05/09/2019    Past Medical History:  Diagnosis Date   Abnormal Pap smear    Allergy    Anxiety    Arthritis    t-12   Asthma    exercised induced   Depression    GERD (gastroesophageal reflux disease)    Headache(784.0)    History of sensory changes 08/09/2020   Mononucleosis 1998   PONV (postoperative nausea and vomiting)    takes awhile to wake up   UTI (urinary tract infection)     Past Surgical History:  Procedure Laterality Date   CARPAL TUNNEL RELEASE Bilateral 2016   CESAREAN SECTION N/A 10/30/2013   Procedure: CESAREAN SECTION;  Surgeon: Meriel Pica, MD;  Location: [facility];  Service: Obstetrics;  Laterality:  N/A;  PRIMARY   CHOLECYSTECTOMY N/A 10/24/2017   Procedure: LAPAROSCOPIC CHOLECYSTECTOMY;  Surgeon: Lattie Haw, MD;  Location: [facility] ORS;  Service: General;  Laterality: N/A;   COLPOSCOPY W/ BIOPSY / CURETTAGE     DE QUERVAIN'S RELEASE Left 2016   DILATION AND CURETTAGE OF UTERUS     FOOT FRACTURE SURGERY Left    HAND SURGERY Left    Fracture repair   IVF      Family History  Problem Relation Age of Onset   Asthma Mother    Arthritis Mother    Diverticulitis Mother    Thyroid disease Mother    GER disease Mother    Migraines Mother    Arthritis Father     Hypertension Father    Hyperlipidemia Father    Thyroid disease Father    Allergic rhinitis Sister    Anemia Sister    Thyroid disease Sister    Thyroid disease Sister    Bipolar disorder Sister    Arrhythmia Sister        Ventricular Tachycardia   Healthy Brother    Diabetes Maternal Grandmother    Heart attack Maternal Grandmother    Alzheimer's disease Maternal Grandmother    Emphysema Maternal Grandfather    Breast cancer Paternal Grandmother    Heart disease Paternal Grandfather    Heart attack Paternal Grandfather    Liver disease Neg Hx    Esophageal cancer Neg Hx    Colon cancer Neg Hx     Social History   Socioeconomic History   Marital status: Married    Spouse name: Not on file   Number of children: 1   Years of education: Master's   Highest education level: Not on file  Occupational History   Occupation: Air cabin crew    Comment: Facilities manager  Tobacco Use   Smoking status: Never   Smokeless tobacco: Never  Vaping Use   Vaping Use: Never used  Substance and Sexual Activity   Alcohol use: No   Drug use: No   Sexual activity: Not Currently    Comment: couple is afraid  to mess something up  Other Topics Concern   Not on file  Social History Narrative   Not on file   Social Determinants of Health   Financial Resource Strain: Low Risk  (10/17/2021)   Overall Financial Resource Strain (CARDIA)    Difficulty of Paying Living Expenses: Not hard at all  Food Insecurity: Not on file  Transportation Needs: Not on file  Physical Activity: Not on file  Stress: Not on file  Social Connections: Not on file  Intimate Partner Violence: Not on file      Review of systems: All other review of systems negative except as mentioned in the HPI.   Physical Exam: Vitals:   03/21/23 0844  BP: 130/88  Pulse: 80   Body mass index is 36.85 kg/m. Gen:      No acute distress HEENT:  sclera anicteric Cardiovascular: S1-S2 regular rate and rhythm, no  murmurs Lungs: Clear Abd:      soft, non-tender; no palpable masses, no distension Ext:    No edema Neuro: alert and oriented x 3 Psych: normal mood and affect  Data Reviewed:  Reviewed labs, radiology imaging, old records and pertinent past GI work up   Assessment and Plan/Recommendations:  47 year old very pleasant female with history of chronic GERD with persistent acid reflux symptoms despite daily PPI and H2 blocker Will plan to proceed with EGD  to exclude erosive esophagitis, large hiatal hernia or Barrett's esophagus given longstanding history of GERD Will hold off changing meds until after EGD, may need to consider antireflux procedure/fundoplication  Due for colorectal cancer screening: Average risk Scheduled for colonoscopy along with EGD  The risks and benefits as well as alternatives of endoscopic procedure(s) have been discussed and reviewed. All questions answered. The patient agrees to proceed.  Return after EGD and colonoscopy  The patient was provided an opportunity to ask questions and all were answered. The patient agreed with the plan and demonstrated an understanding of the instructions.  Iona Beard , MD    CC: Dale [place], MD

## 2023-03-27 ENCOUNTER — Ambulatory Visit (INDEPENDENT_AMBULATORY_CARE_PROVIDER_SITE_OTHER): Payer: BC Managed Care – PPO | Admitting: Internal Medicine

## 2023-03-27 VITALS — BP 126/78 | HR 83 | Temp 97.9°F | Resp 16 | Ht 63.0 in | Wt 211.0 lb

## 2023-03-27 DIAGNOSIS — E538 Deficiency of other specified B group vitamins: Secondary | ICD-10-CM | POA: Diagnosis not present

## 2023-03-27 DIAGNOSIS — F419 Anxiety disorder, unspecified: Secondary | ICD-10-CM

## 2023-03-27 DIAGNOSIS — K130 Diseases of lips: Secondary | ICD-10-CM | POA: Diagnosis not present

## 2023-03-27 DIAGNOSIS — E7800 Pure hypercholesterolemia, unspecified: Secondary | ICD-10-CM

## 2023-03-27 DIAGNOSIS — K219 Gastro-esophageal reflux disease without esophagitis: Secondary | ICD-10-CM

## 2023-03-27 DIAGNOSIS — R42 Dizziness and giddiness: Secondary | ICD-10-CM | POA: Diagnosis not present

## 2023-03-27 DIAGNOSIS — J4520 Mild intermittent asthma, uncomplicated: Secondary | ICD-10-CM

## 2023-03-27 DIAGNOSIS — Z1211 Encounter for screening for malignant neoplasm of colon: Secondary | ICD-10-CM

## 2023-03-27 NOTE — Progress Notes (Signed)
Subjective:    Patient ID: Carrie [name], female    DOB: 06-05-1976, 46 y.o.   MRN: [id number]  Patient here for  Chief Complaint  Patient presents with   Annual Exam    HPI Here for a scheduled physical.  Changed to f/u appt.  Planning to see gyn. Has been seeing Dr Maryruth Bun.  Increased stress.  Was started on prozac.  Off now. Did not tolerate - dizziness/vertigo.  Has been off 2-3 weeks.  Back to normal.  Had f/u yesterday with Dr Maryruth Bun.  Breathing overall stable.   No increased cough or congestion reported.  No abdominal pain reported.  Saw GI.  Discussed acid reflux.  Planning for EGD and colonoscopy 05/2023.     Past Medical History:  Diagnosis Date   Abnormal Pap smear    Allergy    Anxiety    Arthritis    t-12   Asthma    exercised induced   Depression    GERD (gastroesophageal reflux disease)    Headache(784.0)    History of sensory changes 08/09/2020   Mononucleosis 1998   PONV (postoperative nausea and vomiting)    takes awhile to wake up   UTI (urinary tract infection)    Past Surgical History:  Procedure Laterality Date   CARPAL TUNNEL RELEASE Bilateral 2016   CESAREAN SECTION N/A 10/30/2013   Procedure: CESAREAN SECTION;  Surgeon: Meriel Pica, MD;  Location: [facility];  Service: Obstetrics;  Laterality: N/A;  PRIMARY   CHOLECYSTECTOMY N/A 10/24/2017   Procedure: LAPAROSCOPIC CHOLECYSTECTOMY;  Surgeon: Lattie Haw, MD;  Location: [facility];  Service: General;  Laterality: N/A;   COLPOSCOPY W/ BIOPSY / CURETTAGE     DE QUERVAIN'S RELEASE Left 2016   DILATION AND CURETTAGE OF UTERUS     FOOT FRACTURE SURGERY Left    HAND SURGERY Left    Fracture repair   IVF     Family History  Problem Relation Age of Onset   Asthma Mother    Arthritis Mother    Diverticulitis Mother    Thyroid disease Mother    GER disease Mother    Migraines Mother    Arthritis Father    Hypertension Father    Hyperlipidemia Father    Thyroid disease Father    Allergic  rhinitis Sister    Anemia Sister    Thyroid disease Sister    Thyroid disease Sister    Bipolar disorder Sister    Arrhythmia Sister        Ventricular Tachycardia   Healthy Brother    Diabetes Maternal Grandmother    Heart attack Maternal Grandmother    Alzheimer's disease Maternal Grandmother    Emphysema Maternal Grandfather    Breast cancer Paternal Grandmother    Heart disease Paternal Grandfather    Heart attack Paternal Grandfather    Liver disease Neg Hx    Esophageal cancer Neg Hx    Colon cancer Neg Hx    Social History   Socioeconomic History   Marital status: Married    Spouse name: Not on file   Number of children: 1   Years of education: Master's   Highest education level: Not on file  Occupational History   Occupation: Air cabin crew    Comment: Facilities manager  Tobacco Use   Smoking status: Never   Smokeless tobacco: Never  Vaping Use   Vaping Use: Never used  Substance and Sexual Activity   Alcohol use: No   Drug use: No  Sexual activity: Not Currently    Comment: couple is afraid  to mess something up  Other Topics Concern   Not on file  Social History Narrative   Not on file   Social Determinants of Health   Financial Resource Strain: Low Risk  (10/17/2021)   Overall Financial Resource Strain (CARDIA)    Difficulty of Paying Living Expenses: Not hard at all  Food Insecurity: Not on file  Transportation Needs: Not on file  Physical Activity: Not on file  Stress: Not on file  Social Connections: Not on file     Review of Systems  Constitutional:  Negative for appetite change and unexpected weight change.  HENT:  Negative for congestion and sinus pressure.   Respiratory:  Negative for cough, chest tightness and shortness of breath.   Cardiovascular:  Negative for chest pain and palpitations.  Gastrointestinal:  Negative for abdominal pain, diarrhea and vomiting.  Genitourinary:  Negative for difficulty urinating and dysuria.   Musculoskeletal:  Negative for joint swelling and myalgias.  Skin:  Negative for color change and rash.  Neurological:        Previous dizziness as outlined.   Psychiatric/Behavioral:  Negative for agitation and dysphoric mood.        Objective:     BP 126/78   Pulse 83   Temp 97.9 F (36.6 C)   Resp 16   Ht 5\' 3"  (1.6 m)   Wt 211 lb (95.7 kg)   SpO2 99%   BMI 37.38 kg/m  Wt Readings from Last 3 Encounters:  03/27/23 211 lb (95.7 kg)  03/21/23 208 lb (94.3 kg)  12/05/22 213 lb (96.6 kg)    Physical Exam Vitals reviewed.  Constitutional:      General: She is not in acute distress.    Appearance: Normal appearance.  HENT:     Head: Normocephalic and atraumatic.     Right Ear: External ear normal.     Left Ear: External ear normal.  Eyes:     General: No scleral icterus.       Right eye: No discharge.        Left eye: No discharge.     Conjunctiva/sclera: Conjunctivae normal.  Neck:     Thyroid: No thyromegaly.  Cardiovascular:     Rate and Rhythm: Normal rate and regular rhythm.  Pulmonary:     Effort: No respiratory distress.     Breath sounds: Normal breath sounds. No wheezing.  Abdominal:     General: Bowel sounds are normal.     Palpations: Abdomen is soft.     Tenderness: There is no abdominal tenderness.  Musculoskeletal:        General: No swelling or tenderness.     Cervical back: Neck supple. No tenderness.  Lymphadenopathy:     Cervical: No cervical adenopathy.  Skin:    Findings: No erythema or rash.  Neurological:     Mental Status: She is alert.  Psychiatric:        Mood and Affect: Mood normal.        Behavior: Behavior normal.      Outpatient Encounter Medications as of 03/27/2023  Medication Sig   acetaminophen (TYLENOL) 500 MG tablet Take 1,000-1,500 mg by mouth every 6 (six) hours as needed for moderate pain or headache.    CALCIUM PO Take 1 tablet by mouth daily.   dexmethylphenidate (FOCALIN XR) 20 MG 24 hr capsule     famotidine (PEPCID) 10 MG tablet Take 10 mg  by mouth at bedtime. Only in the morning.   fluticasone (FLONASE) 50 MCG/ACT nasal spray PLACE 1 SPRAY INTO BOTH NOSTRILS AT BEDTIME.   Fremanezumab-vfrm (AJOVY) 225 MG/1.5ML SOAJ Inject 225 mg into the skin every 30 (thirty) days.   ibuprofen (ADVIL,MOTRIN) 200 MG tablet Take 600 mg by mouth every 6 (six) hours as needed for headache or moderate pain.   levalbuterol (XOPENEX HFA) 45 MCG/ACT inhaler Inhale 1-2 puffs into the lungs every 6 (six) hours as needed for wheezing.   magnesium oxide (MAG-OX) 400 (241.3 Mg) MG tablet TAKE 1 TABLET EVERY DAY   Na Sulfate-K Sulfate-Mg Sulf 17.5-3.13-1.6 GM/177ML SOLN As directed by GI office   Omega-3 Fatty Acids (FISH OIL BURP-LESS PO) Take 1 capsule by mouth daily.    pantoprazole (PROTONIX) 40 MG tablet TAKE 1 TABLET BY MOUTH EVERY DAY   propranolol (INDERAL) 20 MG tablet Take 1 tablet (20 mg total) by mouth 3 (three) times daily as needed.   Spacer/Aero-Holding Chambers (AEROCHAMBER MV) inhaler Use as instructed   SUMAtriptan (IMITREX) 100 MG tablet Take 100 mg by mouth as needed.   TRINTELLIX 10 MG TABS tablet Take by mouth.   Ubrogepant (UBRELVY) 100 MG TABS Take by mouth.   No facility-administered encounter medications on file as of 03/27/2023.     Lab Results  Component Value Date   WBC 7.7 03/27/2023   HGB 12.7 03/27/2023   HCT 38.2 03/27/2023   PLT 418.0 (H) 03/27/2023   GLUCOSE 90 12/06/2022   CHOL 227 (H) 12/06/2022   TRIG 128.0 12/06/2022   HDL 51.70 12/06/2022   LDLDIRECT 134.0 03/07/2022   LDLCALC 150 (H) 12/06/2022   ALT 12 12/06/2022   AST 15 12/06/2022   NA 137 12/06/2022   K 4.3 12/06/2022   CL 103 12/06/2022   CREATININE 0.66 12/06/2022   BUN 14 12/06/2022   CO2 26 12/06/2022   TSH 1.84 12/06/2022    DG Chest 2 View  Result Date: 08/18/2022 CLINICAL DATA:  Cough for 4 days EXAM: CHEST - 2 VIEW COMPARISON:  Chest x-ray December 23, 2018 FINDINGS: The cardiomediastinal  silhouette is unchanged in contour. No focal pulmonary opacity. No pleural effusion or pneumothorax. Surgical clips in the right upper quadrant. No acute osseous abnormality. IMPRESSION: No active cardiopulmonary disease. Electronically Signed   By: Jacob Moores M.D.   On: 08/18/2022 14:26       Assessment & Plan:  B12 deficiency -     Vitamin B12  Cracked lips Assessment & Plan: Check cbc and iron studies.    Orders: -     IBC + Ferritin -     CBC with Differential/Platelet  Anxiety Assessment & Plan: Sees Dr Maryruth Bun.  On trintelix.  Follow.     Colon cancer screening Assessment & Plan: Saw GI as outlined.  Planning for colonoscopy 05/2023.    Dizziness Assessment & Plan: Better since off prozac.    Gastroesophageal reflux disease, unspecified whether esophagitis present Assessment & Plan: On protonix.  Taking pepcid pm.  Saw GI. Planning for EGD 05/2023.    Hypercholesteremia Assessment & Plan: Low cholesterol diet and exercise.  Follow lipid panel.    Mild intermittent asthma, unspecified whether complicated Assessment & Plan: Breathing overall stable.       Dale Benson, MD

## 2023-03-28 LAB — CBC WITH DIFFERENTIAL/PLATELET
Basophils Absolute: 0.1 10*3/uL (ref 0.0–0.1)
Basophils Relative: 0.7 % (ref 0.0–3.0)
Eosinophils Absolute: 0.1 10*3/uL (ref 0.0–0.7)
Eosinophils Relative: 1.2 % (ref 0.0–5.0)
HCT: 38.2 % (ref 36.0–46.0)
Hemoglobin: 12.7 g/dL (ref 12.0–15.0)
Lymphocytes Relative: 34.3 % (ref 12.0–46.0)
Lymphs Abs: 2.7 10*3/uL (ref 0.7–4.0)
MCHC: 33.3 g/dL (ref 30.0–36.0)
MCV: 86.2 fl (ref 78.0–100.0)
Monocytes Absolute: 0.5 10*3/uL (ref 0.1–1.0)
Monocytes Relative: 7 % (ref 3.0–12.0)
Neutro Abs: 4.4 10*3/uL (ref 1.4–7.7)
Neutrophils Relative %: 56.8 % (ref 43.0–77.0)
Platelets: 418 10*3/uL — ABNORMAL HIGH (ref 150.0–400.0)
RBC: 4.43 Mil/uL (ref 3.87–5.11)
RDW: 14.9 % (ref 11.5–15.5)
WBC: 7.7 10*3/uL (ref 4.0–10.5)

## 2023-03-28 LAB — IBC + FERRITIN
Ferritin: 5.7 ng/mL — ABNORMAL LOW (ref 10.0–291.0)
Iron: 47 ug/dL (ref 42–145)
Saturation Ratios: 10.1 % — ABNORMAL LOW (ref 20.0–50.0)
TIBC: 466.2 ug/dL — ABNORMAL HIGH (ref 250.0–450.0)
Transferrin: 333 mg/dL (ref 212.0–360.0)

## 2023-03-28 LAB — VITAMIN B12: Vitamin B-12: 433 pg/mL (ref 211–911)

## 2023-03-31 ENCOUNTER — Encounter: Payer: Self-pay | Admitting: Internal Medicine

## 2023-03-31 ENCOUNTER — Other Ambulatory Visit: Payer: Self-pay

## 2023-03-31 DIAGNOSIS — R7989 Other specified abnormal findings of blood chemistry: Secondary | ICD-10-CM

## 2023-03-31 NOTE — Assessment & Plan Note (Signed)
Breathing overall stable.  ?

## 2023-03-31 NOTE — Assessment & Plan Note (Signed)
Better since off prozac.

## 2023-03-31 NOTE — Assessment & Plan Note (Signed)
Saw GI as outlined.  Planning for colonoscopy 05/2023.

## 2023-03-31 NOTE — Assessment & Plan Note (Signed)
Low cholesterol diet and exercise.  Follow lipid panel.   

## 2023-03-31 NOTE — Assessment & Plan Note (Signed)
Sees Dr Maryruth Bun.  On trintelix.  Follow.

## 2023-03-31 NOTE — Assessment & Plan Note (Signed)
On protonix.  Taking pepcid pm.  Saw GI. Planning for EGD 05/2023.

## 2023-03-31 NOTE — Assessment & Plan Note (Signed)
Check cbc and iron studies.   

## 2023-04-28 ENCOUNTER — Other Ambulatory Visit (INDEPENDENT_AMBULATORY_CARE_PROVIDER_SITE_OTHER): Payer: BC Managed Care – PPO

## 2023-04-28 DIAGNOSIS — R7989 Other specified abnormal findings of blood chemistry: Secondary | ICD-10-CM | POA: Diagnosis not present

## 2023-04-28 LAB — CBC WITH DIFFERENTIAL/PLATELET
Basophils Absolute: 0 10*3/uL (ref 0.0–0.1)
Basophils Relative: 0.3 % (ref 0.0–3.0)
Eosinophils Absolute: 0.1 10*3/uL (ref 0.0–0.7)
Eosinophils Relative: 1.3 % (ref 0.0–5.0)
HCT: 37.4 % (ref 36.0–46.0)
Hemoglobin: 12 g/dL (ref 12.0–15.0)
Lymphocytes Relative: 35.8 % (ref 12.0–46.0)
Lymphs Abs: 2.4 10*3/uL (ref 0.7–4.0)
MCHC: 32.1 g/dL (ref 30.0–36.0)
MCV: 87.4 fl (ref 78.0–100.0)
Monocytes Absolute: 0.5 10*3/uL (ref 0.1–1.0)
Monocytes Relative: 8.1 % (ref 3.0–12.0)
Neutro Abs: 3.7 10*3/uL (ref 1.4–7.7)
Neutrophils Relative %: 54.5 % (ref 43.0–77.0)
Platelets: 379 10*3/uL (ref 150.0–400.0)
RBC: 4.27 Mil/uL (ref 3.87–5.11)
RDW: 14.5 % (ref 11.5–15.5)
WBC: 6.7 10*3/uL (ref 4.0–10.5)

## 2023-04-28 LAB — FERRITIN: Ferritin: 6 ng/mL — ABNORMAL LOW (ref 10.0–291.0)

## 2023-04-29 ENCOUNTER — Encounter: Payer: Self-pay | Admitting: Internal Medicine

## 2023-04-29 NOTE — Telephone Encounter (Signed)
Please notify - her hgb is wnl.  Iron stores were low.  Given hgb normal, this may not be the cause of her fatigue.  If persistent problems with fatigue and bumps on her lips, can reevaluate.  When here her lips were cracked at corners and this is why I checked her iron stores.  If symptoms changed, can reevaluate to determine what treatment needed.

## 2023-05-06 ENCOUNTER — Encounter: Payer: Self-pay | Admitting: Gastroenterology

## 2023-05-08 ENCOUNTER — Encounter: Payer: Self-pay | Admitting: Gastroenterology

## 2023-05-20 ENCOUNTER — Ambulatory Visit (AMBULATORY_SURGERY_CENTER): Payer: BC Managed Care – PPO | Admitting: Gastroenterology

## 2023-05-20 ENCOUNTER — Encounter: Payer: Self-pay | Admitting: Gastroenterology

## 2023-05-20 VITALS — BP 124/78 | HR 62 | Temp 98.6°F | Resp 16 | Ht 63.0 in | Wt 208.0 lb

## 2023-05-20 DIAGNOSIS — D123 Benign neoplasm of transverse colon: Secondary | ICD-10-CM | POA: Diagnosis not present

## 2023-05-20 DIAGNOSIS — Z1211 Encounter for screening for malignant neoplasm of colon: Secondary | ICD-10-CM | POA: Diagnosis present

## 2023-05-20 DIAGNOSIS — K219 Gastro-esophageal reflux disease without esophagitis: Secondary | ICD-10-CM

## 2023-05-20 DIAGNOSIS — D122 Benign neoplasm of ascending colon: Secondary | ICD-10-CM | POA: Diagnosis not present

## 2023-05-20 DIAGNOSIS — K317 Polyp of stomach and duodenum: Secondary | ICD-10-CM | POA: Diagnosis not present

## 2023-05-20 DIAGNOSIS — D125 Benign neoplasm of sigmoid colon: Secondary | ICD-10-CM

## 2023-05-20 MED ORDER — SODIUM CHLORIDE 0.9 % IV SOLN: 500.0000 mL | Freq: Once | INTRAVENOUS | Status: DC

## 2023-05-20 NOTE — Progress Notes (Signed)
To pacu, VSS. Report to rn.tb °

## 2023-05-20 NOTE — Patient Instructions (Addendum)
FOLLOW ANTI REFLUX REGIMEN ( HANDOUT GIVEN TO YOU TODAY)  AWAIT GASTRIC POLYP BIOPSIES PATHOLOGY RESULTS  HANDOUT ON TIF PROCEDURE FOR REFLUX GIVEN TO YOU TODAY    HANDOUTS ON POLYPS, DIVERTICULOSIS & HEMORRHOIDS GIVEN TO YOU TODAY  AWAIT PATHOLOGY RESULTS ON POLYPS REMOVED AND POLYP BIOPSIED   REPEAT COLONOSCOPY AT HOSPITAL NEXT AVAILABLE APPOINTMENT -DR NANDIGAM'S OFFICE WILL SET THIS UP   YOU HAD AN ENDOSCOPIC PROCEDURE TODAY AT THE [facility]:   Refer to the procedure report that was given to you for any specific questions about what was found during the examination.  If the procedure report does not answer your questions, please call your gastroenterologist to clarify.  If you requested that your care partner not be given the details of your procedure findings, then the procedure report has been included in a sealed envelope for you to review at your convenience later.  YOU SHOULD EXPECT: Some feelings of bloating in the abdomen. Passage of more gas than usual.  Walking can help get rid of the air that was put into your GI tract during the procedure and reduce the bloating. If you had a lower endoscopy (such as a colonoscopy or flexible sigmoidoscopy) you may notice spotting of blood in your stool or on the toilet paper. If you underwent a bowel prep for your procedure, you may not have a normal bowel movement for a few days.  Please Note:  You might notice some irritation and congestion in your nose or some drainage.  This is from the oxygen used during your procedure.  There is no need for concern and it should clear up in a day or so.  SYMPTOMS TO REPORT IMMEDIATELY:  Following lower endoscopy (colonoscopy or flexible sigmoidoscopy):  Excessive amounts of blood in the stool  Significant tenderness or worsening of abdominal pains  Swelling of the abdomen that is new, acute  Fever of 100F or higher  Following upper endoscopy (EGD)  Vomiting of blood or coffee  ground material  New chest pain or pain under the shoulder blades  Painful or persistently difficult swallowing  New shortness of breath  Fever of 100F or higher  Black, tarry-looking stools  For urgent or emergent issues, a gastroenterologist can be reached at any hour by calling [phone]. Do not use MyChart messaging for urgent concerns.    DIET:  We do recommend a small meal at first, but then you may proceed to your regular diet.  Drink plenty of fluids but you should avoid alcoholic beverages for 24 hours.  ACTIVITY:  You should plan to take it easy for the rest of today and you should NOT DRIVE or use heavy machinery until tomorrow (because of the sedation medicines used during the test).    FOLLOW UP: Our staff will call the number listed on your records the next business day following your procedure.  We will call around 7:15- 8:00 am to check on you and address any questions or concerns that you may have regarding the information given to you following your procedure. If we do not reach you, we will leave a message.     If any biopsies were taken you will be contacted by phone or by letter within the next 1-3 weeks.  Please call us at [phone] if you have not heard about the biopsies in 3 weeks.    SIGNATURES/CONFIDENTIALITY: You and/or your care partner have signed paperwork which will be entered into your electronic medical record.  These signatures attest to the fact that that the information above on your After Visit Summary has been reviewed and is understood.  Full responsibility of the confidentiality of this discharge information lies with you and/or your care-partner.

## 2023-05-20 NOTE — Op Note (Signed)
[facility] Patient Name: Carrie [name] Procedure Date: 05/20/2023 2:21 PM MRN: [id number] Endoscopist: Napoleon Form , MD, [id number] Age: 47 Referring MD:  Date of Birth: 01/22/1976 Gender: Female Account #: [id number] Procedure:                Upper GI endoscopy Indications:              Heartburn, Esophageal reflux symptoms that persist                            despite appropriate therapy Medicines:                Monitored Anesthesia Care Procedure:                Pre-Anesthesia Assessment:                           - Prior to the procedure, a History and Physical                            was performed, and patient medications and                            allergies were reviewed. The patient's tolerance of                            previous anesthesia was also reviewed. The risks                            and benefits of the procedure and the sedation                            options and risks were discussed with the patient.                            All questions were answered, and informed consent                            was obtained. Prior Anticoagulants: The patient has                            taken no anticoagulant or antiplatelet agents. ASA                            Grade Assessment: II - A patient with mild systemic                            disease. After reviewing the risks and benefits,                            the patient was deemed in satisfactory condition to                            undergo the procedure.  After obtaining informed consent, the endoscope was                            passed under direct vision. Throughout the                            procedure, the patient's blood pressure, pulse, and                            oxygen saturations were monitored continuously. The                            Olympus scope [id number] was introduced through the                            mouth, and advanced to  the second part of duodenum.                            The upper GI endoscopy was accomplished without                            difficulty. The patient tolerated the procedure                            well. Scope In: Scope Out: Findings:                 The Z-line was regular and was found 33 cm from the                            incisors.                           The gastroesophageal flap valve was visualized                            endoscopically and classified as Hill Grade II                            (fold present, opens with respiration).                           A few 7 to 15 mm pedunculated and sessile polyps                            with no bleeding and no stigmata of recent bleeding                            were found in the gastric body. Biopsies were taken                            with a cold forceps for histology.                           The cardia  and gastric fundus were normal on                            retroflexion.                           The examined duodenum was normal. Complications:            No immediate complications. Estimated Blood Loss:     Estimated blood loss was minimal. Impression:               - Z-line regular, 33 cm from the incisors.                           - Gastroesophageal flap valve classified as Hill                            Grade II (fold present, opens with respiration).                           - A few gastric polyps. Biopsied.                           - Normal examined duodenum. Recommendation:           - Resume previous diet.                           - Continue present medications.                           - Await pathology results.                           - Follow an antireflux regimen. Napoleon Form, MD 05/20/2023 3:17:19 PM This report has been signed electronically.

## 2023-05-20 NOTE — Op Note (Signed)
[facility] Patient Name: Carrie [name] Procedure Date: 05/20/2023 2:20 PM MRN: [id number] Endoscopist: Napoleon Form , MD, [id number] Age: 47 Referring MD:  Date of Birth: 1976/01/05 Gender: Female Account #: [id number] Procedure:                Colonoscopy Indications:              Screening for colorectal malignant neoplasm Medicines:                Monitored Anesthesia Care Procedure:                Pre-Anesthesia Assessment:                           - Prior to the procedure, a History and Physical                            was performed, and patient medications and                            allergies were reviewed. The patient's tolerance of                            previous anesthesia was also reviewed. The risks                            and benefits of the procedure and the sedation                            options and risks were discussed with the patient.                            All questions were answered, and informed consent                            was obtained. Prior Anticoagulants: The patient has                            taken no anticoagulant or antiplatelet agents. ASA                            Grade Assessment: II - A patient with mild systemic                            disease. After reviewing the risks and benefits,                            the patient was deemed in satisfactory condition to                            undergo the procedure.                           After obtaining informed consent, the colonoscope  was passed under direct vision. Throughout the                            procedure, the patient's blood pressure, pulse, and                            oxygen saturations were monitored continuously. The                            Olympus [id number]-[id number] (#[id number]) Colonoscope was                            introduced through the anus and advanced to the the                            cecum,  identified by appendiceal orifice and                            ileocecal valve. The colonoscopy was performed                            without difficulty. The patient tolerated the                            procedure well. The quality of the bowel                            preparation was good. The ileocecal valve,                            appendiceal orifice, and rectum were photographed. Scope In: 2:43:00 PM Scope Out: 3:01:15 PM Scope Withdrawal Time: 0 hours 9 minutes 44 seconds  Total Procedure Duration: 0 hours 18 minutes 15 seconds  Findings:                 The perianal and digital rectal examinations were                            normal.                           Two sessile polyps were found in the sigmoid colon                            and hepatic flexure. The polyps were 8 to 10 mm in                            size. These polyps were removed with a cold snare.                            Resection and retrieval were complete.                           Scattered small-mouthed diverticula were found in  the sigmoid colon and descending colon.                           A 25 mm polyp was found in the ascending colon. The                            polyp was mucous-capped and sessile. Biopsies were                            taken with a cold forceps for histology.                           Non-bleeding external and internal hemorrhoids were                            found during retroflexion. The hemorrhoids were                            medium-sized.                           The exam was otherwise without abnormality. Complications:            No immediate complications. Estimated Blood Loss:     Estimated blood loss was minimal. Impression:               - Two 8 to 10 mm polyps in the sigmoid colon and at                            the hepatic flexure, removed with a cold snare.                            Resected and retrieved.                            - Diverticulosis in the sigmoid colon and in the                            descending colon.                           - One 25 mm polyp in the ascending colon. Biopsied.                           - Non-bleeding external and internal hemorrhoids.                           - The examination was otherwise normal. Recommendation:           - Patient has a contact number available for                            emergencies. The signs and symptoms of potential  delayed complications were discussed with the                            patient. Return to normal activities tomorrow.                            Written discharge instructions were provided to the                            patient.                           - Resume previous diet.                           - Continue present medications.                           - Await pathology results.                           - Repeat colonoscopy at the next available                            appointment at hospital endoscopy unit for EMR. To                            be scheduled. Napoleon Form, MD 05/20/2023 3:14:13 PM This report has been signed electronically.

## 2023-05-20 NOTE — Progress Notes (Signed)
[facility] History and Physical   Primary Care Physician:  Dale [place], MD   Reason for Procedure:  GERD and Colorectal cancer screening  Plan:    EGD and Screening colonoscopy with possible interventions as needed     HPI: Carrie [name] is a very pleasant 47 y.o. female here for screening colonoscopy and EGD for evaluation of long standing GERD symptoms, r/o erosive esophagitis or Barrett's esophagus. Denies any nausea, vomiting, abdominal pain, melena or bright red blood per rectum  The risks and benefits as well as alternatives of endoscopic procedure(s) have been discussed and reviewed. All questions answered. The patient agrees to proceed.    Past Medical History:  Diagnosis Date   Abnormal Pap smear    Allergy    Anxiety    Arthritis    t-12   Asthma    exercised induced   Depression    GERD (gastroesophageal reflux disease)    Headache(784.0)    History of sensory changes 08/09/2020   Mononucleosis 1998   PONV (postoperative nausea and vomiting)    takes awhile to wake up   UTI (urinary tract infection)     Past Surgical History:  Procedure Laterality Date   CARPAL TUNNEL RELEASE Bilateral 2016   CESAREAN SECTION N/A 10/30/2013   Procedure: CESAREAN SECTION;  Surgeon: Meriel Pica, MD;  Location: [facility];  Service: Obstetrics;  Laterality: N/A;  PRIMARY   CHOLECYSTECTOMY N/A 10/24/2017   Procedure: LAPAROSCOPIC CHOLECYSTECTOMY;  Surgeon: Lattie Haw, MD;  Location: [facility] OR[facility];  Service: General;  Laterality: N/A;   COLPOSCOPY W/ BIOPSY / CURETTAGE     DE QUERVAIN'S RELEASE Left 2016   DILATION AND CURETTAGE OF UTERUS     FOOT FRACTURE SURGERY Left    HAND SURGERY Left    Fracture repair   IVF      Prior to Admission medications   Medication Sig Start Date End Date Taking? Authorizing Provider  amphetamine-dextroamphetamine (ADDERALL) 20 MG tablet Take 20 mg by mouth 2 (two) times daily. 04/13/23  Yes [provider]   CALCIUM PO Take 1 tablet by mouth daily.   Yes [provider]  fluticasone (FLONASE) 50 MCG/ACT nasal spray PLACE 1 SPRAY INTO BOTH NOSTRILS AT BEDTIME. 12/13/19  Yes Dale [place], MD  magnesium oxide (MAG-OX) 400 (241.3 Mg) MG tablet TAKE 1 TABLET EVERY DAY 10/07/16  Yes Dale [place], MD  pantoprazole (PROTONIX) 40 MG tablet TAKE 1 TABLET BY MOUTH EVERY DAY 12/16/22  Yes Dale [place], MD  TRINTELLIX 10 MG TABS tablet Take by mouth. 07/25/22  Yes [provider]  acetaminophen (TYLENOL) 500 MG tablet Take 1,000-1,500 mg by mouth every 6 (six) hours as needed for moderate pain or headache.     [provider]  dexmethylphenidate (FOCALIN XR) 20 MG 24 hr capsule  01/25/22   [provider]  famotidine (PEPCID) 10 MG tablet Take 10 mg by mouth at bedtime. Only in the morning.    [provider]  Fremanezumab-vfrm (AJOVY) 225 MG/1.5ML SOAJ Inject 225 mg into the skin every 30 (thirty) days. 06/27/21   [provider]  ibuprofen (ADVIL,MOTRIN) 200 MG tablet Take 600 mg by mouth every 6 (six) hours as needed for headache or moderate pain.    [provider]  levalbuterol Pauline Aus HFA) 45 MCG/ACT inhaler Inhale 1-2 puffs into the lungs every 6 (six) hours as needed for wheezing. 08/18/22   Becky [place]a, NP  Omega-3 Fatty Acids (FISH OIL BURP-LESS PO) Take 1  capsule by mouth daily.     [provider]  propranolol (INDERAL) 20 MG tablet Take 1 tablet (20 mg total) by mouth 3 (three) times daily as needed. 05/20/18   Antonieta Iba, MD  Spacer/Aero-Holding Chambers (AEROCHAMBER MV) inhaler Use as instructed 08/18/22   Becky Augusta, NP  SUMAtriptan (IMITREX) 100 MG tablet Take 100 mg by mouth as needed. 06/27/21   [provider]  Ubrogepant (UBRELVY) 100 MG TABS Take by mouth. 05/05/20   [provider]    Current Outpatient Medications  Medication Sig Dispense Refill   amphetamine-dextroamphetamine (ADDERALL) 20  MG tablet Take 20 mg by mouth 2 (two) times daily.     CALCIUM PO Take 1 tablet by mouth daily.     fluticasone (FLONASE) 50 MCG/ACT nasal spray PLACE 1 SPRAY INTO BOTH NOSTRILS AT BEDTIME. 48 mL 2   magnesium oxide (MAG-OX) 400 (241.3 Mg) MG tablet TAKE 1 TABLET EVERY DAY 30 tablet 1   pantoprazole (PROTONIX) 40 MG tablet TAKE 1 TABLET BY MOUTH EVERY DAY 90 tablet 1   TRINTELLIX 10 MG TABS tablet Take by mouth.     acetaminophen (TYLENOL) 500 MG tablet Take 1,000-1,500 mg by mouth every 6 (six) hours as needed for moderate pain or headache.      dexmethylphenidate (FOCALIN XR) 20 MG 24 hr capsule      famotidine (PEPCID) 10 MG tablet Take 10 mg by mouth at bedtime. Only in the morning.     Fremanezumab-vfrm (AJOVY) 225 MG/1.5ML SOAJ Inject 225 mg into the skin every 30 (thirty) days.     ibuprofen (ADVIL,MOTRIN) 200 MG tablet Take 600 mg by mouth every 6 (six) hours as needed for headache or moderate pain.     levalbuterol (XOPENEX HFA) 45 MCG/ACT inhaler Inhale 1-2 puffs into the lungs every 6 (six) hours as needed for wheezing. 45 g 0   Omega-3 Fatty Acids (FISH OIL BURP-LESS PO) Take 1 capsule by mouth daily.      propranolol (INDERAL) 20 MG tablet Take 1 tablet (20 mg total) by mouth 3 (three) times daily as needed. 90 tablet 3   Spacer/Aero-Holding Chambers (AEROCHAMBER MV) inhaler Use as instructed 1 each 2   SUMAtriptan (IMITREX) 100 MG tablet Take 100 mg by mouth as needed.     Ubrogepant (UBRELVY) 100 MG TABS Take by mouth.     Current Facility-Administered Medications  Medication Dose Route Frequency Provider Last Rate Last Admin   0.9 %  sodium chloride infusion  500 mL Intravenous Once Napoleon Form, MD        Allergies as of 05/20/2023 - Review Complete 05/20/2023  Allergen Reaction Noted   Citalopram Hives, Itching, and Rash 05/09/2019    Family History  Problem Relation Age of Onset   Asthma Mother    Arthritis Mother    Diverticulitis Mother    Thyroid disease  Mother    GER disease Mother    Migraines Mother    Arthritis Father    Hypertension Father    Hyperlipidemia Father    Thyroid disease Father    Allergic rhinitis Sister    Anemia Sister    Thyroid disease Sister    Thyroid disease Sister    Bipolar disorder Sister    Arrhythmia Sister        Ventricular Tachycardia   Healthy Brother    Diabetes Maternal Grandmother    Heart attack Maternal Grandmother    Alzheimer's disease Maternal Grandmother    Emphysema  Maternal Grandfather    Breast cancer Paternal Grandmother    Heart disease Paternal Grandfather    Heart attack Paternal Grandfather    Liver disease Neg Hx    Esophageal cancer Neg Hx    Colon cancer Neg Hx     Social History   Socioeconomic History   Marital status: Married    Spouse name: Not on file   Number of children: 1   Years of education: Master's   Highest education level: Not on file  Occupational History   Occupation: Air cabin crew    Comment: Facilities manager  Tobacco Use   Smoking status: Never   Smokeless tobacco: Never  Vaping Use   Vaping Use: Never used  Substance and Sexual Activity   Alcohol use: No   Drug use: No   Sexual activity: Not Currently    Comment: couple is afraid  to mess something up  Other Topics Concern   Not on file  Social History Narrative   Not on file   Social Determinants of Health   Financial Resource Strain: Low Risk  (10/17/2021)   Overall Financial Resource Strain (CARDIA)    Difficulty of Paying Living Expenses: Not hard at all  Food Insecurity: Not on file  Transportation Needs: Not on file  Physical Activity: Not on file  Stress: Not on file  Social Connections: Not on file  Intimate Partner Violence: Not on file    Review of Systems:  All other review of systems negative except as mentioned in the HPI.  Physical Exam: Vital signs in last 24 hours: Blood Pressure 114/72   Pulse 80   Temperature 98.6 F (37 C)   Height 5\' 3"  (1.6 m)    Weight 208 lb (94.3 kg)   Last Menstrual Period 05/12/2023   Oxygen Saturation 99%   Body Mass Index 36.85 kg/m  General:   Alert, NAD Lungs:  Clear .   Heart:  Regular rate and rhythm Abdomen:  Soft, nontender and nondistended. Neuro/Psych:  Alert and cooperative. Normal mood and affect. A and O x 3  Reviewed labs, radiology imaging, old records and pertinent past GI work up  Patient is appropriate for planned procedure(s) and anesthesia in an ambulatory setting   K. Scherry Ran , MD [phone]

## 2023-05-21 ENCOUNTER — Telehealth: Payer: Self-pay | Admitting: *Deleted

## 2023-05-21 ENCOUNTER — Encounter: Payer: Self-pay | Admitting: Internal Medicine

## 2023-05-21 DIAGNOSIS — Z86010 Personal history of colonic polyps: Secondary | ICD-10-CM | POA: Insufficient documentation

## 2023-05-21 NOTE — Telephone Encounter (Signed)
  Follow up Call-     05/20/2023    1:58 PM  Call back number  Post procedure Call Back phone  # [phone]  Permission to leave phone message Yes     Patient questions:  Do you have a fever, pain , or abdominal swelling? No. Pain Score  0 *  Have you tolerated food without any problems? Yes.    Have you been able to return to your normal activities? Yes.    Do you have any questions about your discharge instructions: Diet   No. Medications  No. Follow up visit  No.  Do you have questions or concerns about your Care? No.  Actions: * If pain score is 4 or above: No action needed, pain <4.

## 2023-05-22 ENCOUNTER — Telehealth: Payer: Self-pay | Admitting: *Deleted

## 2023-05-22 ENCOUNTER — Other Ambulatory Visit: Payer: Self-pay | Admitting: *Deleted

## 2023-05-22 DIAGNOSIS — D122 Benign neoplasm of ascending colon: Secondary | ICD-10-CM

## 2023-05-22 DIAGNOSIS — D125 Benign neoplasm of sigmoid colon: Secondary | ICD-10-CM

## 2023-05-22 DIAGNOSIS — D123 Benign neoplasm of transverse colon: Secondary | ICD-10-CM

## 2023-05-22 NOTE — Telephone Encounter (Signed)
I have put in case for this patient for 09/11/2023 she is scheduled for Colon EMR on 09/11/2023 at 8:45am Case ID [id number] I will call the patient and see when she wants to do her preop appointment

## 2023-05-22 NOTE — Telephone Encounter (Signed)
Please schedule patient on October 31, if they have any cancellations or availability earlier we can move the case.  Thank you

## 2023-05-22 NOTE — Telephone Encounter (Signed)
Per Dr Sharol Given report patinet needs a colon with EMR scheduled for next available

## 2023-05-22 NOTE — Telephone Encounter (Signed)
Dr Lavon Paganini you have no openings until 10/31 at the hospital. When you get a chance let me know a what's a good date  to schedule her

## 2023-05-22 NOTE — Telephone Encounter (Signed)
Spoke with patient she is agreeable with that date and time 10/31 at 8:45 am  Pre-op appointment 08/13/2023 at 11 am.    FYI Dr Lavon Paganini

## 2023-06-12 ENCOUNTER — Encounter: Payer: Self-pay | Admitting: Gastroenterology

## 2023-06-13 ENCOUNTER — Other Ambulatory Visit: Payer: Self-pay | Admitting: Internal Medicine

## 2023-06-26 ENCOUNTER — Encounter (INDEPENDENT_AMBULATORY_CARE_PROVIDER_SITE_OTHER): Payer: Self-pay

## 2023-07-28 ENCOUNTER — Encounter: Payer: Self-pay | Admitting: Internal Medicine

## 2023-07-28 ENCOUNTER — Ambulatory Visit: Payer: BC Managed Care – PPO | Admitting: Internal Medicine

## 2023-07-28 VITALS — BP 128/78 | HR 82 | Temp 98.2°F | Ht 63.0 in | Wt 210.4 lb

## 2023-07-28 DIAGNOSIS — E611 Iron deficiency: Secondary | ICD-10-CM | POA: Insufficient documentation

## 2023-07-28 DIAGNOSIS — Z111 Encounter for screening for respiratory tuberculosis: Secondary | ICD-10-CM

## 2023-07-28 DIAGNOSIS — G43019 Migraine without aura, intractable, without status migrainosus: Secondary | ICD-10-CM

## 2023-07-28 DIAGNOSIS — K219 Gastro-esophageal reflux disease without esophagitis: Secondary | ICD-10-CM

## 2023-07-28 DIAGNOSIS — Z1211 Encounter for screening for malignant neoplasm of colon: Secondary | ICD-10-CM

## 2023-07-28 DIAGNOSIS — Z1231 Encounter for screening mammogram for malignant neoplasm of breast: Secondary | ICD-10-CM

## 2023-07-28 DIAGNOSIS — F419 Anxiety disorder, unspecified: Secondary | ICD-10-CM

## 2023-07-28 DIAGNOSIS — Z23 Encounter for immunization: Secondary | ICD-10-CM

## 2023-07-28 DIAGNOSIS — E7800 Pure hypercholesterolemia, unspecified: Secondary | ICD-10-CM

## 2023-07-28 DIAGNOSIS — J309 Allergic rhinitis, unspecified: Secondary | ICD-10-CM

## 2023-07-28 NOTE — Progress Notes (Signed)
Subjective:    Patient ID: Carrie [name], female    DOB: [birth year], 47 y.o.   MRN: [id number]  Patient here for  Chief Complaint  Patient presents with   Medical Management of Chronic Issues    HPI Here for a follow up appt.  Here to follow up regarding hypercholesterolemia and GERD. Increased stress.  Seeing Dr Maryruth Bun. On Trintellix. Overall appears to be handling things relatively well.  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  Discussed.  Tries to stay active.  Breathing stable.  Off ajovy x 2 months.  Planning to restart.  Headaches previously controlled.  Saw GI 03/21/23 - recommended EGD and colonoscopy.  EGD 05/20/23- few gastric polyps.  Colonoscopy - two 8-10 mm polyps in the sigmoid colon and at the hepatic flexure, diverticulosis, one 25mm polyp in the ascending colon and non bleeding external and internal hemorrhoids.  Scheduled for f/u colonoscopy 09/11/23.    Past Medical History:  Diagnosis Date   Abnormal Pap smear    Allergy    Anxiety    Arthritis    t-12   Asthma    exercised induced   Depression    GERD (gastroesophageal reflux disease)    Headache(784.0)    History of sensory changes 08/09/2020   Mononucleosis 1998   PONV (postoperative nausea and vomiting)    takes awhile to wake up   UTI (urinary tract infection)    Past Surgical History:  Procedure Laterality Date   CARPAL TUNNEL RELEASE Bilateral 2016   CESAREAN SECTION N/A 10/30/2013   Procedure: CESAREAN SECTION;  Surgeon: Meriel Pica, MD;  Location: [facility];  Service: Obstetrics;  Laterality: N/A;  PRIMARY   CHOLECYSTECTOMY N/A 10/24/2017   Procedure: LAPAROSCOPIC CHOLECYSTECTOMY;  Surgeon: Lattie Haw, MD;  Location: [facility];  Service: General;  Laterality: N/A;   COLPOSCOPY W/ BIOPSY / CURETTAGE     DE QUERVAIN'S RELEASE Left 2016   DILATION AND CURETTAGE OF UTERUS     FOOT FRACTURE SURGERY Left    HAND SURGERY Left    Fracture repair   IVF     Family History  Problem Relation Age of  Onset   Asthma Mother    Arthritis Mother    Diverticulitis Mother    Thyroid disease Mother    GER disease Mother    Migraines Mother    Arthritis Father    Hypertension Father    Hyperlipidemia Father    Thyroid disease Father    Allergic rhinitis Sister    Anemia Sister    Thyroid disease Sister    Thyroid disease Sister    Bipolar disorder Sister    Arrhythmia Sister        Ventricular Tachycardia   Healthy Brother    Diabetes Maternal Grandmother    Heart attack Maternal Grandmother    Alzheimer's disease Maternal Grandmother    Emphysema Maternal Grandfather    Breast cancer Paternal Grandmother    Heart disease Paternal Grandfather    Heart attack Paternal Grandfather    Liver disease Neg Hx    Esophageal cancer Neg Hx    Colon cancer Neg Hx    Social History   Socioeconomic History   Marital status: Married    Spouse name: Not on file   Number of children: 1   Years of education: Master's   Highest education level: Master's degree (e.g., MA, MS, MEng, MEd, MSW, MBA)  Occupational History   Occupation: Air cabin crew    Comment:  [employer]  Tobacco Use   Smoking status: Never   Smokeless tobacco: Never  Vaping Use   Vaping status: Never Used  Substance and Sexual Activity   Alcohol use: No   Drug use: No   Sexual activity: Not Currently    Comment: couple is afraid  to mess something up  Other Topics Concern   Not on file  Social History Narrative   Not on file   Social Determinants of Health   Financial Resource Strain: Low Risk  (07/27/2023)   Overall Financial Resource Strain (CARDIA)    Difficulty of Paying Living Expenses: Not hard at all  Food Insecurity: No Food Insecurity (07/27/2023)   Hunger Vital Sign    Worried About Running Out of Food in the Last Year: Never true    Ran Out of Food in the Last Year: Never true  Transportation Needs: No Transportation Needs (07/27/2023)   PRAPARE - Administrator, Civil Service  (Medical): No    Lack of Transportation (Non-Medical): No  Physical Activity: Insufficiently Active (07/27/2023)   Exercise Vital Sign    Days of Exercise per Week: 1 day    Minutes of Exercise per Session: 10 min  Stress: No Stress Concern Present (07/27/2023)   [employer] of Occupational Health - Occupational Stress Questionnaire    Feeling of Stress : Only a little  Social Connections: Socially Isolated (07/27/2023)   Social Connection and Isolation Panel [NHANES]    Frequency of Communication with Friends and Family: Once a week    Frequency of Social Gatherings with Friends and Family: Never    Attends Religious Services: Never    Database administrator or Organizations: No    Attends Engineer, structural: Not on file    Marital Status: Married     Review of Systems  Constitutional:  Negative for appetite change and unexpected weight change.  HENT:  Negative for congestion and sinus pressure.   Respiratory:  Negative for cough, chest tightness and shortness of breath.   Cardiovascular:  Negative for chest pain and palpitations.  Gastrointestinal:  Negative for abdominal pain, diarrhea, nausea and vomiting.  Genitourinary:  Negative for difficulty urinating and dysuria.  Musculoskeletal:  Negative for joint swelling and myalgias.  Skin:  Negative for color change and rash.  Neurological:  Negative for dizziness.  Psychiatric/Behavioral:  Negative for agitation and dysphoric mood.        Objective:     BP 128/78   Pulse 82   Temp 98.2 F (36.8 C) (Oral)   Ht 5\' 3"  (1.6 m)   Wt 210 lb 6.4 oz (95.4 kg)   SpO2 99%   BMI 37.27 kg/m  Wt Readings from Last 3 Encounters:  07/28/23 210 lb 6.4 oz (95.4 kg)  05/20/23 208 lb (94.3 kg)  03/27/23 211 lb (95.7 kg)    Physical Exam Vitals reviewed.  Constitutional:      General: She is not in acute distress.    Appearance: Normal appearance.  HENT:     Head: Normocephalic and atraumatic.     Right Ear:  External ear normal.     Left Ear: External ear normal.  Eyes:     General: No scleral icterus.       Right eye: No discharge.        Left eye: No discharge.     Conjunctiva/sclera: Conjunctivae normal.  Neck:     Thyroid: No thyromegaly.  Cardiovascular:  Rate and Rhythm: Normal rate and regular rhythm.  Pulmonary:     Effort: No respiratory distress.     Breath sounds: Normal breath sounds. No wheezing.  Abdominal:     General: Bowel sounds are normal.     Palpations: Abdomen is soft.     Tenderness: There is no abdominal tenderness.  Musculoskeletal:        General: No swelling or tenderness.     Cervical back: Neck supple. No tenderness.  Lymphadenopathy:     Cervical: No cervical adenopathy.  Skin:    Findings: No erythema or rash.  Neurological:     Mental Status: She is alert.  Psychiatric:        Mood and Affect: Mood normal.        Behavior: Behavior normal.      Outpatient Encounter Medications as of 07/28/2023  Medication Sig   acetaminophen (TYLENOL) 500 MG tablet Take 1,000-1,500 mg by mouth every 6 (six) hours as needed for moderate pain or headache.    amphetamine-dextroamphetamine (ADDERALL) 20 MG tablet Take 20 mg by mouth 2 (two) times daily.   CALCIUM PO Take 1 tablet by mouth daily.   famotidine (PEPCID) 10 MG tablet Take 10 mg by mouth at bedtime. Only in the morning.   ferrous sulfate 325 (65 FE) MG EC tablet Take 325 mg by mouth daily.   fluticasone (FLONASE) 50 MCG/ACT nasal spray PLACE 1 SPRAY INTO BOTH NOSTRILS AT BEDTIME.   Fremanezumab-vfrm (AJOVY) 225 MG/1.5ML SOAJ Inject 225 mg into the skin every 30 (thirty) days.   ibuprofen (ADVIL,MOTRIN) 200 MG tablet Take 600 mg by mouth every 6 (six) hours as needed for headache or moderate pain.   levalbuterol (XOPENEX HFA) 45 MCG/ACT inhaler Inhale 1-2 puffs into the lungs every 6 (six) hours as needed for wheezing.   magnesium oxide (MAG-OX) 400 (241.3 Mg) MG tablet TAKE 1 TABLET EVERY DAY    Omega-3 Fatty Acids (FISH OIL BURP-LESS PO) Take 1 capsule by mouth daily.    pantoprazole (PROTONIX) 40 MG tablet TAKE 1 TABLET BY MOUTH EVERY DAY   propranolol (INDERAL) 20 MG tablet Take 1 tablet (20 mg total) by mouth 3 (three) times daily as needed.   Spacer/Aero-Holding Chambers (AEROCHAMBER MV) inhaler Use as instructed   SUMAtriptan (IMITREX) 100 MG tablet Take 100 mg by mouth as needed.   TRINTELLIX 10 MG TABS tablet Take by mouth.   Ubrogepant (UBRELVY) 100 MG TABS Take by mouth.   [DISCONTINUED] dexmethylphenidate (FOCALIN XR) 20 MG 24 hr capsule    No facility-administered encounter medications on file as of 07/28/2023.     Lab Results  Component Value Date   WBC 8.1 07/28/2023   HGB 12.1 07/28/2023   HCT 37.5 07/28/2023   PLT 384.0 07/28/2023   GLUCOSE 96 07/28/2023   CHOL 227 (H) 12/06/2022   TRIG 128.0 12/06/2022   HDL 51.70 12/06/2022   LDLDIRECT 134.0 03/07/2022   LDLCALC 150 (H) 12/06/2022   ALT 13 07/28/2023   AST 15 07/28/2023   NA 138 07/28/2023   K 4.3 07/28/2023   CL 103 07/28/2023   CREATININE 0.73 07/28/2023   BUN 11 07/28/2023   CO2 27 07/28/2023   TSH 1.56 07/28/2023    DG Chest 2 View  Result Date: 08/18/2022 CLINICAL DATA:  Cough for 4 days EXAM: CHEST - 2 VIEW COMPARISON:  Chest x-ray December 23, 2018 FINDINGS: The cardiomediastinal silhouette is unchanged in contour. No focal pulmonary opacity. No pleural effusion or  pneumothorax. Surgical clips in the right upper quadrant. No acute osseous abnormality. IMPRESSION: No active cardiopulmonary disease. Electronically Signed   By: Jacob Moores M.D.   On: 08/18/2022 14:26       Assessment & Plan:  Hypercholesteremia Assessment & Plan: Low cholesterol diet and exercise.  Follow lipid panel.   Orders: -     Hepatic function panel -     Basic metabolic panel -     TSH  Iron deficiency Assessment & Plan: Check cbc and iron studies.   Orders: -     CBC with Differential/Platelet -      Vitamin B12 -     IBC + Ferritin; Future  Encounter for screening mammogram for malignant neoplasm of breast -     3D Screening Mammogram, Left and Right; Future  Encounter for tuberculin skin test -     QuantiFERON-TB Gold Plus  Need for influenza vaccination -     Flu vaccine trivalent PF, 6mos and older(Flulaval,Afluria,Fluarix,Fluzone)  Allergic rhinitis, unspecified seasonality, unspecified trigger Assessment & Plan: Appears to be controlled on current regimen.     Anxiety Assessment & Plan: Sees Dr Maryruth Bun.  On trintelix.  Follow.     Colon cancer screening Assessment & Plan: Colonoscopy (05/2023) - two 8-10 mm polyps in the sigmoid colon and at the hepatic flexure, diverticulosis, one 25mm polyp in the ascending colon and non bleeding external and internal hemorrhoids.  Scheduled for f/u colonoscopy 09/11/23.    Common migraine with intractable migraine Assessment & Plan: S/p SPG block.  Followed by neurology.  Restart ajovy.  Was controlled on previous medication regimen. Follow.    Gastroesophageal reflux disease, unspecified whether esophagitis present Assessment & Plan: EGD as outlined.  Continue PPI      Dale Hunter, MD

## 2023-07-29 LAB — CBC WITH DIFFERENTIAL/PLATELET
Basophils Absolute: 0.1 10*3/uL (ref 0.0–0.1)
Basophils Relative: 0.7 % (ref 0.0–3.0)
Eosinophils Absolute: 0.1 10*3/uL (ref 0.0–0.7)
Eosinophils Relative: 0.9 % (ref 0.0–5.0)
HCT: 37.5 % (ref 36.0–46.0)
Hemoglobin: 12.1 g/dL (ref 12.0–15.0)
Lymphocytes Relative: 35 % (ref 12.0–46.0)
Lymphs Abs: 2.8 10*3/uL (ref 0.7–4.0)
MCHC: 32.3 g/dL (ref 30.0–36.0)
MCV: 88.7 fl (ref 78.0–100.0)
Monocytes Absolute: 0.5 10*3/uL (ref 0.1–1.0)
Monocytes Relative: 6.5 % (ref 3.0–12.0)
Neutro Abs: 4.6 10*3/uL (ref 1.4–7.7)
Neutrophils Relative %: 56.9 % (ref 43.0–77.0)
Platelets: 384 10*3/uL (ref 150.0–400.0)
RBC: 4.23 Mil/uL (ref 3.87–5.11)
RDW: 14.2 % (ref 11.5–15.5)
WBC: 8.1 10*3/uL (ref 4.0–10.5)

## 2023-07-29 LAB — HEPATIC FUNCTION PANEL
ALT: 13 U/L (ref 0–35)
AST: 15 U/L (ref 0–37)
Albumin: 4 g/dL (ref 3.5–5.2)
Alkaline Phosphatase: 60 U/L (ref 39–117)
Bilirubin, Direct: 0 mg/dL (ref 0.0–0.3)
Total Bilirubin: 0.3 mg/dL (ref 0.2–1.2)
Total Protein: 6.6 g/dL (ref 6.0–8.3)

## 2023-07-29 LAB — BASIC METABOLIC PANEL
BUN: 11 mg/dL (ref 6–23)
CO2: 27 meq/L (ref 19–32)
Calcium: 9.2 mg/dL (ref 8.4–10.5)
Chloride: 103 meq/L (ref 96–112)
Creatinine, Ser: 0.73 mg/dL (ref 0.40–1.20)
GFR: 98.54 mL/min (ref >60.00)
Glucose, Bld: 96 mg/dL (ref 70–99)
Potassium: 4.3 meq/L (ref 3.5–5.1)
Sodium: 138 meq/L (ref 135–145)

## 2023-07-29 LAB — TSH: TSH: 1.56 u[IU]/mL (ref 0.35–5.50)

## 2023-07-29 LAB — VITAMIN B12: Vitamin B-12: 240 pg/mL (ref 211–911)

## 2023-07-31 ENCOUNTER — Encounter: Payer: Self-pay | Admitting: Internal Medicine

## 2023-08-02 ENCOUNTER — Encounter: Payer: Self-pay | Admitting: Internal Medicine

## 2023-08-02 NOTE — Assessment & Plan Note (Signed)
Colonoscopy (05/2023) - two 8-10 mm polyps in the sigmoid colon and at the hepatic flexure, diverticulosis, one 25mm polyp in the ascending colon and non bleeding external and internal hemorrhoids.  Scheduled for f/u colonoscopy 09/11/23.

## 2023-08-02 NOTE — Assessment & Plan Note (Signed)
Low cholesterol diet and exercise.  Follow lipid panel.   

## 2023-08-02 NOTE — Assessment & Plan Note (Signed)
Sees Dr Maryruth Bun.  On trintelix.  Follow.

## 2023-08-02 NOTE — Assessment & Plan Note (Signed)
S/p SPG block.  Followed by neurology.  Restart ajovy.  Was controlled on previous medication regimen. Follow.

## 2023-08-02 NOTE — Assessment & Plan Note (Signed)
Check cbc and iron studies.

## 2023-08-02 NOTE — Assessment & Plan Note (Signed)
EGD as outlined.  Continue PPI

## 2023-08-02 NOTE — Assessment & Plan Note (Signed)
Appears to be controlled on current regimen.

## 2023-08-04 NOTE — Telephone Encounter (Signed)
Have her restart oral B12 and take regularly.  We will follow.

## 2023-08-11 ENCOUNTER — Encounter: Payer: Self-pay | Admitting: Internal Medicine

## 2023-08-12 NOTE — Telephone Encounter (Signed)
Form completed and in box.  See me about form.

## 2023-08-12 NOTE — Telephone Encounter (Signed)
Do you have her form? 

## 2023-08-13 ENCOUNTER — Ambulatory Visit (AMBULATORY_SURGERY_CENTER): Payer: BC Managed Care – PPO | Admitting: *Deleted

## 2023-08-13 VITALS — Ht 63.0 in | Wt 202.0 lb

## 2023-08-13 DIAGNOSIS — Z1211 Encounter for screening for malignant neoplasm of colon: Secondary | ICD-10-CM

## 2023-08-13 MED ORDER — NA SULFATE-K SULFATE-MG SULF 17.5-3.13-1.6 GM/177ML PO SOLN
1.0000 | Freq: Once | ORAL | 0 refills | Status: AC
Start: 2023-08-13 — End: 2023-08-13

## 2023-08-13 NOTE — Progress Notes (Signed)
Pt's name and DOB verified at the beginning of the pre-visit.  Pt denies any difficulty with ambulating,sitting, laying down or rolling side to side Gave both [facility] main # and MD on call # prior to instructions.  No egg or soy allergy known to patient  Pt has PONV and slow to wake  Pt denies having issues being intubated Patient denies ever being intubated Pt has no issues moving head neck or swallowing No FH of Malignant Hyperthermia Pt is not on diet pills Pt is not on home 02  Pt is not on blood thinners  Pt has frequent issues with constipation RN instructed pt to use Miralax per bottles instructions a week before prep days. 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  Pre-visit completed and red dot placed by patient's name on their procedure day (on provider's schedule).  . Visit by phone Pt states weight is 202 lb Instructed pt why it is important to and  to call if they have any changes in health or new medications. Directed them to the # given and on instructions.   Pt states they will.  Instructions reviewed with pt and pt states understanding. Instructed to review again prior to procedure. Pt states they will.  Instructions sent by mail with coupon and by my chart

## 2023-08-13 NOTE — Telephone Encounter (Signed)
LMTCB. Form is completed but need to know if she has had Hep B vaccines. If so where she got them. Do not have record of this.

## 2023-08-15 NOTE — Telephone Encounter (Signed)
Form faxed. Per note, due today.

## 2023-09-01 ENCOUNTER — Encounter: Payer: Self-pay | Admitting: Gastroenterology

## 2023-09-04 ENCOUNTER — Encounter (HOSPITAL_COMMUNITY): Payer: Self-pay | Admitting: Gastroenterology

## 2023-09-04 NOTE — Progress Notes (Signed)
Attempted to obtain medical history for pre op call via telephone, unable to reach at this time. HIPAA compliant voicemail message left requesting return call to pre surgical testing department.

## 2023-09-11 ENCOUNTER — Ambulatory Visit (HOSPITAL_COMMUNITY): Payer: BC Managed Care – PPO | Admitting: Anesthesiology

## 2023-09-11 ENCOUNTER — Encounter (HOSPITAL_COMMUNITY)
Admission: RE | Disposition: A | Discharge status: Home / Self Care | Payer: Self-pay | Source: Home / Self Care | Attending: Gastroenterology

## 2023-09-11 ENCOUNTER — Ambulatory Visit (HOSPITAL_COMMUNITY)
Admission: RE | Admit: 2023-09-11 | Discharge: 2023-09-11 | Disposition: A | Discharge status: Home / Self Care | Payer: BC Managed Care – PPO | Attending: Gastroenterology | Admitting: Gastroenterology

## 2023-09-11 ENCOUNTER — Encounter (HOSPITAL_COMMUNITY): Payer: Self-pay | Admitting: Gastroenterology

## 2023-09-11 DIAGNOSIS — Z1211 Encounter for screening for malignant neoplasm of colon: Secondary | ICD-10-CM

## 2023-09-11 DIAGNOSIS — J45909 Unspecified asthma, uncomplicated: Secondary | ICD-10-CM | POA: Diagnosis not present

## 2023-09-11 DIAGNOSIS — K219 Gastro-esophageal reflux disease without esophagitis: Secondary | ICD-10-CM | POA: Diagnosis not present

## 2023-09-11 DIAGNOSIS — N189 Chronic kidney disease, unspecified: Secondary | ICD-10-CM | POA: Diagnosis not present

## 2023-09-11 DIAGNOSIS — D123 Benign neoplasm of transverse colon: Secondary | ICD-10-CM

## 2023-09-11 DIAGNOSIS — Z860101 Personal history of adenomatous and serrated colon polyps: Secondary | ICD-10-CM

## 2023-09-11 DIAGNOSIS — K644 Residual hemorrhoidal skin tags: Secondary | ICD-10-CM | POA: Diagnosis not present

## 2023-09-11 DIAGNOSIS — D125 Benign neoplasm of sigmoid colon: Secondary | ICD-10-CM

## 2023-09-11 DIAGNOSIS — K648 Other hemorrhoids: Secondary | ICD-10-CM | POA: Diagnosis not present

## 2023-09-11 DIAGNOSIS — D122 Benign neoplasm of ascending colon: Secondary | ICD-10-CM | POA: Insufficient documentation

## 2023-09-11 HISTORY — PX: HEMOSTASIS CLIP PLACEMENT: SHX6857

## 2023-09-11 HISTORY — PX: POLYPECTOMY: SHX5525

## 2023-09-11 HISTORY — PX: COLONOSCOPY WITH PROPOFOL: SHX5780

## 2023-09-11 HISTORY — PX: SUBMUCOSAL LIFTING INJECTION: SHX6855

## 2023-09-11 HISTORY — PX: ENDOSCOPIC MUCOSAL RESECTION: SHX6839

## 2023-09-11 SURGERY — COLONOSCOPY WITH PROPOFOL
Anesthesia: Monitor Anesthesia Care

## 2023-09-11 MED ORDER — FLEET ENEMA RE ENEM
ENEMA | RECTAL | Status: DC | PRN
  Administered 2023-09-11: 1 via RECTAL

## 2023-09-11 MED ORDER — PROPOFOL 500 MG/50ML IV EMUL
INTRAVENOUS | Status: DC | PRN
  Administered 2023-09-11: 30 mg via INTRAVENOUS
  Administered 2023-09-11: 200 ug/kg/min via INTRAVENOUS

## 2023-09-11 MED ORDER — FLEET ENEMA RE ENEM
ENEMA | RECTAL | Status: AC
  Filled 2023-09-11: qty 1

## 2023-09-11 MED ORDER — LIDOCAINE HCL (CARDIAC) PF 100 MG/5ML IV SOSY
PREFILLED_SYRINGE | INTRAVENOUS | Status: DC | PRN
  Administered 2023-09-11: 80 mg via INTRATRACHEAL

## 2023-09-11 MED ORDER — SODIUM CHLORIDE 0.9 % IV SOLN: INTRAVENOUS | Status: DC

## 2023-09-11 SURGICAL SUPPLY — 22 items

## 2023-09-11 NOTE — H&P (Signed)
[facility] History and Physical   Primary Care Physician:  Dale Heyworth, MD   Reason for Procedure:  Large sessile adenomatous colon polyp  Plan:    colonoscopy with EMR and interventions as needed     HPI: Carrie [name] is a very pleasant 47 y.o. female here for colonoscopy for excision of large colon polyp with EMR.  The risks and benefits as well as alternatives of endoscopic procedure(s) have been discussed and reviewed. All questions answered. The patient agrees to proceed.    Past Medical History:  Diagnosis Date   Abnormal Pap smear    Allergy    Anemia    Anxiety    Arthritis    t-12   Asthma    exercised induced   Chronic kidney disease    1998 Mono kidney   Depression    GERD (gastroesophageal reflux disease)    Headache(784.0)    History of sensory changes 08/09/2020   Mononucleosis 1998   Oxygen deficiency    PONV (postoperative nausea and vomiting)    takes awhile to wake up   Seizures (HCC)    UTI (urinary tract infection)     Past Surgical History:  Procedure Laterality Date   CARPAL TUNNEL RELEASE Bilateral 2016   CESAREAN SECTION N/A 10/30/2013   Procedure: CESAREAN SECTION;  Surgeon: Meriel Pica, MD;  Location: [facility];  Service: Obstetrics;  Laterality: N/A;  PRIMARY   CHOLECYSTECTOMY N/A 10/24/2017   Procedure: LAPAROSCOPIC CHOLECYSTECTOMY;  Surgeon: Lattie Haw, MD;  Location: [facility];  Service: General;  Laterality: N/A;   COLPOSCOPY W/ BIOPSY / CURETTAGE     DE QUERVAIN'S RELEASE Left 2016   DILATION AND CURETTAGE OF UTERUS     FOOT FRACTURE SURGERY Left    HAND SURGERY Left    Fracture repair   IVF      Prior to Admission medications   Medication Sig Start Date End Date Taking? Authorizing Provider  acetaminophen (TYLENOL) 500 MG tablet Take 1,000-1,500 mg by mouth every 6 (six) hours as needed for moderate pain or headache.    Yes [provider]  amphetamine-dextroamphetamine (ADDERALL) 20 MG  tablet Take 20 mg by mouth 2 (two) times daily. 04/13/23  Yes [provider]  CALCIUM PO Take 1 tablet by mouth daily.   Yes [provider]  famotidine (PEPCID) 10 MG tablet Take 10 mg by mouth at bedtime. Only in the morning.   Yes [provider]  ferrous sulfate 325 (65 FE) MG EC tablet Take 325 mg by mouth daily.   Yes [provider]  fluticasone (FLONASE) 50 MCG/ACT nasal spray PLACE 1 SPRAY INTO BOTH NOSTRILS AT BEDTIME. 12/13/19  Yes Dale [place], MD  ibuprofen (ADVIL,MOTRIN) 200 MG tablet Take 600 mg by mouth every 6 (six) hours as needed for headache or moderate pain.   Yes [provider]  magnesium oxide (MAG-OX) 400 (241.3 Mg) MG tablet TAKE 1 TABLET EVERY DAY 10/07/16  Yes Dale [place], MD  pantoprazole (PROTONIX) 40 MG tablet TAKE 1 TABLET BY MOUTH EVERY DAY 06/16/23  Yes Dale , MD  TRINTELLIX 10 MG TABS tablet Take by mouth. Takes 20 mg QD 07/25/22  Yes [provider]  Fremanezumab-vfrm (AJOVY) 225 MG/1.5ML SOAJ Inject 225 mg into the skin every 30 (thirty) days. Patient not taking: Reported on 08/13/2023 06/27/21   [provider]  levalbuterol Pauline Aus HFA) 45 MCG/ACT inhaler Inhale 1-2 puffs into the lungs every 6 (six) hours as needed for  wheezing. Patient not taking: Reported on 08/13/2023 08/18/22   Becky Augusta, NP  Omega-3 Fatty Acids (FISH OIL BURP-LESS PO) Take 1 capsule by mouth daily.  Patient not taking: Reported on 08/13/2023    [provider]  propranolol (INDERAL) 20 MG tablet Take 1 tablet (20 mg total) by mouth 3 (three) times daily as needed. 05/20/18   Antonieta Iba, MD  Spacer/Aero-Holding Chambers (AEROCHAMBER MV) inhaler Use as instructed 08/18/22   Becky Augusta, NP  SUMAtriptan (IMITREX) 100 MG tablet Take 100 mg by mouth as needed. 06/27/21   [provider]  Ubrogepant (UBRELVY) 100 MG TABS Take by mouth. Patient not taking: Reported on 08/13/2023 05/05/20   [provider]    Current Facility-Administered Medications  Medication Dose Route Frequency Provider Last Rate Last Admin   0.9 %  sodium chloride infusion   Intravenous Continuous Benny Henrie, Eleonore Chiquito, MD       sodium phosphate (FLEET) enema    PRN Napoleon Form, MD   1 enema at 09/11/23 0736    Allergies as of 05/22/2023 - Review Complete 05/20/2023  Allergen Reaction Noted   Citalopram Hives, Itching, and Rash 05/09/2019    Family History  Problem Relation Age of Onset   Asthma Mother    Arthritis Mother    Diverticulitis Mother    Thyroid disease Mother    GER disease Mother    Migraines Mother    Arthritis Father    Hypertension Father    Hyperlipidemia Father    Thyroid disease Father    Allergic rhinitis Sister    Anemia Sister    Thyroid disease Sister    Thyroid disease Sister    Bipolar disorder Sister    Arrhythmia Sister        Ventricular Tachycardia   Healthy Brother    Diabetes Maternal Grandmother    Heart attack Maternal Grandmother    Alzheimer's disease Maternal Grandmother    Emphysema Maternal Grandfather    Breast cancer Paternal Grandmother    Heart disease Paternal Grandfather    Heart attack Paternal Grandfather    Liver disease Neg Hx    Esophageal cancer Neg Hx    Colon cancer Neg Hx    Crohn's disease Neg Hx    Rectal cancer Neg Hx    Stomach cancer Neg Hx     Social History   Socioeconomic History   Marital status: Married    Spouse name: Not on file   Number of children: 1   Years of education: Master's   Highest education level: Master's degree (e.g., MA, MS, MEng, MEd, MSW, MBA)  Occupational History   Occupation: Part-time Runner, broadcasting/film/video    Comment: Facilities manager  Tobacco Use   Smoking status: Never   Smokeless tobacco: Never  Vaping Use   Vaping status: Never Used  Substance and Sexual Activity   Alcohol use: No   Drug use: No   Sexual activity: Not Currently    Birth control/protection: None    Comment:  couple is afraid  to mess something up  Other Topics Concern   Not on file  Social History Narrative   Not on file   Social Determinants of Health   Financial Resource Strain: Low Risk  (07/27/2023)   Overall Financial Resource Strain (CARDIA)    Difficulty of Paying Living Expenses: Not hard at all  Food Insecurity: No Food Insecurity (07/27/2023)   Hunger Vital Sign    Worried About Radiation protection practitioner of Food  in the Last Year: Never true    Ran Out of Food in the Last Year: Never true  Transportation Needs: No Transportation Needs (07/27/2023)   PRAPARE - Administrator, Civil Service (Medical): No    Lack of Transportation (Non-Medical): No  Physical Activity: Insufficiently Active (07/27/2023)   Exercise Vital Sign    Days of Exercise per Week: 1 day    Minutes of Exercise per Session: 10 min  Stress: No Stress Concern Present (07/27/2023)   [employer] of Occupational Health - Occupational Stress Questionnaire    Feeling of Stress : Only a little  Social Connections: Socially Isolated (07/27/2023)   Social Connection and Isolation Panel [NHANES]    Frequency of Communication with Friends and Family: Once a week    Frequency of Social Gatherings with Friends and Family: Never    Attends Religious Services: Never    Database administrator or Organizations: No    Attends Engineer, structural: Not on file    Marital Status: Married  Catering manager Violence: Not on file    Review of Systems:  All other review of systems negative except as mentioned in the HPI.  Physical Exam: Vital signs in last 24 hours: BP 107/83   Pulse 80   Temp 98.2 F (36.8 C) (Temporal)   Resp 17   Ht 5\' 3"  (1.6 m)   Wt 90.7 kg   LMP 08/18/2023 (Approximate) Comment: Pt states no chance of pregnancy.  SpO2 100%   BMI 35.43 kg/m  General:   Alert, NAD Lungs:  Clear .   Heart:  Regular rate and rhythm Abdomen:  Soft, nontender and nondistended. Neuro/Psych:  Alert and  cooperative. Normal mood and affect. A and O x 3  Reviewed labs, radiology imaging, old records and pertinent past GI work up  Patient is appropriate for planned procedure(s) and anesthesia in an ambulatory setting   K. Scherry Ran , MD [phone]

## 2023-09-11 NOTE — Op Note (Addendum)
[facility] Patient Name: Carrie [name] Procedure Date: 09/11/2023 MRN: [id number] Attending MD: Napoleon Form , MD, [id number] Date of Birth: 07-20-1976 CSN: [id number] Age: 47 Admit Type: Outpatient Procedure:                Colonoscopy Indications:              Excision of colonic polyp Providers:                Napoleon Form, MD, Suzy Bouchard, RN, Doristine Mango, RN, Geoffery Lyons, Technician, Marja Kays, Technician Referring MD:              Medicines:                Monitored Anesthesia Care Complications:            No immediate complications. Estimated Blood Loss:     Estimated blood loss was minimal. Procedure:                Pre-Anesthesia Assessment:                           - Prior to the procedure, a History and Physical                            was performed, and patient medications and                            allergies were reviewed. The patient's tolerance of                            previous anesthesia was also reviewed. The risks                            and benefits of the procedure and the sedation                            options and risks were discussed with the patient.                            All questions were answered, and informed consent                            was obtained. Prior Anticoagulants: The patient has                            taken no anticoagulant or antiplatelet agents. ASA                            Grade Assessment: II - A patient with mild systemic  disease. After reviewing the risks and benefits,                            the patient was deemed in satisfactory condition to                            undergo the procedure.                           After obtaining informed consent, the colonoscope                            was passed under direct vision. Throughout the                            procedure,  the patient's blood pressure, pulse, and                            oxygen saturations were monitored continuously. The                            [id number]-HQ190L ([id number]) Olympus colonoscope was                            introduced through the anus and advanced to the the                            cecum, identified by appendiceal orifice and                            ileocecal valve. The colonoscopy was performed                            without difficulty. The patient tolerated the                            procedure well. The quality of the bowel                            preparation was good. The ileocecal valve,                            appendiceal orifice, and rectum were photographed. Scope In: 8:17:48 AM Scope Out: 8:59:33 AM Scope Withdrawal Time: 0 hours 37 minutes 12 seconds  Total Procedure Duration: 0 hours 41 minutes 45 seconds  Findings:      The perianal and digital rectal examinations were normal.      A 20 mm polyp was found in the ascending colon. The polyp was       mucous-capped. Preparations were made for mucosal resection. Demarcation       of the lesion was performed with high-definition white light and narrow       band imaging to clearly identify the boundaries of the lesion. 5 mL of       EverLift was injected with adequate lift of the lesion from the  muscularis propria. Snare mucosal resection with suction (via the       working channel) retrieval was performed. A 15 mm area was resected.       Resection and retrieval were complete. Resected tissue margins were       examined and clear of polyp tissue. There was no bleeding at the end of       the procedure. To close a defect after mucosal resection, four       hemostatic clips were successfully placed (MR safe). Clip manufacturer:       [employer]. There was no bleeding at the end of the procedure.      Non-bleeding external and internal hemorrhoids were found during       retroflexion. The  hemorrhoids were medium-sized. Impression:               - One 20 mm polyp in the ascending colon, removed                            with mucosal resection. Resected and retrieved.                            Clip manufacturer: [employer]. Clips (MR                            safe) were placed.                           - Non-bleeding external and internal hemorrhoids.                           - Mucosal resection was performed. Resection and                            retrieval were complete. Moderate Sedation:      N/A Recommendation:           - Patient has a contact number available for                            emergencies. The signs and symptoms of potential                            delayed complications were discussed with the                            patient. Return to normal activities tomorrow.                            Written discharge instructions were provided to the                            patient.                           - Resume previous diet.                           - Continue present medications.                           -  Await pathology results.                           - Repeat colonoscopy date to be determined after                            pending pathology results are reviewed for                            surveillance based on pathology results. To be                            scheduled in [facility] in 1-3 years Procedure Code(s):        --- Professional ---                           [phone], Colonoscopy, flexible; with endoscopic                            mucosal resection Diagnosis Code(s):        --- Professional ---                           D12.2, Benign neoplasm of ascending colon                           K64.8, Other hemorrhoids                           K63.5, Polyp of colon CPT copyright 2022 American Medical Association. All rights reserved. The codes documented in this report are preliminary and upon coder review may  be revised  to meet current compliance requirements. Napoleon Form, MD 09/11/2023 9:17:47 AM This report has been signed electronically. Number of Addenda: 0

## 2023-09-11 NOTE — Discharge Instructions (Signed)

## 2023-09-11 NOTE — Anesthesia Preprocedure Evaluation (Signed)
Anesthesia Evaluation  Patient identified by MRN, date of birth, ID band Patient awake    Reviewed: Allergy & Precautions, NPO status , Patient's Chart, lab work & pertinent test results  History of Anesthesia Complications (+) PONV and history of anesthetic complications  Airway Mallampati: I       Dental no notable dental hx.    Pulmonary asthma    Pulmonary exam normal        Cardiovascular negative cardio ROS Normal cardiovascular exam     Neuro/Psych  Headaches    GI/Hepatic ,GERD  Medicated and Controlled,,  Endo/Other    Renal/GU Renal InsufficiencyRenal disease     Musculoskeletal   Abdominal  (+) + obese  Peds  Hematology   Anesthesia Other Findings   Reproductive/Obstetrics                              Anesthesia Physical Anesthesia Plan  ASA: 2  Anesthesia Plan: MAC   Post-op Pain Management:    Induction:   PONV Risk Score and Plan: Propofol infusion and TIVA  Airway Management Planned: Natural Airway, Simple Face Mask and Nasal Cannula  Additional Equipment: None  Intra-op Plan:   Post-operative Plan:   Informed Consent: I have reviewed the patients History and Physical, chart, labs and discussed the procedure including the risks, benefits and alternatives for the proposed anesthesia with the patient or authorized representative who has indicated his/her understanding and acceptance.       Plan Discussed with: CRNA  Anesthesia Plan Comments:          Anesthesia Quick Evaluation

## 2023-09-11 NOTE — Transfer of Care (Signed)
Immediate Anesthesia Transfer of Care Note  Patient: Carrie [name]  Procedure(s) Performed: COLONOSCOPY WITH PROPOFOL ENDOSCOPIC MUCOSAL RESECTION POLYPECTOMY SUBMUCOSAL LIFTING INJECTION HEMOSTASIS CLIP PLACEMENT  Patient Location: PACU  Anesthesia Type:MAC  Level of Consciousness: sedated  Airway & Oxygen Therapy: Patient Spontanous Breathing and Patient connected to face mask  Post-op Assessment: Report given to RN  Post vital signs: Reviewed and stable  Last Vitals:  Vitals Value Taken Time  BP 97/58 09/11/23 0905  Temp    Pulse 63 09/11/23 0906  Resp 13 09/11/23 0906  SpO2 100 % 09/11/23 0906  Vitals shown include unfiled device data.  Last Pain:  Vitals:   09/11/23 0732  TempSrc: Temporal  PainSc: 6          Complications: No notable events documented.

## 2023-09-11 NOTE — Anesthesia Postprocedure Evaluation (Signed)
Anesthesia Post Note  Patient: Karrigan Sugawara  Procedure(s) Performed: COLONOSCOPY WITH PROPOFOL ENDOSCOPIC MUCOSAL RESECTION POLYPECTOMY SUBMUCOSAL LIFTING INJECTION HEMOSTASIS CLIP PLACEMENT     Patient location during evaluation: Endoscopy Anesthesia Type: MAC Level of consciousness: awake Pain management: pain level controlled Vital Signs Assessment: post-procedure vital signs reviewed and stable Respiratory status: spontaneous breathing Cardiovascular status: stable Postop Assessment: no apparent nausea or vomiting Anesthetic complications: no  No notable events documented.  Last Vitals:  Vitals:   09/11/23 0930 09/11/23 0940  BP: (!) 122/59 119/69  Pulse: 70 (!) 58  Resp: 18 15  Temp:    SpO2: 100% 100%    Last Pain:  Vitals:   09/11/23 0940  TempSrc:   PainSc: 0-No pain                 Caren Macadam

## 2023-09-12 ENCOUNTER — Encounter (HOSPITAL_COMMUNITY): Payer: Self-pay | Admitting: Gastroenterology

## 2023-09-12 LAB — SURGICAL PATHOLOGY

## 2023-09-15 ENCOUNTER — Encounter: Payer: Self-pay | Admitting: Gastroenterology

## 2023-11-11 ENCOUNTER — Other Ambulatory Visit: Payer: Self-pay | Admitting: Infectious Diseases

## 2023-11-11 MED ORDER — AMOXICILLIN-POT CLAVULANATE 875-125 MG PO TABS: 1.0000 | ORAL_TABLET | Freq: Two times a day (BID) | ORAL | 0 refills | Status: AC

## 2023-11-27 ENCOUNTER — Encounter: Payer: Self-pay | Admitting: Internal Medicine

## 2023-11-27 ENCOUNTER — Ambulatory Visit: Payer: 59 | Admitting: Internal Medicine

## 2023-11-27 VITALS — BP 118/72 | HR 84 | Temp 98.0°F | Resp 16 | Ht 63.0 in | Wt 200.0 lb

## 2023-11-27 DIAGNOSIS — E611 Iron deficiency: Secondary | ICD-10-CM

## 2023-11-27 DIAGNOSIS — K219 Gastro-esophageal reflux disease without esophagitis: Secondary | ICD-10-CM

## 2023-11-27 DIAGNOSIS — E538 Deficiency of other specified B group vitamins: Secondary | ICD-10-CM | POA: Diagnosis not present

## 2023-11-27 DIAGNOSIS — G43019 Migraine without aura, intractable, without status migrainosus: Secondary | ICD-10-CM

## 2023-11-27 DIAGNOSIS — F419 Anxiety disorder, unspecified: Secondary | ICD-10-CM

## 2023-11-27 DIAGNOSIS — R059 Cough, unspecified: Secondary | ICD-10-CM

## 2023-11-27 DIAGNOSIS — E7800 Pure hypercholesterolemia, unspecified: Secondary | ICD-10-CM | POA: Diagnosis not present

## 2023-11-27 LAB — BASIC METABOLIC PANEL
BUN: 15 mg/dL (ref 6–23)
CO2: 26 meq/L (ref 19–32)
Calcium: 9.3 mg/dL (ref 8.4–10.5)
Chloride: 103 meq/L (ref 96–112)
Creatinine, Ser: 0.68 mg/dL (ref 0.40–1.20)
GFR: 104.11 mL/min (ref >60.00)
Glucose, Bld: 92 mg/dL (ref 70–99)
Potassium: 4.2 meq/L (ref 3.5–5.1)
Sodium: 137 meq/L (ref 135–145)

## 2023-11-27 LAB — HEPATIC FUNCTION PANEL
ALT: 21 U/L (ref 0–35)
AST: 20 U/L (ref 0–37)
Albumin: 4.4 g/dL (ref 3.5–5.2)
Alkaline Phosphatase: 59 U/L (ref 39–117)
Bilirubin, Direct: 0.1 mg/dL (ref 0.0–0.3)
Total Bilirubin: 0.6 mg/dL (ref 0.2–1.2)
Total Protein: 7.3 g/dL (ref 6.0–8.3)

## 2023-11-27 LAB — CBC WITH DIFFERENTIAL/PLATELET
Basophils Absolute: 0 10*3/uL (ref 0.0–0.1)
Basophils Relative: 0.7 % (ref 0.0–3.0)
Eosinophils Absolute: 0.1 10*3/uL (ref 0.0–0.7)
Eosinophils Relative: 1.2 % (ref 0.0–5.0)
HCT: 39.1 % (ref 36.0–46.0)
Hemoglobin: 12.9 g/dL (ref 12.0–15.0)
Lymphocytes Relative: 31.7 % (ref 12.0–46.0)
Lymphs Abs: 2.2 10*3/uL (ref 0.7–4.0)
MCHC: 32.9 g/dL (ref 30.0–36.0)
MCV: 88.4 fL (ref 78.0–100.0)
Monocytes Absolute: 0.6 10*3/uL (ref 0.1–1.0)
Monocytes Relative: 7.8 % (ref 3.0–12.0)
Neutro Abs: 4.1 10*3/uL (ref 1.4–7.7)
Neutrophils Relative %: 58.6 % (ref 43.0–77.0)
Platelets: 379 10*3/uL (ref 150.0–400.0)
RBC: 4.42 Mil/uL (ref 3.87–5.11)
RDW: 13.8 % (ref 11.5–15.5)
WBC: 7.1 10*3/uL (ref 4.0–10.5)

## 2023-11-27 LAB — LIPID PANEL
Cholesterol: 216 mg/dL — ABNORMAL HIGH (ref 0–200)
HDL: 53.1 mg/dL (ref >39.00)
LDL Cholesterol: 139 mg/dL — ABNORMAL HIGH (ref 0–99)
NonHDL: 162.97
Total CHOL/HDL Ratio: 4
Triglycerides: 122 mg/dL (ref 0.0–149.0)
VLDL: 24.4 mg/dL (ref 0.0–40.0)

## 2023-11-27 LAB — IBC + FERRITIN
Ferritin: 25.1 ng/mL (ref 10.0–291.0)
Iron: 141 ug/dL (ref 42–145)
Saturation Ratios: 33.7 % (ref 20.0–50.0)
TIBC: 418.6 ug/dL (ref 250.0–450.0)
Transferrin: 299 mg/dL (ref 212.0–360.0)

## 2023-11-27 LAB — VITAMIN B12: Vitamin B-12: 332 pg/mL (ref 211–911)

## 2023-11-27 MED ORDER — PANTOPRAZOLE SODIUM 40 MG PO TBEC: 40.0000 mg | DELAYED_RELEASE_TABLET | Freq: Every day | ORAL | 3 refills | Status: DC

## 2023-11-27 MED ORDER — PREDNISONE 10 MG PO TABS: ORAL_TABLET | ORAL | 0 refills | Status: DC

## 2023-11-27 MED ORDER — AZITHROMYCIN 250 MG PO TABS: ORAL_TABLET | ORAL | 0 refills | Status: AC

## 2023-11-27 NOTE — Progress Notes (Signed)
Subjective:    Patient ID: Carrie [name], female    DOB: 02-08-1976, 48 y.o.   MRN: [id number]  Patient here for  Chief Complaint  Patient presents with   Medical Management of Chronic Issues    HPI Here for a follow up appt. Here to follow up regarding hypercholesterolemia and GERD. Increased stress. Seeing Dr Maryruth Bun. On Trintellix. Appears to be handling things relatively well. Saw GI 03/21/23 - recommended EGD and colonoscopy. EGD 05/20/23- few gastric polyps. Colonoscopy - two 8-10 mm polyps in the sigmoid colon and at the hepatic flexure, diverticulosis, one 25mm polyp in the ascending colon and non bleeding external and internal hemorrhoids. F/u colonoscopy 09/11/23 - one 20 mm polyp in the ascending colon, non bleeding external and internal hemorrhoids. Recommended for her to be checked for celiac. Went on a cruise - returned 12/29 - started with cough. Has had subjective fever.  Increased congestion and increased cough. Some coughing fits. Raspy voice. Nasal congestion. No significant drainage. Using rescue inhaler prn.    Past Medical History:  Diagnosis Date   Abnormal Pap smear    Allergy    Anemia    Anxiety    Arthritis    t-12   Asthma    exercised induced   Chronic kidney disease    1998 Mono kidney   Depression    GERD (gastroesophageal reflux disease)    Headache(784.0)    History of sensory changes 08/09/2020   Mononucleosis 1998   Oxygen deficiency    PONV (postoperative nausea and vomiting)    takes awhile to wake up   Seizures (HCC)    UTI (urinary tract infection)    Past Surgical History:  Procedure Laterality Date   CARPAL TUNNEL RELEASE Bilateral 2016   CESAREAN SECTION N/A 10/30/2013   Procedure: CESAREAN SECTION;  Surgeon: Meriel Pica, MD;  Location: [facility];  Service: Obstetrics;  Laterality: N/A;  PRIMARY   CHOLECYSTECTOMY N/A 10/24/2017   Procedure: LAPAROSCOPIC CHOLECYSTECTOMY;  Surgeon: Lattie Haw, MD;  Location: [facility];  Service:  General;  Laterality: N/A;   COLONOSCOPY WITH PROPOFOL N/A 09/11/2023   Procedure: COLONOSCOPY WITH PROPOFOL;  Surgeon: Napoleon Form, MD;  Location: [facility];  Service: Gastroenterology;  Laterality: N/A;   COLPOSCOPY W/ BIOPSY / CURETTAGE     DE QUERVAIN'S RELEASE Left 2016   DILATION AND CURETTAGE OF UTERUS     ENDOSCOPIC MUCOSAL RESECTION N/A 09/11/2023   Procedure: ENDOSCOPIC MUCOSAL RESECTION;  Surgeon: Napoleon Form, MD;  Location: [facility];  Service: Gastroenterology;  Laterality: N/A;   FOOT FRACTURE SURGERY Left    HAND SURGERY Left    Fracture repair   HEMOSTASIS CLIP PLACEMENT  09/11/2023   Procedure: HEMOSTASIS CLIP PLACEMENT;  Surgeon: Napoleon Form, MD;  Location: [facility];  Service: Gastroenterology;;   IVF     POLYPECTOMY  09/11/2023   Procedure: POLYPECTOMY;  Surgeon: Napoleon Form, MD;  Location: [facility];  Service: Gastroenterology;;   SUBMUCOSAL LIFTING INJECTION  09/11/2023   Procedure: SUBMUCOSAL LIFTING INJECTION;  Surgeon: Napoleon Form, MD;  Location: [facility];  Service: Gastroenterology;;   Family History  Problem Relation Age of Onset   Asthma Mother    Arthritis Mother    Diverticulitis Mother    Thyroid disease Mother    GER disease Mother    Migraines Mother    Arthritis Father    Hypertension Father    Hyperlipidemia Father    Thyroid disease Father  Allergic rhinitis Sister    Anemia Sister    Thyroid disease Sister    Thyroid disease Sister    Bipolar disorder Sister    Arrhythmia Sister        Ventricular Tachycardia   Healthy Brother    Diabetes Maternal Grandmother    Heart attack Maternal Grandmother    Alzheimer's disease Maternal Grandmother    Emphysema Maternal Grandfather    Breast cancer Paternal Grandmother    Heart disease Paternal Grandfather    Heart attack Paternal Grandfather    Liver disease Neg Hx    Esophageal cancer Neg Hx    Colon cancer Neg Hx    Crohn's disease  Neg Hx    Rectal cancer Neg Hx    Stomach cancer Neg Hx    Social History   Socioeconomic History   Marital status: Married    Spouse name: Not on file   Number of children: 1   Years of education: Master's   Highest education level: Master's degree (e.g., MA, MS, MEng, MEd, MSW, MBA)  Occupational History   Occupation: Air cabin crew    Comment: Facilities manager  Tobacco Use   Smoking status: Never   Smokeless tobacco: Never  Vaping Use   Vaping status: Never Used  Substance and Sexual Activity   Alcohol use: No   Drug use: No   Sexual activity: Not Currently    Birth control/protection: None    Comment: couple is afraid  to mess something up  Other Topics Concern   Not on file  Social History Narrative   Not on file   Social Drivers of Health   Financial Resource Strain: Low Risk  (11/26/2023)   Overall Financial Resource Strain (CARDIA)    Difficulty of Paying Living Expenses: Not hard at all  Food Insecurity: No Food Insecurity (11/26/2023)   Hunger Vital Sign    Worried About Running Out of Food in the Last Year: Never true    Ran Out of Food in the Last Year: Never true  Transportation Needs: No Transportation Needs (11/26/2023)   PRAPARE - Administrator, Civil Service (Medical): No    Lack of Transportation (Non-Medical): No  Physical Activity: Insufficiently Active (11/26/2023)   Exercise Vital Sign    Days of Exercise per Week: 1 day    Minutes of Exercise per Session: 10 min  Stress: No Stress Concern Present (11/26/2023)   [employer] of Occupational Health - Occupational Stress Questionnaire    Feeling of Stress : Only a little  Social Connections: Moderately Integrated (11/26/2023)   Social Connection and Isolation Panel [NHANES]    Frequency of Communication with Friends and Family: Twice a week    Frequency of Social Gatherings with Friends and Family: Once a week    Attends Religious Services: Never    Database administrator or  Organizations: Yes    Attends Banker Meetings: 1 to 4 times per year    Marital Status: Married     Review of Systems  Constitutional:  Negative for appetite change and unexpected weight change.  HENT:  Positive for congestion, postnasal drip and sore throat.        Raspy voice.   Respiratory:  Positive for cough. Negative for chest tightness and shortness of breath.   Cardiovascular:  Negative for chest pain, palpitations and leg swelling.  Gastrointestinal:  Negative for abdominal pain, diarrhea, nausea and vomiting.  Genitourinary:  Negative for difficulty urinating and dysuria.  Musculoskeletal:  Negative for joint swelling and myalgias.  Skin:  Negative for color change and rash.  Neurological:  Negative for dizziness and headaches.  Psychiatric/Behavioral:  Negative for agitation and dysphoric mood.        Objective:     BP 118/72   Pulse 84   Temp 98 F (36.7 C)   Resp 16   Ht 5\' 3"  (1.6 m)   Wt 200 lb (90.7 kg)   SpO2 98%   BMI 35.43 kg/m  Wt Readings from Last 3 Encounters:  11/27/23 200 lb (90.7 kg)  09/11/23 200 lb (90.7 kg)  08/13/23 202 lb (91.6 kg)    Physical Exam Vitals reviewed.  Constitutional:      General: She is not in acute distress.    Appearance: Normal appearance.  HENT:     Head: Normocephalic and atraumatic.     Right Ear: External ear normal.     Left Ear: External ear normal.     Mouth/Throat:     Pharynx: No oropharyngeal exudate or posterior oropharyngeal erythema.  Eyes:     General: No scleral icterus.       Right eye: No discharge.        Left eye: No discharge.     Conjunctiva/sclera: Conjunctivae normal.  Neck:     Thyroid: No thyromegaly.  Cardiovascular:     Rate and Rhythm: Normal rate and regular rhythm.  Pulmonary:     Effort: No respiratory distress.     Breath sounds: Normal breath sounds. No wheezing.  Abdominal:     General: Bowel sounds are normal.     Palpations: Abdomen is soft.      Tenderness: There is no abdominal tenderness.  Musculoskeletal:        General: No swelling or tenderness.     Cervical back: Neck supple. No tenderness.  Lymphadenopathy:     Cervical: No cervical adenopathy.  Skin:    Findings: No erythema or rash.  Neurological:     Mental Status: She is alert.  Psychiatric:        Mood and Affect: Mood normal.        Behavior: Behavior normal.         Outpatient Encounter Medications as of 11/27/2023  Medication Sig   azithromycin (ZITHROMAX) 250 MG tablet Take 2 tablets on day 1, then 1 tablet daily on days 2 through 5   predniSONE (DELTASONE) 10 MG tablet Take 6 tablets x 1 day and then decrease by 1/2 tablet per day until down to zero mg   acetaminophen (TYLENOL) 500 MG tablet Take 1,000-1,500 mg by mouth every 6 (six) hours as needed for moderate pain or headache.    amphetamine-dextroamphetamine (ADDERALL) 20 MG tablet Take 20 mg by mouth 2 (two) times daily.   CALCIUM PO Take 1 tablet by mouth daily.   famotidine (PEPCID) 10 MG tablet Take 10 mg by mouth at bedtime. Only in the morning.   ferrous sulfate 325 (65 FE) MG EC tablet Take 325 mg by mouth daily.   fluticasone (FLONASE) 50 MCG/ACT nasal spray PLACE 1 SPRAY INTO BOTH NOSTRILS AT BEDTIME.   Fremanezumab-vfrm (AJOVY) 225 MG/1.5ML SOAJ Inject 225 mg into the skin every 30 (thirty) days. (Patient not taking: Reported on 08/13/2023)   levalbuterol (XOPENEX HFA) 45 MCG/ACT inhaler Inhale 1-2 puffs into the lungs every 6 (six) hours as needed for wheezing. (Patient not taking: Reported on 08/13/2023)   magnesium oxide (MAG-OX) 400 (241.3 Mg) MG tablet  TAKE 1 TABLET EVERY DAY   pantoprazole (PROTONIX) 40 MG tablet Take 1 tablet (40 mg total) by mouth daily.   propranolol (INDERAL) 20 MG tablet Take 1 tablet (20 mg total) by mouth 3 (three) times daily as needed.   Spacer/Aero-Holding Chambers (AEROCHAMBER MV) inhaler Use as instructed   SUMAtriptan (IMITREX) 100 MG tablet Take 100 mg by  mouth as needed.   TRINTELLIX 10 MG TABS tablet Take by mouth. Takes 20 mg QD   Ubrogepant (UBRELVY) 100 MG TABS Take by mouth. (Patient not taking: Reported on 08/13/2023)   [DISCONTINUED] Omega-3 Fatty Acids (FISH OIL BURP-LESS PO) Take 1 capsule by mouth daily.  (Patient not taking: Reported on 08/13/2023)   [DISCONTINUED] pantoprazole (PROTONIX) 40 MG tablet TAKE 1 TABLET BY MOUTH EVERY DAY   No facility-administered encounter medications on file as of 11/27/2023.     Lab Results  Component Value Date   WBC 7.1 11/27/2023   HGB 12.9 11/27/2023   HCT 39.1 11/27/2023   PLT 379.0 11/27/2023   GLUCOSE 92 11/27/2023   CHOL 216 (H) 11/27/2023   TRIG 122.0 11/27/2023   HDL 53.10 11/27/2023   LDLDIRECT 134.0 03/07/2022   LDLCALC 139 (H) 11/27/2023   ALT 21 11/27/2023   AST 20 11/27/2023   NA 137 11/27/2023   K 4.2 11/27/2023   CL 103 11/27/2023   CREATININE 0.68 11/27/2023   BUN 15 11/27/2023   CO2 26 11/27/2023   TSH 1.56 07/28/2023       Assessment & Plan:  Hypercholesteremia Assessment & Plan: Low cholesterol diet and exercise.  Follow lipid panel.   Orders: -     CBC with Differential/Platelet -     Basic metabolic panel -     Hepatic function panel -     Lipid panel  B12 deficiency Assessment & Plan: Check B12 level today.   Orders: -     Vitamin B12  Iron deficiency Assessment & Plan: Check cbc and iron studies.   Orders: -     IBC + Ferritin -     Celiac Disease Antibody Screen  Anxiety Assessment & Plan: Sees Dr Maryruth Bun.  On trintelix.  Follow.     Common migraine with intractable migraine Assessment & Plan: S/p SPG block.  Followed by neurology.     Gastroesophageal reflux disease, unspecified whether esophagitis present Assessment & Plan: EGD as outlined.  Continue PPI   Cough, unspecified type Assessment & Plan: Persistent cough and congestion as outlined. Discussed saline nasal spray and steroid nasal spray. Given persistent symptoms,  treat with zpak as directed. Prednisone taper. Follow. Call with update.    Other orders -     Pantoprazole Sodium; Take 1 tablet (40 mg total) by mouth daily.  Dispense: 90 tablet; Refill: 3 -     Azithromycin; Take 2 tablets on day 1, then 1 tablet daily on days 2 through 5  Dispense: 6 tablet; Refill: 0 -     predniSONE; Take 6 tablets x 1 day and then decrease by 1/2 tablet per day until down to zero mg  Dispense: 39 tablet; Refill: 0     Dale [place], MD

## 2023-11-30 ENCOUNTER — Encounter: Payer: Self-pay | Admitting: Internal Medicine

## 2023-11-30 DIAGNOSIS — R059 Cough, unspecified: Secondary | ICD-10-CM | POA: Insufficient documentation

## 2023-11-30 LAB — CELIAC DISEASE ANTIBODY SCREEN
Antigliadin Abs, IgA: 3 U (ref 0–19)
IgA/Immunoglobulin A, Serum: 218 mg/dL (ref 87–352)

## 2023-11-30 NOTE — Assessment & Plan Note (Signed)
EGD as outlined.  Continue PPI

## 2023-11-30 NOTE — Assessment & Plan Note (Signed)
Check B12 level today.  

## 2023-11-30 NOTE — Assessment & Plan Note (Signed)
Sees Dr Maryruth Bun.  On trintelix.  Follow.

## 2023-11-30 NOTE — Assessment & Plan Note (Signed)
S/p SPG block.  Followed by neurology.

## 2023-11-30 NOTE — Assessment & Plan Note (Signed)
Low cholesterol diet and exercise.  Follow lipid panel.   

## 2023-11-30 NOTE — Assessment & Plan Note (Signed)
Check cbc and iron studies.

## 2023-11-30 NOTE — Assessment & Plan Note (Signed)
Persistent cough and congestion as outlined. Discussed saline nasal spray and steroid nasal spray. Given persistent symptoms, treat with zpak as directed. Prednisone taper. Follow. Call with update.

## 2023-12-03 ENCOUNTER — Encounter: Payer: Self-pay | Admitting: Internal Medicine

## 2023-12-04 NOTE — Telephone Encounter (Signed)
If she is still having increased symptoms, sounds like she needs to be reevaluated.

## 2024-03-29 ENCOUNTER — Ambulatory Visit: Payer: 59 | Admitting: Internal Medicine

## 2024-07-22 ENCOUNTER — Telehealth: Payer: Self-pay | Admitting: Internal Medicine

## 2024-07-22 DIAGNOSIS — Z1231 Encounter for screening mammogram for malignant neoplasm of breast: Secondary | ICD-10-CM

## 2024-07-22 NOTE — Telephone Encounter (Signed)
 I recived notification that she is overdue for mammogram. Need to scheduled.  Thanks.

## 2024-08-01 ENCOUNTER — Telehealth: Payer: Self-pay | Admitting: Internal Medicine

## 2024-08-01 DIAGNOSIS — Z1231 Encounter for screening mammogram for malignant neoplasm of breast: Secondary | ICD-10-CM

## 2024-08-01 NOTE — Telephone Encounter (Signed)
 Received notification that she is overdue a mammogram. Please schedule. Also, she needs an appt scheduled with me (please schedule her for a physical). Thanks.

## 2024-08-02 NOTE — Telephone Encounter (Signed)
 LM for patient. Please relay message to patient and schedule her appointment.

## 2024-08-04 NOTE — Telephone Encounter (Signed)
 LM for patient. Please relay message to patient and schedule her appointment.

## 2024-08-10 NOTE — Telephone Encounter (Signed)
 Closing note. Unable to reach after several attempts.

## 2024-09-13 ENCOUNTER — Encounter: Payer: Self-pay | Admitting: Internal Medicine

## 2024-09-14 ENCOUNTER — Encounter: Payer: Self-pay | Admitting: Infectious Diseases

## 2024-09-14 MED ORDER — PREDNISONE 10 MG PO TABS: ORAL_TABLET | ORAL | 1 refills | Status: DC

## 2024-09-15 NOTE — Telephone Encounter (Signed)
 I can order vitamin D and B12. See if she is also agreeable to check cholesterol, metabolic panel and thyroid  with these labs. Also see if agreeable to schedule a physical with me. If so, will need to schedule physical and lab appt and I can order labs.

## 2024-09-16 ENCOUNTER — Other Ambulatory Visit: Payer: Self-pay | Admitting: *Deleted

## 2024-09-16 DIAGNOSIS — E538 Deficiency of other specified B group vitamins: Secondary | ICD-10-CM

## 2024-09-16 DIAGNOSIS — F32A Depression, unspecified: Secondary | ICD-10-CM

## 2024-09-16 DIAGNOSIS — E7800 Pure hypercholesterolemia, unspecified: Secondary | ICD-10-CM

## 2024-09-17 NOTE — Progress Notes (Signed)
 Orders placed for future labs.

## 2024-09-17 NOTE — Addendum Note (Signed)
 Addended by: GLENDIA ALLENA RAMAN on: 09/17/2024 03:45 AM   Modules accepted: Orders

## 2024-11-28 ENCOUNTER — Other Ambulatory Visit: Payer: Self-pay | Admitting: Internal Medicine

## 2024-12-07 NOTE — Telephone Encounter (Signed)
 Detailed vm left asking pt to CB to schedule an appt

## 2024-12-16 ENCOUNTER — Ambulatory Visit: Admitting: Internal Medicine

## 2024-12-16 VITALS — BP 126/78 | HR 84 | Temp 97.8°F | Ht 63.0 in | Wt 210.0 lb

## 2024-12-16 DIAGNOSIS — K219 Gastro-esophageal reflux disease without esophagitis: Secondary | ICD-10-CM

## 2024-12-16 DIAGNOSIS — E538 Deficiency of other specified B group vitamins: Secondary | ICD-10-CM

## 2024-12-16 DIAGNOSIS — Z23 Encounter for immunization: Secondary | ICD-10-CM

## 2024-12-16 DIAGNOSIS — E7800 Pure hypercholesterolemia, unspecified: Secondary | ICD-10-CM

## 2024-12-16 LAB — CBC WITH DIFFERENTIAL/PLATELET
Basophils Absolute: 0 10*3/uL (ref 0.0–0.1)
Basophils Relative: 0.5 % (ref 0.0–3.0)
Eosinophils Absolute: 0.1 10*3/uL (ref 0.0–0.7)
Eosinophils Relative: 1.8 % (ref 0.0–5.0)
HCT: 38.4 % (ref 36.0–46.0)
Hemoglobin: 12.7 g/dL (ref 12.0–15.0)
Lymphocytes Relative: 32.6 % (ref 12.0–46.0)
Lymphs Abs: 1.7 10*3/uL (ref 0.7–4.0)
MCHC: 33.1 g/dL (ref 30.0–36.0)
MCV: 88.8 fl (ref 78.0–100.0)
Monocytes Absolute: 0.4 10*3/uL (ref 0.1–1.0)
Monocytes Relative: 7.4 % (ref 3.0–12.0)
Neutro Abs: 3 10*3/uL (ref 1.4–7.7)
Neutrophils Relative %: 57.7 % (ref 43.0–77.0)
Platelets: 327 10*3/uL (ref 150.0–400.0)
RBC: 4.32 Mil/uL (ref 3.87–5.11)
RDW: 13.7 % (ref 11.5–15.5)
WBC: 5.3 10*3/uL (ref 4.0–10.5)

## 2024-12-16 LAB — BASIC METABOLIC PANEL WITH GFR
BUN: 14 mg/dL (ref 6–23)
CO2: 28 meq/L (ref 19–32)
Calcium: 9.3 mg/dL (ref 8.4–10.5)
Chloride: 104 meq/L (ref 96–112)
Creatinine, Ser: 0.73 mg/dL (ref 0.40–1.20)
GFR: 97.58 mL/min
Glucose, Bld: 91 mg/dL (ref 70–99)
Potassium: 4.1 meq/L (ref 3.5–5.1)
Sodium: 138 meq/L (ref 135–145)

## 2024-12-16 LAB — HEPATIC FUNCTION PANEL
ALT: 15 U/L (ref 3–35)
AST: 16 U/L (ref 5–37)
Albumin: 4.3 g/dL (ref 3.5–5.2)
Alkaline Phosphatase: 52 U/L (ref 39–117)
Bilirubin, Direct: 0.1 mg/dL (ref 0.1–0.3)
Total Bilirubin: 0.5 mg/dL (ref 0.2–1.2)
Total Protein: 6.9 g/dL (ref 6.0–8.3)

## 2024-12-16 LAB — TSH: TSH: 1.28 u[IU]/mL (ref 0.35–5.50)

## 2024-12-16 LAB — VITAMIN D 25 HYDROXY (VIT D DEFICIENCY, FRACTURES): VITD: 20.24 ng/mL — ABNORMAL LOW (ref 30.00–100.00)

## 2024-12-16 LAB — VITAMIN B12: Vitamin B-12: 469 pg/mL (ref 211–911)

## 2024-12-16 MED ORDER — PANTOPRAZOLE SODIUM 40 MG PO TBEC: 40.0000 mg | DELAYED_RELEASE_TABLET | Freq: Every day | ORAL | 1 refills | Status: AC

## 2024-12-16 NOTE — Progress Notes (Unsigned)
 "  Subjective:    Patient ID: Carrie [name], female    DOB: Feb 27, 1976, 49 y.o.   MRN: [id number]  Patient here for  Chief Complaint  Patient presents with   Medical Management of Chronic Issues    HPI Here for a scheduled follow up - follow up regarding hypercholesterolemia and GERD. Increased stress. Seeing Dr Chipper. On Trintellix.  Saw GI 03/21/23 - recommended EGD and colonoscopy. EGD 05/20/23- few gastric polyps. Colonoscopy - two 8-10 mm polyps in the sigmoid colon and at the hepatic flexure, diverticulosis, one 25mm polyp in the ascending colon and non bleeding external and internal hemorrhoids. F/u colonoscopy 09/11/23 - one 20 mm polyp in the ascending colon, non bleeding external and internal hemorrhoids. Recommended for her to be checked for celiac - negative.    Past Medical History:  Diagnosis Date   Abnormal Pap smear    Allergy    Anemia    Anxiety    Arthritis    t-12   Asthma    exercised induced   Chronic kidney disease    1998 Mono kidney   Depression    GERD (gastroesophageal reflux disease)    Headache(784.0)    History of sensory changes 08/09/2020   Mononucleosis 1998   Oxygen deficiency    PONV (postoperative nausea and vomiting)    takes awhile to wake up   Seizures (HCC)    UTI (urinary tract infection)    Past Surgical History:  Procedure Laterality Date   CARPAL TUNNEL RELEASE Bilateral 2016   CESAREAN SECTION N/A 10/30/2013   Procedure: CESAREAN SECTION;  Surgeon: Charlie CHRISTELLA Croak, MD;  Location: [facility];  Service: Obstetrics;  Laterality: N/A;  PRIMARY   CHOLECYSTECTOMY N/A 10/24/2017   Procedure: LAPAROSCOPIC CHOLECYSTECTOMY;  Surgeon: Wonda Charlie BRAVO, MD;  Location: [facility];  Service: General;  Laterality: N/A;   COLONOSCOPY WITH PROPOFOL  N/A 09/11/2023   Procedure: COLONOSCOPY WITH PROPOFOL ;  Surgeon: Shila Gustav GAILS, MD;  Location: [facility];  Service: Gastroenterology;  Laterality: N/A;   COLPOSCOPY W/ BIOPSY / CURETTAGE     DE  QUERVAIN'S RELEASE Left 2016   DILATION AND CURETTAGE OF UTERUS     ENDOSCOPIC MUCOSAL RESECTION N/A 09/11/2023   Procedure: ENDOSCOPIC MUCOSAL RESECTION;  Surgeon: Shila Gustav GAILS, MD;  Location: [facility];  Service: Gastroenterology;  Laterality: N/A;   FOOT FRACTURE SURGERY Left    HAND SURGERY Left    Fracture repair   HEMOSTASIS CLIP PLACEMENT  09/11/2023   Procedure: HEMOSTASIS CLIP PLACEMENT;  Surgeon: Shila Gustav GAILS, MD;  Location: [facility];  Service: Gastroenterology;;   IVF     POLYPECTOMY  09/11/2023   Procedure: POLYPECTOMY;  Surgeon: Shila Gustav GAILS, MD;  Location: [facility];  Service: Gastroenterology;;   SUBMUCOSAL LIFTING INJECTION  09/11/2023   Procedure: SUBMUCOSAL LIFTING INJECTION;  Surgeon: Shila Gustav GAILS, MD;  Location: [facility];  Service: Gastroenterology;;   Family History  Problem Relation Age of Onset   Asthma Mother    Arthritis Mother    Diverticulitis Mother    Thyroid  disease Mother    GER disease Mother    Migraines Mother    Arthritis Father    Hypertension Father    Hyperlipidemia Father    Thyroid  disease Father    Allergic rhinitis Sister    Anemia Sister    Thyroid  disease Sister    Thyroid  disease Sister    Bipolar disorder Sister    Arrhythmia Sister  Ventricular Tachycardia   Healthy Brother    Diabetes Maternal Grandmother    Heart attack Maternal Grandmother    Alzheimer's disease Maternal Grandmother    Emphysema Maternal Grandfather    Breast cancer Paternal Grandmother    Heart disease Paternal Grandfather    Heart attack Paternal Grandfather    Liver disease Neg Hx    Esophageal cancer Neg Hx    Colon cancer Neg Hx    Crohn's disease Neg Hx    Rectal cancer Neg Hx    Stomach cancer Neg Hx    Social History   Socioeconomic History   Marital status: Married    Spouse name: Not on file   Number of children: 1   Years of education: Master's   Highest education level: Master's degree  (e.g., MA, MS, MEng, MEd, MSW, MBA)  Occupational History   Occupation: Air cabin crew    Comment: Facilities Manager  Tobacco Use   Smoking status: Never   Smokeless tobacco: Never  Vaping Use   Vaping status: Never Used  Substance and Sexual Activity   Alcohol use: No   Drug use: No   Sexual activity: Not Currently    Birth control/protection: None    Comment: couple is afraid  to mess something up  Other Topics Concern   Not on file  Social History Narrative   Not on file   Social Drivers of Health   Tobacco Use: Low Risk (12/16/2024)   Patient History    Smoking Tobacco Use: Never    Smokeless Tobacco Use: Never    Passive Exposure: Not on file  Financial Resource Strain: Low Risk (12/16/2024)   Overall Financial Resource Strain (CARDIA)    Difficulty of Paying Living Expenses: Not hard at all  Food Insecurity: No Food Insecurity (12/16/2024)   Epic    Worried About Radiation Protection Practitioner of Food in the Last Year: Never true    Ran Out of Food in the Last Year: Never true  Transportation Needs: No Transportation Needs (12/16/2024)   Epic    Lack of Transportation (Medical): No    Lack of Transportation (Non-Medical): No  Physical Activity: Insufficiently Active (12/16/2024)   Exercise Vital Sign    Days of Exercise per Week: 2 days    Minutes of Exercise per Session: 10 min  Stress: No Stress Concern Present (12/16/2024)   [employer] of Occupational Health - Occupational Stress Questionnaire    Feeling of Stress: Only a little  Social Connections: Moderately Integrated (12/16/2024)   Social Connection and Isolation Panel    Frequency of Communication with Friends and Family: Twice a week    Frequency of Social Gatherings with Friends and Family: Once a week    Attends Religious Services: 1 to 4 times per year    Active Member of [employer] or Organizations: No    Attends Banker Meetings: Not on file    Marital Status: Married  Depression (PHQ2-9): Low Risk  (12/16/2024)   Depression (PHQ2-9)    PHQ-2 Score: 3  Alcohol Screen: Low Risk (12/16/2024)   Alcohol Screen    Last Alcohol Screening Score (AUDIT): 1  Housing: Low Risk (12/16/2024)   Epic    Unable to Pay for Housing in the Last Year: No    Number of Times Moved in the Last Year: 0    Homeless in the Last Year: No  Utilities: Not on file  Health Literacy: Not on file     Review of Systems  Objective:     BP 126/78   Pulse 84   Temp 97.8 F (36.6 C) (Oral)   Ht 5' 3 (1.6 m)   Wt 210 lb (95.3 kg)   SpO2 99%   BMI 37.20 kg/m  Wt Readings from Last 3 Encounters:  12/16/24 210 lb (95.3 kg)  11/27/23 200 lb (90.7 kg)  09/11/23 200 lb (90.7 kg)    Physical Exam  {Perform Simple Foot Exam  Perform Detailed exam:1} {Insert foot Exam (Optional):30965}   Outpatient Encounter Medications as of 12/16/2024  Medication Sig   acetaminophen  (TYLENOL ) 500 MG tablet Take 1,000-1,500 mg by mouth every 6 (six) hours as needed for moderate pain or headache.    amphetamine-dextroamphetamine (ADDERALL) 20 MG tablet Take 20 mg by mouth 2 (two) times daily.   buPROPion ER (WELLBUTRIN SR) 100 MG 12 hr tablet Take 100 mg by mouth 2 (two) times daily.   ferrous sulfate 325 (65 FE) MG EC tablet Take 325 mg by mouth daily.   fluticasone  (FLONASE ) 50 MCG/ACT nasal spray PLACE 1 SPRAY INTO BOTH NOSTRILS AT BEDTIME.   levalbuterol  (XOPENEX  HFA) 45 MCG/ACT inhaler Inhale 1-2 puffs into the lungs every 6 (six) hours as needed for wheezing.   magnesium  oxide (MAG-OX) 400 (241.3 Mg) MG tablet TAKE 1 TABLET EVERY DAY   pantoprazole  (PROTONIX ) 40 MG tablet Take 1 tablet (40 mg total) by mouth daily.   propranolol  (INDERAL ) 20 MG tablet Take 1 tablet (20 mg total) by mouth 3 (three) times daily as needed.   Spacer/Aero-Holding Chambers (AEROCHAMBER MV) inhaler Use as instructed   SUMAtriptan  (IMITREX ) 100 MG tablet Take 100 mg by mouth as needed.   TRINTELLIX 20 MG TABS tablet Take 20 mg by mouth  daily.   Ubrogepant (UBRELVY) 100 MG TABS Take by mouth.   [DISCONTINUED] TRINTELLIX 10 MG TABS tablet Take by mouth. Takes 20 mg QD   famotidine  (PEPCID ) 10 MG tablet Take 10 mg by mouth at bedtime. Only in the morning. (Patient not taking: Reported on 12/16/2024)   [DISCONTINUED] CALCIUM PO Take 1 tablet by mouth daily.   [DISCONTINUED] Fremanezumab-vfrm (AJOVY) 225 MG/1.5ML SOAJ Inject 225 mg into the skin every 30 (thirty) days. (Patient not taking: Reported on 08/13/2023)   [DISCONTINUED] predniSONE  (DELTASONE ) 10 MG tablet Take 6 tablets x 1 day and then decrease by 1 tablet per day until down to zero mg   No facility-administered encounter medications on file as of 12/16/2024.     Lab Results  Component Value Date   WBC 7.1 11/27/2023   HGB 12.9 11/27/2023   HCT 39.1 11/27/2023   PLT 379.0 11/27/2023   GLUCOSE 92 11/27/2023   CHOL 216 (H) 11/27/2023   TRIG 122.0 11/27/2023   HDL 53.10 11/27/2023   LDLDIRECT 134.0 03/07/2022   LDLCALC 139 (H) 11/27/2023   ALT 21 11/27/2023   AST 20 11/27/2023   NA 137 11/27/2023   K 4.2 11/27/2023   CL 103 11/27/2023   CREATININE 0.68 11/27/2023   BUN 15 11/27/2023   CO2 26 11/27/2023   TSH 1.56 07/28/2023    No results found.     Assessment & Plan:  B12 deficiency  Hypercholesteremia  Need for influenza vaccination -     Flu vaccine trivalent PF, 6mos and older(Flulaval,Afluria,Fluarix,Fluzone)     Allena Hamilton, MD "

## 2025-03-21 ENCOUNTER — Encounter: Admitting: Internal Medicine
